# Patient Record
Sex: Male | Born: 1945
Health system: Southern US, Community
[De-identification: ages and names within clinical notes are randomized; demographics above are authoritative.]

## PROBLEM LIST (undated history)

## (undated) DIAGNOSIS — I2510 Atherosclerotic heart disease of native coronary artery without angina pectoris: Secondary | ICD-10-CM

## (undated) DIAGNOSIS — R42 Dizziness and giddiness: Secondary | ICD-10-CM

## (undated) DIAGNOSIS — M109 Gout, unspecified: Secondary | ICD-10-CM

## (undated) DIAGNOSIS — E669 Obesity, unspecified: Secondary | ICD-10-CM

## (undated) DIAGNOSIS — I10 Essential (primary) hypertension: Secondary | ICD-10-CM

## (undated) DIAGNOSIS — R011 Cardiac murmur, unspecified: Secondary | ICD-10-CM

## (undated) DIAGNOSIS — E119 Type 2 diabetes mellitus without complications: Secondary | ICD-10-CM

## (undated) DIAGNOSIS — E785 Hyperlipidemia, unspecified: Secondary | ICD-10-CM

## (undated) DIAGNOSIS — N529 Male erectile dysfunction, unspecified: Secondary | ICD-10-CM

## (undated) DIAGNOSIS — M1990 Unspecified osteoarthritis, unspecified site: Secondary | ICD-10-CM

## (undated) HISTORY — DX: Atherosclerotic heart disease of native coronary artery without angina pectoris: I25.10

## (undated) HISTORY — DX: Hyperlipidemia, unspecified: E78.5

## (undated) HISTORY — DX: Male erectile dysfunction, unspecified: N52.9

## (undated) HISTORY — DX: Obesity, unspecified: E66.9

## (undated) HISTORY — DX: Gout, unspecified: M10.9

## (undated) HISTORY — DX: Dizziness and giddiness: R42

## (undated) HISTORY — DX: Essential (primary) hypertension: I10

---

## 1977-11-14 HISTORY — PX: PILONIDAL CYST EXCISION: SHX744

## 1979-11-15 HISTORY — PX: CHOLECYSTECTOMY: SHX55

## 1999-11-15 HISTORY — PX: CORONARY ANGIOPLASTY WITH STENT PLACEMENT: SHX49

## 2013-11-14 HISTORY — PX: HERNIA REPAIR: SHX51

## 2014-05-22 ENCOUNTER — Encounter (INDEPENDENT_AMBULATORY_CARE_PROVIDER_SITE_OTHER): Payer: Self-pay | Admitting: Surgery

## 2014-06-10 ENCOUNTER — Ambulatory Visit (INDEPENDENT_AMBULATORY_CARE_PROVIDER_SITE_OTHER): Payer: BC Managed Care – PPO | Admitting: Surgery

## 2014-06-10 ENCOUNTER — Encounter (INDEPENDENT_AMBULATORY_CARE_PROVIDER_SITE_OTHER): Payer: Self-pay | Admitting: Surgery

## 2014-06-10 VITALS — BP 126/82 | HR 78 | Temp 97.0°F | Ht 70.0 in | Wt 290.0 lb

## 2014-06-10 DIAGNOSIS — K420 Umbilical hernia with obstruction, without gangrene: Secondary | ICD-10-CM | POA: Insufficient documentation

## 2014-06-10 DIAGNOSIS — K429 Umbilical hernia without obstruction or gangrene: Secondary | ICD-10-CM

## 2014-06-10 NOTE — Progress Notes (Signed)
General Surgery [facility]  Chief Complaint  Patient presents with  . New Evaluation    umbilical hernia - referral from Dr. Briscoe Deutscher    HISTORY: Patient is a 68 year old male referred by his primary care physician for evaluation of incarcerated umbilical hernia. Patient notes that this is been present for at least 4 years. He had previous surgery at the umbilicus related to his laparoscopic cholecystectomy from over 20 years ago. Over the past 4-5 years the patient has noted gradual enlargement of his umbilical hernia. Over the past year it has become no longer reducible. Patient has intermittent minor discomfort. No signs or symptoms of obstruction. He now presents for repair.  Past Medical History  Diagnosis Date  . CAD (coronary artery disease)   . Obesity   . Hyperlipidemia   . Hypertension   . Vertigo   . ED (erectile dysfunction)   . Gout     Current Outpatient Prescriptions  Medication Sig Dispense Refill  . aspirin 81 MG tablet Take 81 mg by mouth daily.      . Chromium 200 MCG CAPS Take by mouth.      Marland Kitchen econazole nitrate 1 % cream Apply topically daily.      . metoprolol succinate (TOPROL-XL) 50 MG 24 hr tablet Take 50 mg by mouth daily. Take with or immediately following a meal.      . nitroGLYCERIN (NITROSTAT) 0.4 MG SL tablet Place 0.4 mg under the tongue every 5 (five) minutes as needed for chest pain.      . rosuvastatin (CRESTOR) 10 MG tablet Take 10 mg by mouth daily.      . sildenafil (VIAGRA) 100 MG tablet Take 100 mg by mouth daily as needed for erectile dysfunction.      . valsartan-hydrochlorothiazide (DIOVAN-HCT) 160-25 MG per tablet Take 1 tablet by mouth daily.       No current facility-administered medications for this visit.    Allergies  Allergen Reactions  . Sulfa Antibiotics Hives    History reviewed. No pertinent family history.  History   Social History  . Marital Status: Married    Spouse Name: N/A    Number of  Children: N/A  . Years of Education: N/A   Social History Main Topics  . Smoking status: Former Research scientist (life sciences)  . Smokeless tobacco: None  . Alcohol Use: No  . Drug Use: No  . Sexual Activity: None   Other Topics Concern  . None   Social History Narrative  . None    REVIEW OF SYSTEMS - PERTINENT POSITIVES ONLY: Minimal discomfort. Not reducible. No signs of obstruction. No history of inguinal hernia.  EXAM: Filed Vitals:   06/10/14 1351  BP: 126/82  Pulse: 78  Temp: 97 F (36.1 C)    GENERAL: well-developed, well-nourished, no acute distress HEENT: normocephalic; pupils equal and reactive; sclerae clear; dentition good; mucous membranes moist NECK:  No palpable masses in the thyroid bed; symmetric on extension; no palpable anterior or posterior cervical lymphadenopathy; no supraclavicular masses; no tenderness CHEST: clear to auscultation bilaterally without rales, rhonchi, or wheezes CARDIAC: regular rate and rhythm without significant murmur; peripheral pulses are full ABDOMEN: soft without distension; bowel sounds present; no mass; no hepatosplenomegaly; obvious umbilical hernia of moderate size, not reducible, likely containing incarcerated omentum, difficult to estimate fascial defect but probably 3 cm in diameter EXT:  non-tender without edema; no deformity NEURO: no gross focal deficits; no sign of tremor   LABORATORY RESULTS: See  [facility] ([facility]) for most recent results  RADIOLOGY RESULTS: See [facility] ([place]) for most recent results  IMPRESSION: Incarcerated umbilical hernia  PLAN: I discussed the above findings with the patient. We reviewed records from his primary care physician. I have given him written literature to review at home.  I have recommended repair of umbilical hernia with mesh patch as an outpatient surgical procedure. We discussed the risk and benefits of the procedure. I explained the possibility of recurrence is less than 5%.  We discussed restrictions on his activities following the procedure. We will keep him out of work approximately 3 weeks following the procedure.  The risks and benefits of the procedure have been discussed at length with the patient.  The patient understands the proposed procedure, potential alternative treatments, and the course of recovery to be expected.  All of the patient's questions have been answered at this time.  The patient wishes to proceed with surgery.  Earnstine Regal, MD, [facility]  Primary Care Physician: Abigail Miyamoto, MD

## 2014-06-10 NOTE — Patient Instructions (Signed)
[facility]  HERNIA REPAIR POST OP INSTRUCTIONS  Always review your discharge instruction sheet given to you by the facility where your surgery was performed.  1. A  prescription for pain medication may be given to you upon discharge.  Take your pain medication as prescribed.  If narcotic pain medicine is not needed, then you may take acetaminophen (Tylenol) or ibuprofen (Advil) as needed.  2. Take your usually prescribed medications unless otherwise directed.  3. If you need a refill on your pain medication, please contact your pharmacy.  They will contact our office to request authorization. Prescriptions will not be filled after 5 pm daily or on weekends.  4. You should follow a light diet the first 24 hours after arrival home, such as soup and crackers or toast.  Be sure to include plenty of fluids daily.  Resume your normal diet the day after surgery.  5. Most patients will experience some swelling and bruising around the surgical site.  Ice packs and reclining will help.  Swelling and bruising can take several days to resolve.   6. It is common to experience some constipation if taking pain medication after surgery.  Increasing fluid intake and taking a stool softener (such as Colace) will usually help or prevent this problem from occurring.  A mild laxative (Milk of Magnesia or Miralax) should be taken according to package directions if there are no bowel movements after 48 hours.  7. Unless discharge instructions indicate otherwise, you may remove your bandages 24-48 hours after surgery, and you may shower at that time.  You may have steri-strips (small skin tapes) in place directly over the incision.  These strips should be left on the skin for 7-10 days.  If your surgeon used skin glue on the incision, you may shower in 24 hours.  The glue will flake off over the next 2-3 weeks.  Any sutures or staples will be removed at the office during your follow-up  visit.  8. ACTIVITIES:  You may resume regular (light) daily activities beginning the next day-such as daily self-care, walking, climbing stairs-gradually increasing activities as tolerated.  You may have sexual intercourse when it is comfortable.  Refrain from any heavy lifting or straining until approved by your doctor.  You may drive when you are no longer taking prescription pain medication, you can comfortably wear a seatbelt, and you can safely maneuver your car and apply brakes.  9. You should see your doctor in the office for a follow-up appointment approximately 2-3 weeks after your surgery.  Make sure that you call for this appointment within a day or two after you arrive home to insure a convenient appointment time. 10.   WHEN TO CALL YOUR DOCTOR: 1. Fever greater than 101.0 2. Inability to urinate 3. Persistent nausea and/or vomiting 4. Extreme swelling or bruising 5. Continued bleeding from incision 6. Increased pain, redness, or drainage from the incision  The clinic staff is available to answer your questions during regular business hours.  Please don't hesitate to call and ask to speak to one of the nurses for clinical concerns.  If you have a medical emergency, go to the nearest emergency room or call 911.  A surgeon from [facility] is always on call for the hospital.   [facility] [address], [address], [place], [place]  [address]  [phone] ? [phone] ? FAX [phone]  [email or web address]   

## 2014-07-10 ENCOUNTER — Telehealth (INDEPENDENT_AMBULATORY_CARE_PROVIDER_SITE_OTHER): Payer: Self-pay

## 2014-07-10 ENCOUNTER — Other Ambulatory Visit (INDEPENDENT_AMBULATORY_CARE_PROVIDER_SITE_OTHER): Payer: Self-pay

## 2014-07-10 DIAGNOSIS — K420 Umbilical hernia with obstruction, without gangrene: Secondary | ICD-10-CM

## 2014-07-10 MED ORDER — HYDROCODONE-ACETAMINOPHEN 5-325 MG PO TABS: 1.0000 | ORAL_TABLET | ORAL | Status: DC | PRN

## 2014-07-10 NOTE — Telephone Encounter (Signed)
LMOM with po appt date.

## 2014-07-14 ENCOUNTER — Telehealth (INDEPENDENT_AMBULATORY_CARE_PROVIDER_SITE_OTHER): Payer: Self-pay

## 2014-07-14 NOTE — Telephone Encounter (Signed)
Pt called stating with in the last 2 hours his right testicle is swelling and slightly painful. No fever. No change of color. Reviewed with Dr Harlow Asa. Per Dr Gala Lewandowsky request pt directed to go to his PCP Dr Maceo Pro to have this evaluated. Per Dr Harlow Asa pt advised this is not a typical complaint after umbilical hernia repair. Pt states he understands and will see Dr Maceo Pro.

## 2014-07-28 ENCOUNTER — Encounter (INDEPENDENT_AMBULATORY_CARE_PROVIDER_SITE_OTHER): Payer: BC Managed Care – PPO | Admitting: Surgery

## 2014-12-02 ENCOUNTER — Ambulatory Visit: Payer: Self-pay | Admitting: Physical Therapy

## 2014-12-04 ENCOUNTER — Ambulatory Visit: Payer: BLUE CROSS/BLUE SHIELD | Attending: Physician Assistant | Admitting: Physical Therapy

## 2014-12-04 DIAGNOSIS — R200 Anesthesia of skin: Secondary | ICD-10-CM | POA: Insufficient documentation

## 2014-12-04 DIAGNOSIS — M545 Low back pain: Secondary | ICD-10-CM | POA: Insufficient documentation

## 2014-12-16 ENCOUNTER — Ambulatory Visit: Payer: BLUE CROSS/BLUE SHIELD | Attending: Physician Assistant | Admitting: Physical Therapy

## 2014-12-16 DIAGNOSIS — R200 Anesthesia of skin: Secondary | ICD-10-CM | POA: Insufficient documentation

## 2014-12-16 DIAGNOSIS — M545 Low back pain: Secondary | ICD-10-CM | POA: Insufficient documentation

## 2014-12-18 ENCOUNTER — Ambulatory Visit: Payer: BLUE CROSS/BLUE SHIELD | Admitting: Physical Therapy

## 2014-12-18 DIAGNOSIS — M545 Low back pain: Secondary | ICD-10-CM | POA: Diagnosis not present

## 2014-12-23 ENCOUNTER — Ambulatory Visit: Payer: BLUE CROSS/BLUE SHIELD | Admitting: Physical Therapy

## 2014-12-23 DIAGNOSIS — M545 Low back pain: Secondary | ICD-10-CM | POA: Diagnosis not present

## 2014-12-30 ENCOUNTER — Encounter: Payer: BLUE CROSS/BLUE SHIELD | Admitting: Physical Therapy

## 2015-01-20 ENCOUNTER — Encounter: Payer: Self-pay | Admitting: *Deleted

## 2015-01-20 ENCOUNTER — Ambulatory Visit: Payer: BLUE CROSS/BLUE SHIELD | Attending: Physician Assistant | Admitting: *Deleted

## 2015-01-20 DIAGNOSIS — M545 Low back pain, unspecified: Secondary | ICD-10-CM

## 2015-01-20 DIAGNOSIS — R200 Anesthesia of skin: Secondary | ICD-10-CM | POA: Diagnosis not present

## 2015-01-20 NOTE — Therapy (Addendum)
[facility] [address] [place], [place], [address] Phone: [phone]   Fax:  [phone]  Physical Therapy Treatment  Patient Details  Name: John Wilson MRN: [id number] Date of Birth: 05/02/1946 Referring Provider:  Fried, Robert, MD  Encounter Date: 01/20/2015    Past Medical History:  Diagnosis Date  . CAD (coronary artery disease)   . ED (erectile dysfunction)   . Gout   . Hyperlipidemia   . Hypertension   . Obesity   . Vertigo     Past Surgical History:  Procedure Laterality Date  . NO PAST SURGERIES      There were no vitals taken for this visit.  Visit Diagnosis:  Right-sided low back pain without sciatica                               PT Long Term Goals - 01/20/15 1727      PT LONG TERM GOAL #1   Title Demonstrate and verbalize techniques to reduce the risk of re-injury to include info on physical activity   Status On-going     PT LONG TERM GOAL #2   Title Independent with advanced HEP   Status On-going     PT LONG TERM GOAL #3   Title Perform ADLs with pain not> 3/10   Status On-going     PT LONG TERM GOAL #4   Title tolerate standing for 20 mins with pain not >3/10   Status On-going     PT LONG TERM GOAL #5   Title Eliminate RT LE symptoms   Status On-going               Problem List Patient Active Problem List   Diagnosis Date Noted  . Incarcerated umbilical hernia 06/10/2014    APPLEGATE, CHAD, PTA 10/10/2016, 5:56 PM  [facility] [address] [place], [place], [address] Phone: [phone]   Fax:  [phone]  PHYSICAL THERAPY DISCHARGE SUMMARY  Visits from Start of Care: 5.  Current functional level related to goals / functional outcomes: Please see above.   Remaining deficits: Continued pain.   Education / Equipment: HEP. Plan: Patient agrees to discharge.  Patient goals were not met. Patient  is being discharged due to not returning since the last visit.  ?????         Chad Applegate MPT   

## 2015-01-27 ENCOUNTER — Encounter: Payer: BLUE CROSS/BLUE SHIELD | Admitting: *Deleted

## 2015-09-29 ENCOUNTER — Encounter: Payer: Self-pay | Admitting: Cardiology

## 2015-09-29 DIAGNOSIS — E119 Type 2 diabetes mellitus without complications: Secondary | ICD-10-CM | POA: Diagnosis not present

## 2015-09-29 DIAGNOSIS — Z125 Encounter for screening for malignant neoplasm of prostate: Secondary | ICD-10-CM | POA: Diagnosis not present

## 2015-09-29 DIAGNOSIS — Z0000 Encounter for general adult medical examination without abnormal findings: Secondary | ICD-10-CM | POA: Diagnosis not present

## 2015-09-29 DIAGNOSIS — I10 Essential (primary) hypertension: Secondary | ICD-10-CM | POA: Diagnosis not present

## 2015-09-29 DIAGNOSIS — Z23 Encounter for immunization: Secondary | ICD-10-CM | POA: Diagnosis not present

## 2015-09-29 DIAGNOSIS — I2510 Atherosclerotic heart disease of native coronary artery without angina pectoris: Secondary | ICD-10-CM | POA: Diagnosis not present

## 2015-09-29 DIAGNOSIS — L309 Dermatitis, unspecified: Secondary | ICD-10-CM | POA: Diagnosis not present

## 2015-10-01 ENCOUNTER — Other Ambulatory Visit (HOSPITAL_BASED_OUTPATIENT_CLINIC_OR_DEPARTMENT_OTHER): Payer: Self-pay | Admitting: Physician Assistant

## 2015-10-01 DIAGNOSIS — Z136 Encounter for screening for cardiovascular disorders: Secondary | ICD-10-CM

## 2015-10-07 ENCOUNTER — Ambulatory Visit (HOSPITAL_BASED_OUTPATIENT_CLINIC_OR_DEPARTMENT_OTHER)
Admission: RE | Admit: 2015-10-07 | Discharge: 2015-10-07 | Disposition: A | Discharge status: Home / Self Care | Payer: Medicare Other | Source: Ambulatory Visit | Attending: Physician Assistant | Admitting: Physician Assistant

## 2015-10-07 DIAGNOSIS — I77811 Abdominal aortic ectasia: Secondary | ICD-10-CM | POA: Diagnosis not present

## 2015-10-07 DIAGNOSIS — Z136 Encounter for screening for cardiovascular disorders: Secondary | ICD-10-CM | POA: Insufficient documentation

## 2015-11-15 DIAGNOSIS — J0140 Acute pansinusitis, unspecified: Secondary | ICD-10-CM | POA: Diagnosis not present

## 2015-11-15 DIAGNOSIS — H10023 Other mucopurulent conjunctivitis, bilateral: Secondary | ICD-10-CM | POA: Diagnosis not present

## 2015-11-23 DIAGNOSIS — L570 Actinic keratosis: Secondary | ICD-10-CM | POA: Diagnosis not present

## 2015-11-23 DIAGNOSIS — B354 Tinea corporis: Secondary | ICD-10-CM | POA: Diagnosis not present

## 2015-12-28 DIAGNOSIS — B354 Tinea corporis: Secondary | ICD-10-CM | POA: Diagnosis not present

## 2016-01-06 DIAGNOSIS — Z79899 Other long term (current) drug therapy: Secondary | ICD-10-CM | POA: Diagnosis not present

## 2016-02-08 DIAGNOSIS — Z79899 Other long term (current) drug therapy: Secondary | ICD-10-CM | POA: Diagnosis not present

## 2016-02-08 DIAGNOSIS — B354 Tinea corporis: Secondary | ICD-10-CM | POA: Diagnosis not present

## 2016-02-08 DIAGNOSIS — B351 Tinea unguium: Secondary | ICD-10-CM | POA: Diagnosis not present

## 2016-03-23 ENCOUNTER — Encounter: Payer: Self-pay | Admitting: Cardiology

## 2016-03-23 DIAGNOSIS — E119 Type 2 diabetes mellitus without complications: Secondary | ICD-10-CM | POA: Diagnosis not present

## 2016-03-23 DIAGNOSIS — E782 Mixed hyperlipidemia: Secondary | ICD-10-CM | POA: Diagnosis not present

## 2016-03-23 DIAGNOSIS — Z7984 Long term (current) use of oral hypoglycemic drugs: Secondary | ICD-10-CM | POA: Diagnosis not present

## 2016-03-23 DIAGNOSIS — I2510 Atherosclerotic heart disease of native coronary artery without angina pectoris: Secondary | ICD-10-CM | POA: Diagnosis not present

## 2016-03-23 DIAGNOSIS — I10 Essential (primary) hypertension: Secondary | ICD-10-CM | POA: Diagnosis not present

## 2016-04-13 DIAGNOSIS — B351 Tinea unguium: Secondary | ICD-10-CM | POA: Diagnosis not present

## 2016-04-13 DIAGNOSIS — Z79899 Other long term (current) drug therapy: Secondary | ICD-10-CM | POA: Diagnosis not present

## 2016-05-12 ENCOUNTER — Ambulatory Visit (INDEPENDENT_AMBULATORY_CARE_PROVIDER_SITE_OTHER): Payer: Medicare Other | Admitting: Cardiology

## 2016-05-12 ENCOUNTER — Encounter: Payer: Self-pay | Admitting: Cardiology

## 2016-05-12 VITALS — BP 146/80 | HR 50 | Ht 70.0 in | Wt 266.6 lb

## 2016-05-12 DIAGNOSIS — E785 Hyperlipidemia, unspecified: Secondary | ICD-10-CM

## 2016-05-12 DIAGNOSIS — I2510 Atherosclerotic heart disease of native coronary artery without angina pectoris: Secondary | ICD-10-CM | POA: Diagnosis not present

## 2016-05-12 DIAGNOSIS — I2583 Coronary atherosclerosis due to lipid rich plaque: Principal | ICD-10-CM

## 2016-05-12 DIAGNOSIS — R0789 Other chest pain: Secondary | ICD-10-CM | POA: Diagnosis not present

## 2016-05-12 DIAGNOSIS — I10 Essential (primary) hypertension: Secondary | ICD-10-CM | POA: Diagnosis not present

## 2016-05-12 NOTE — Progress Notes (Signed)
Cardiology Office Note    Date:  05/12/2016   ID:  John Wilson, DOB [birth year], MRN [id number]  PCP:  Beatris Si  Cardiologist:   Candee Furbish, MD     History of Present Illness:  John Wilson is a 70 y.o. male here for evaluation of coronary artery disease. Has a history of bare-metal stent to RCA, large dominant, and circumflex 2.5 x 13 mm in 2003 following a highly abnormal inferior wall stress test with ischemia. Was previously seen by a cardiologist in [place].  Has diabetes, hypertension, hyperlipidemia. I saw him previously approximately 6 years ago.  Overall doing well without any anginal symptoms, no syncopal, no bleeding, no orthopnea, no PND. However, he does sometimes has electrical shock, fleeting discomfort left flank lasting seconds duration.  No pain in left arm as prior angina.   He has several acres of land in [place].    Past Medical History  Diagnosis Date  . CAD (coronary artery disease)   . Obesity   . Hyperlipidemia   . Hypertension   . Vertigo   . ED (erectile dysfunction)   . Gout     Past Surgical History  Procedure Laterality Date  . No past surgeries      Current Medications: Outpatient Prescriptions Prior to Visit  Medication Sig Dispense Refill  . HYDROcodone-acetaminophen (NORCO) 5-325 MG per tablet Take 1-2 tablets by mouth every 4 (four) hours as needed for moderate pain or severe pain. 30 tablet 0  . metoprolol succinate (TOPROL-XL) 50 MG 24 hr tablet Take 50 mg by mouth daily. Take with or immediately following a meal.    . nitroGLYCERIN (NITROSTAT) 0.4 MG SL tablet Place 0.4 mg under the tongue every 5 (five) minutes as needed for chest pain.    . sildenafil (VIAGRA) 100 MG tablet Take 100 mg by mouth daily as needed for erectile dysfunction.    Marland Kitchen aspirin 81 MG tablet Take 81 mg by mouth daily.    . Chromium 200 MCG CAPS Take by mouth.    Marland Kitchen econazole nitrate 1 % cream Apply topically daily.    .  rosuvastatin (CRESTOR) 10 MG tablet Take 10 mg by mouth daily.    . valsartan-hydrochlorothiazide (DIOVAN-HCT) 160-25 MG per tablet Take 1 tablet by mouth daily.     No facility-administered medications prior to visit.     Allergies:   Sulfa antibiotics   Social History   Social History  . Marital Status: Married    Spouse Name: N/A  . Number of Children: N/A  . Years of Education: N/A   Social History Main Topics  . Smoking status: Former Research scientist (life sciences)  . Smokeless tobacco: None  . Alcohol Use: No  . Drug Use: No  . Sexual Activity: Not Asked   Other Topics Concern  . None   Social History Narrative     Family History:  No early family history of CAD   ROS:   Please see the history of present illness.   Positive for snoring ROS All other systems reviewed and are negative.   PHYSICAL EXAM:   VS:  BP 146/80 mmHg  Pulse 50  Ht 5\' 10"  (1.778 m)  Wt 266 lb 9.6 oz (120.929 kg)  BMI 38.25 kg/m2   GEN: Well nourished, well developed, in no acute distress HEENT: normal Neck: no JVD, carotid bruits, or masses Cardiac: RRR; no murmurs, rubs, or gallops,no edema  Respiratory:  clear to auscultation bilaterally, normal work of breathing  GI: soft, nontender, nondistended, + BS, overweight MS: no deformity or atrophy Skin: warm and dry, no rash Neuro:  Alert and Oriented x 3, Strength and sensation are intact Psych: euthymic mood, full affect  Wt Readings from Last 3 Encounters:  05/12/16 266 lb 9.6 oz (120.929 kg)  06/10/14 290 lb (131.543 kg)      Studies/Labs Reviewed:    EKG:   EKG was ordered today-05/12/16-sinus bradycardia rate 50 with no other significant abnormalities. Personally viewed-EKG personally reviewed from 09/29/15 shows sinus rhythm, poor R-wave progression/sinus bradycardia heart rate 52 with no other significant abnormalities.  Recent Labs: No results found for requested labs within last 365 days.   Lipid Panel No results found for: CHOL, TRIG, HDL,  CHOLHDL, VLDL, LDLCALC, LDLDIRECT  Additional studies/ records that were reviewed today include:   Hemoglobin A1c 6.5, creatinine 1.01, sodium 140 potassium 3.8, total cholesterol 179, HDL 41, LDL 104, triglycerides 169    ASSESSMENT:    1. Coronary artery disease due to lipid rich plaque   2. Atypical chest pain   3. Essential hypertension   4. Hyperlipidemia      PLAN:  In order of problems listed above:  Coronary artery disease  - Has both RCA as well as circumflex stent placed in 2003.  - Stable, no anginal like symptoms currently.  - He remembers that his RCA, a staged procedure, was challenging because of the bend. The operator had to place 4 different pieces of equipment he states to be successful.  - It would be reasonable for him to follow-up in 1 year monitor or to call sooner if needed.  Atypical chest pain  - Electrical-like left flank discomfort shooting upwards to left axilla. Does not sound cardiac in origin. Continue to monitor. If symptoms worsen or become more worrisome or remind him of the left arm discomfort that he was having as prior angina, we will have low threshold for stress test.  Essential hypertension  - Currently controlled, medications reviewed.  Hyperlipidemia  - Continue wiatorvastatin 40 mg a day.  Diabetes  - On metformin 500 mg twice a day    Medication Adjustments/Labs and Tests Ordered: Current medicines are reviewed at length with the patient today.  Concerns regarding medicines are outlined above.  Medication changes, Labs and Tests ordered today are listed in the Patient Instructions below. Patient Instructions  Medication Instructions:  The current medical regimen is effective;  continue present plan and medications.  Follow-Up: Follow up in 1 year with Dr. Marlou Porch.  You will receive a letter in the mail 2 months before you are due.  Please call us when you receive this letter to schedule your follow up appointment.  If you  need a refill on your cardiac medications before your next appointment, please call your pharmacy.  Thank you for choosing [facility]!!           Signed, Candee Furbish, MD  05/12/2016 11:49 AM    [address] [place], [place],   [address] Phone: [phone]; Fax: [phone]

## 2016-05-12 NOTE — Patient Instructions (Signed)

## 2016-06-14 DIAGNOSIS — H8112 Benign paroxysmal vertigo, left ear: Secondary | ICD-10-CM | POA: Diagnosis not present

## 2016-10-20 DIAGNOSIS — I10 Essential (primary) hypertension: Secondary | ICD-10-CM | POA: Diagnosis not present

## 2016-10-20 DIAGNOSIS — M542 Cervicalgia: Secondary | ICD-10-CM | POA: Diagnosis not present

## 2016-10-20 DIAGNOSIS — I2510 Atherosclerotic heart disease of native coronary artery without angina pectoris: Secondary | ICD-10-CM | POA: Diagnosis not present

## 2016-10-20 DIAGNOSIS — E119 Type 2 diabetes mellitus without complications: Secondary | ICD-10-CM | POA: Diagnosis not present

## 2016-10-20 DIAGNOSIS — Z125 Encounter for screening for malignant neoplasm of prostate: Secondary | ICD-10-CM | POA: Diagnosis not present

## 2016-11-21 DIAGNOSIS — B354 Tinea corporis: Secondary | ICD-10-CM | POA: Diagnosis not present

## 2017-04-19 DIAGNOSIS — Z794 Long term (current) use of insulin: Secondary | ICD-10-CM | POA: Diagnosis not present

## 2017-04-19 DIAGNOSIS — I10 Essential (primary) hypertension: Secondary | ICD-10-CM | POA: Diagnosis not present

## 2017-04-19 DIAGNOSIS — I2510 Atherosclerotic heart disease of native coronary artery without angina pectoris: Secondary | ICD-10-CM | POA: Diagnosis not present

## 2017-04-19 DIAGNOSIS — E119 Type 2 diabetes mellitus without complications: Secondary | ICD-10-CM | POA: Diagnosis not present

## 2017-04-19 DIAGNOSIS — E782 Mixed hyperlipidemia: Secondary | ICD-10-CM | POA: Diagnosis not present

## 2017-04-19 DIAGNOSIS — Z0000 Encounter for general adult medical examination without abnormal findings: Secondary | ICD-10-CM | POA: Diagnosis not present

## 2017-05-16 DIAGNOSIS — H6591 Unspecified nonsuppurative otitis media, right ear: Secondary | ICD-10-CM | POA: Diagnosis not present

## 2017-05-16 DIAGNOSIS — R42 Dizziness and giddiness: Secondary | ICD-10-CM | POA: Diagnosis not present

## 2017-05-16 DIAGNOSIS — Z1159 Encounter for screening for other viral diseases: Secondary | ICD-10-CM | POA: Diagnosis not present

## 2017-05-22 DIAGNOSIS — H6981 Other specified disorders of Eustachian tube, right ear: Secondary | ICD-10-CM | POA: Diagnosis not present

## 2017-05-22 DIAGNOSIS — Z1159 Encounter for screening for other viral diseases: Secondary | ICD-10-CM | POA: Diagnosis not present

## 2017-06-14 DIAGNOSIS — R6884 Jaw pain: Secondary | ICD-10-CM | POA: Diagnosis not present

## 2017-07-03 IMAGING — US US AORTA SCREENING (MEDICARE)
1 series · 14 of 14 positions shown · non-contrast
Comparison: None.

CLINICAL DATA: Medicare screening exam for abdominal aortic
aneurysm.

EXAM:
ABDOMINAL AORTA SCREENING ULTRASOUND
TECHNIQUE: Ultrasound examination of the abdominal aorta was performed as a
screening evaluation for abdominal aortic aneurysm.

[Series 1: us aorta screening (medicare) · 0.31mm/px · 14 of 14 slices shown]
[im 1/14]
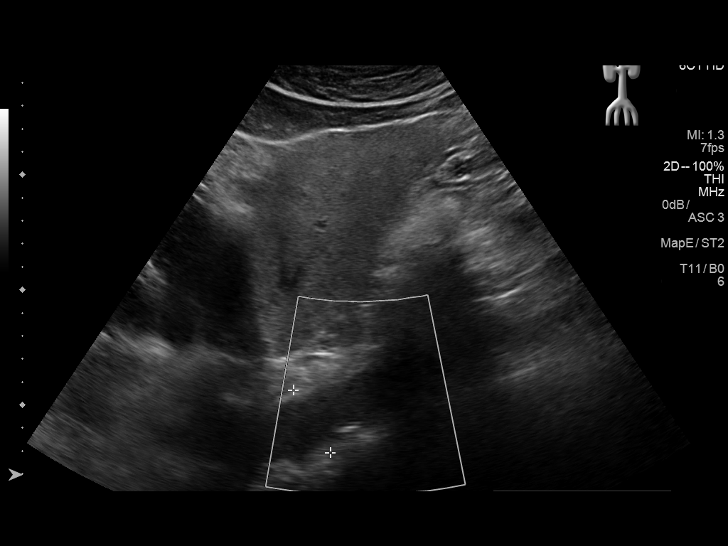
[im 2/14]
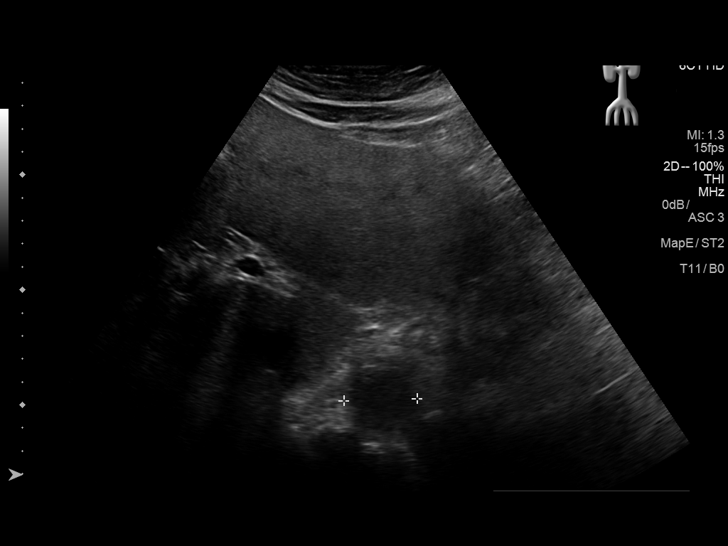
[im 3/14]
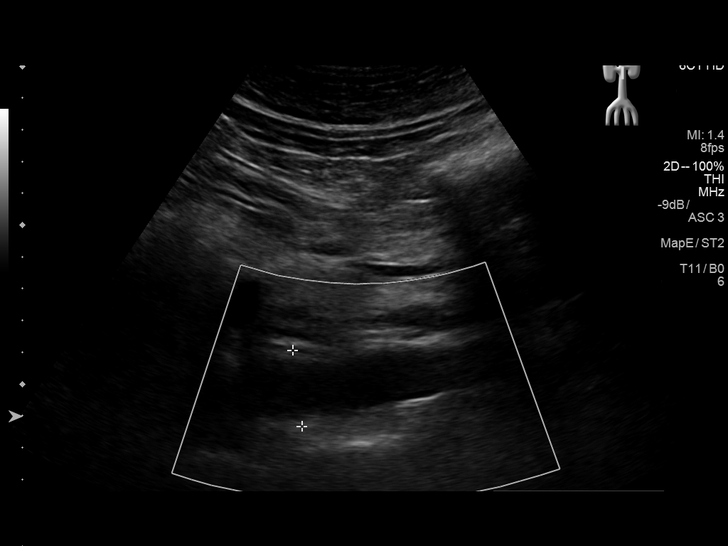
[im 4/14]
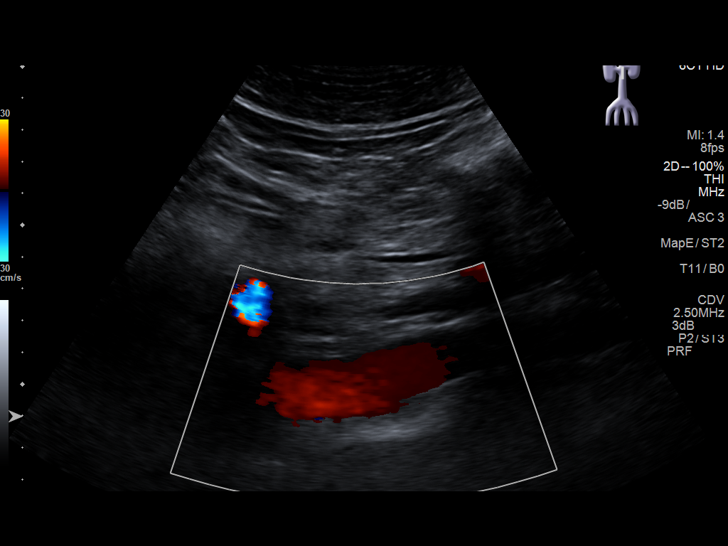
[im 5/14]
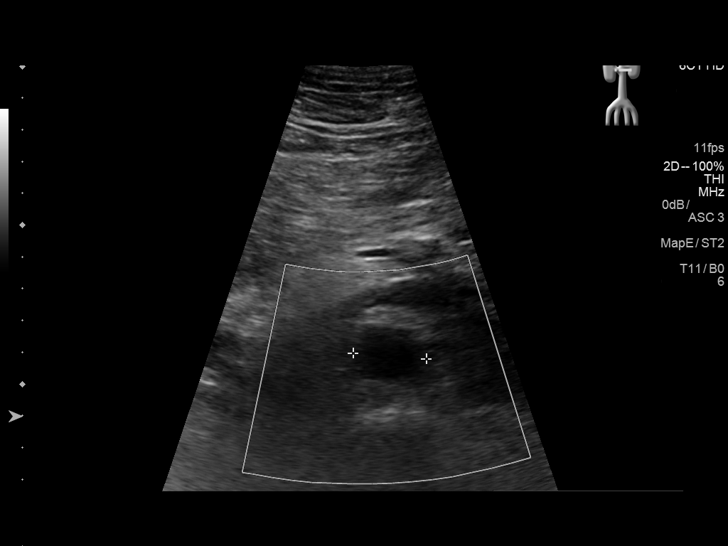
[im 6/14]
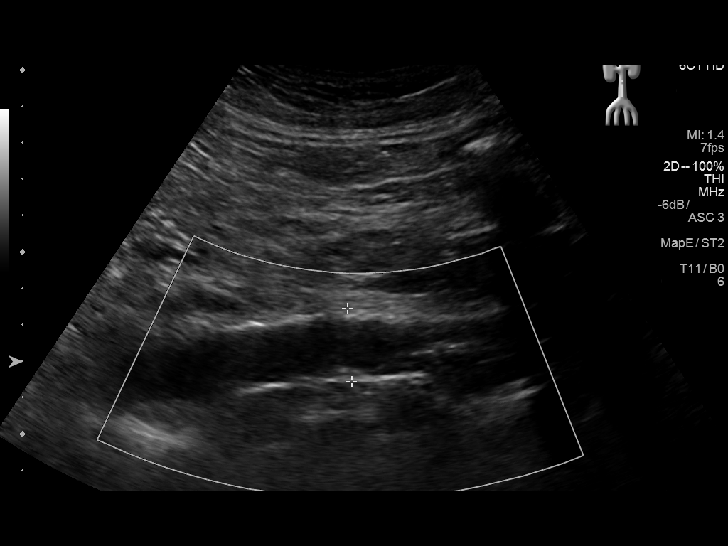
[im 7/14]
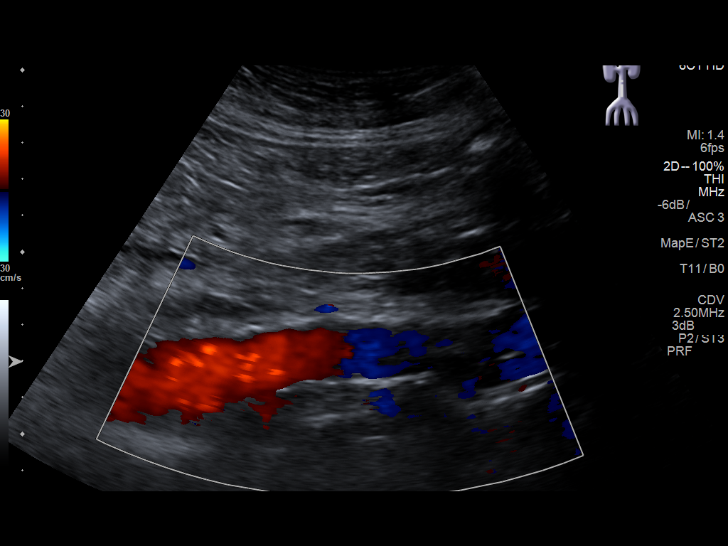
[im 8/14]
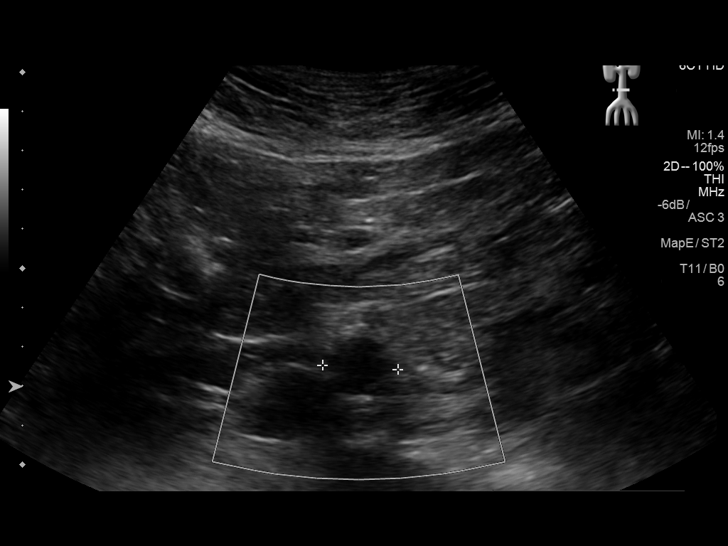
[im 9/14]
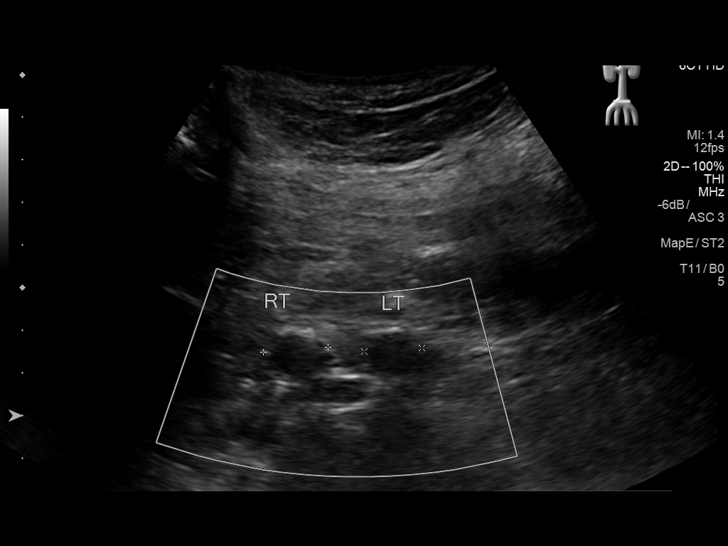
[im 10/14]
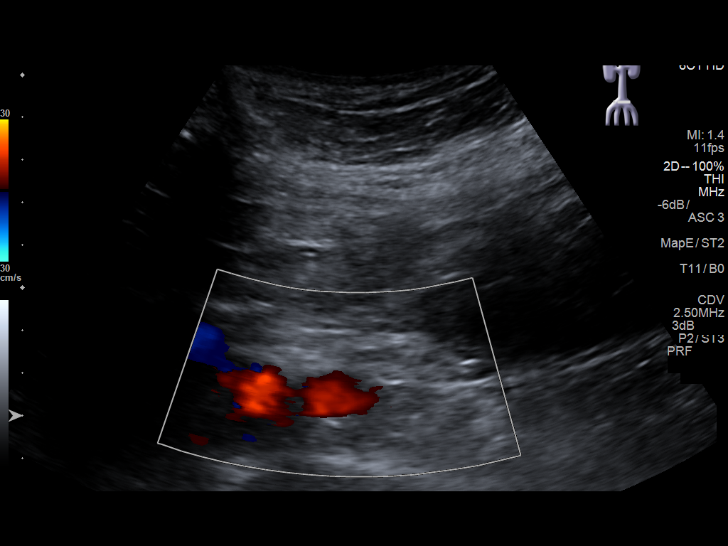
[im 11/14]
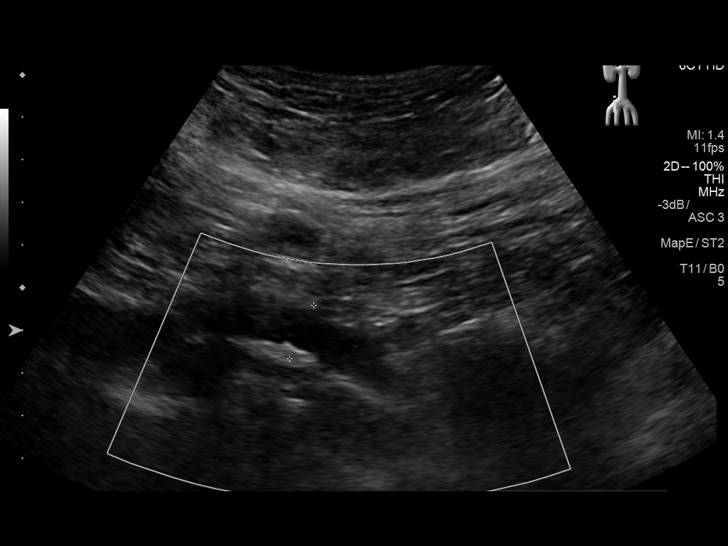
[im 12/14]
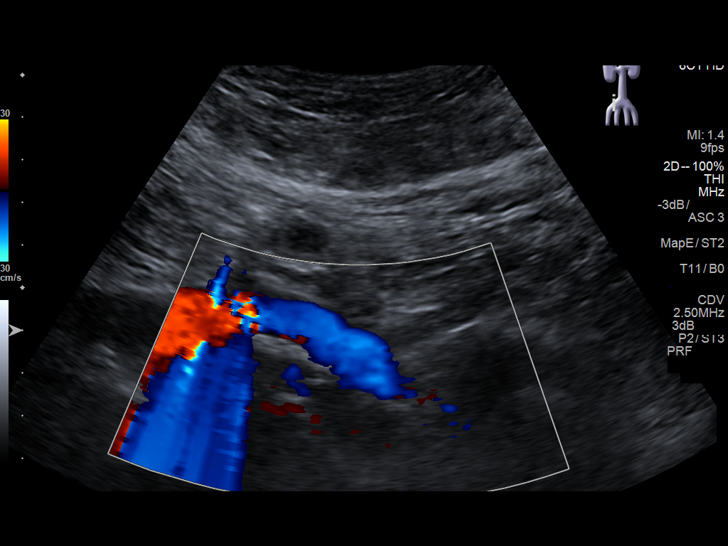
[im 13/14]
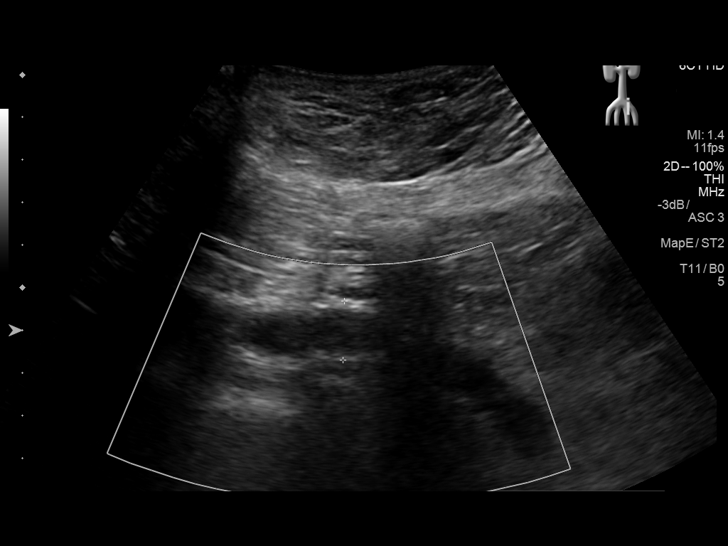
[im 14/14]
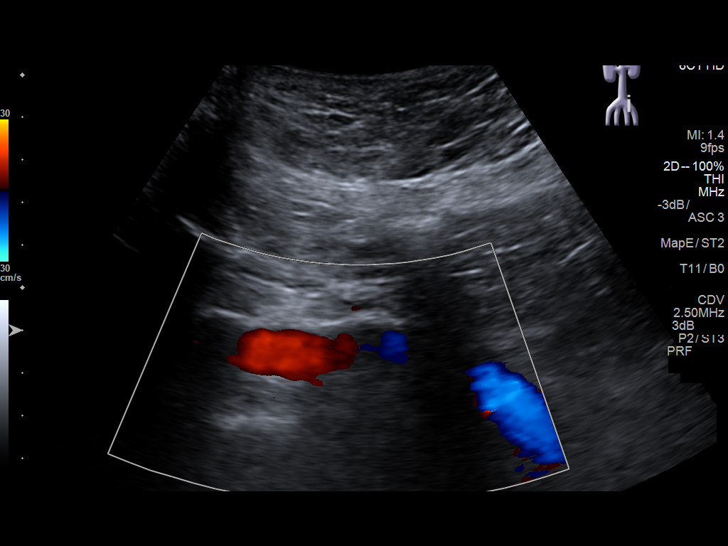

[14 of 14 positions shown; findings below may reference images not displayed]

FINDINGS: Abdominal Aorta

Mild abdominal aortic ectasia 3.2 cm

Maximum Diameter: 3.2 cm
IMPRESSION: Mild abdominal aortic ectasia at 3.2 cm. Recommend followup by
ultrasound in 3 years. This recommendation follows ACR consensus
guidelines: White Paper of the ACR Incidental Findings Committee II

## 2017-10-17 DIAGNOSIS — Z6836 Body mass index (BMI) 36.0-36.9, adult: Secondary | ICD-10-CM | POA: Diagnosis not present

## 2017-10-17 DIAGNOSIS — I10 Essential (primary) hypertension: Secondary | ICD-10-CM | POA: Diagnosis not present

## 2017-10-17 DIAGNOSIS — E782 Mixed hyperlipidemia: Secondary | ICD-10-CM | POA: Diagnosis not present

## 2017-10-17 DIAGNOSIS — E119 Type 2 diabetes mellitus without complications: Secondary | ICD-10-CM | POA: Diagnosis not present

## 2017-10-17 DIAGNOSIS — Z7984 Long term (current) use of oral hypoglycemic drugs: Secondary | ICD-10-CM | POA: Diagnosis not present

## 2017-10-17 DIAGNOSIS — I2510 Atherosclerotic heart disease of native coronary artery without angina pectoris: Secondary | ICD-10-CM | POA: Diagnosis not present

## 2017-11-03 DIAGNOSIS — E119 Type 2 diabetes mellitus without complications: Secondary | ICD-10-CM | POA: Diagnosis not present

## 2017-12-13 DIAGNOSIS — H52229 Regular astigmatism, unspecified eye: Secondary | ICD-10-CM | POA: Diagnosis not present

## 2017-12-18 DIAGNOSIS — Z0100 Encounter for examination of eyes and vision without abnormal findings: Secondary | ICD-10-CM | POA: Diagnosis not present

## 2017-12-27 DIAGNOSIS — R6889 Other general symptoms and signs: Secondary | ICD-10-CM | POA: Diagnosis not present

## 2018-04-10 DIAGNOSIS — I10 Essential (primary) hypertension: Secondary | ICD-10-CM | POA: Diagnosis not present

## 2018-04-10 DIAGNOSIS — E782 Mixed hyperlipidemia: Secondary | ICD-10-CM | POA: Diagnosis not present

## 2018-04-10 DIAGNOSIS — H6981 Other specified disorders of Eustachian tube, right ear: Secondary | ICD-10-CM | POA: Diagnosis not present

## 2018-04-10 DIAGNOSIS — I2510 Atherosclerotic heart disease of native coronary artery without angina pectoris: Secondary | ICD-10-CM | POA: Diagnosis not present

## 2018-04-10 DIAGNOSIS — Z7984 Long term (current) use of oral hypoglycemic drugs: Secondary | ICD-10-CM | POA: Diagnosis not present

## 2018-04-10 DIAGNOSIS — M542 Cervicalgia: Secondary | ICD-10-CM | POA: Diagnosis not present

## 2018-04-10 DIAGNOSIS — E119 Type 2 diabetes mellitus without complications: Secondary | ICD-10-CM | POA: Diagnosis not present

## 2018-04-10 DIAGNOSIS — Z6836 Body mass index (BMI) 36.0-36.9, adult: Secondary | ICD-10-CM | POA: Diagnosis not present

## 2018-05-11 DIAGNOSIS — M10072 Idiopathic gout, left ankle and foot: Secondary | ICD-10-CM | POA: Diagnosis not present

## 2018-10-10 ENCOUNTER — Other Ambulatory Visit (HOSPITAL_BASED_OUTPATIENT_CLINIC_OR_DEPARTMENT_OTHER): Payer: Self-pay | Admitting: Physician Assistant

## 2018-10-10 DIAGNOSIS — I77811 Abdominal aortic ectasia: Principal | ICD-10-CM

## 2018-10-10 DIAGNOSIS — I2510 Atherosclerotic heart disease of native coronary artery without angina pectoris: Secondary | ICD-10-CM | POA: Diagnosis not present

## 2018-10-10 DIAGNOSIS — E119 Type 2 diabetes mellitus without complications: Secondary | ICD-10-CM | POA: Diagnosis not present

## 2018-10-10 DIAGNOSIS — E782 Mixed hyperlipidemia: Secondary | ICD-10-CM | POA: Diagnosis not present

## 2018-10-10 DIAGNOSIS — I7789 Other specified disorders of arteries and arterioles: Secondary | ICD-10-CM

## 2018-10-10 DIAGNOSIS — Z0000 Encounter for general adult medical examination without abnormal findings: Secondary | ICD-10-CM | POA: Diagnosis not present

## 2018-10-10 DIAGNOSIS — K5900 Constipation, unspecified: Secondary | ICD-10-CM | POA: Diagnosis not present

## 2018-10-10 DIAGNOSIS — I10 Essential (primary) hypertension: Secondary | ICD-10-CM | POA: Diagnosis not present

## 2018-12-12 ENCOUNTER — Encounter (HOSPITAL_BASED_OUTPATIENT_CLINIC_OR_DEPARTMENT_OTHER): Payer: Self-pay

## 2018-12-12 ENCOUNTER — Ambulatory Visit (HOSPITAL_BASED_OUTPATIENT_CLINIC_OR_DEPARTMENT_OTHER)
Admission: RE | Admit: 2018-12-12 | Discharge: 2018-12-12 | Disposition: A | Discharge status: Home / Self Care | Payer: Medicare HMO | Source: Ambulatory Visit | Attending: Physician Assistant | Admitting: Physician Assistant

## 2018-12-12 DIAGNOSIS — I77811 Abdominal aortic ectasia: Secondary | ICD-10-CM | POA: Diagnosis not present

## 2018-12-12 DIAGNOSIS — I714 Abdominal aortic aneurysm, without rupture: Secondary | ICD-10-CM | POA: Diagnosis not present

## 2018-12-12 DIAGNOSIS — I7789 Other specified disorders of arteries and arterioles: Secondary | ICD-10-CM

## 2018-12-12 HISTORY — DX: Type 2 diabetes mellitus without complications: E11.9

## 2018-12-25 ENCOUNTER — Other Ambulatory Visit (HOSPITAL_COMMUNITY): Payer: Self-pay | Admitting: Physician Assistant

## 2018-12-25 ENCOUNTER — Other Ambulatory Visit (HOSPITAL_BASED_OUTPATIENT_CLINIC_OR_DEPARTMENT_OTHER): Payer: Self-pay | Admitting: Physician Assistant

## 2018-12-25 ENCOUNTER — Ambulatory Visit (HOSPITAL_COMMUNITY)
Admission: RE | Admit: 2018-12-25 | Discharge: 2018-12-25 | Disposition: A | Discharge status: Home / Self Care | Payer: Medicare HMO | Source: Ambulatory Visit | Attending: Physician Assistant | Admitting: Physician Assistant

## 2018-12-25 DIAGNOSIS — M7989 Other specified soft tissue disorders: Secondary | ICD-10-CM

## 2018-12-25 DIAGNOSIS — R0989 Other specified symptoms and signs involving the circulatory and respiratory systems: Secondary | ICD-10-CM

## 2018-12-25 DIAGNOSIS — R600 Localized edema: Secondary | ICD-10-CM | POA: Diagnosis not present

## 2018-12-26 ENCOUNTER — Ambulatory Visit (HOSPITAL_COMMUNITY)
Admission: RE | Admit: 2018-12-26 | Discharge: 2018-12-26 | Disposition: A | Discharge status: Home / Self Care | Payer: Medicare HMO | Source: Ambulatory Visit | Attending: Physician Assistant | Admitting: Physician Assistant

## 2018-12-26 ENCOUNTER — Ambulatory Visit (HOSPITAL_BASED_OUTPATIENT_CLINIC_OR_DEPARTMENT_OTHER)
Admission: RE | Admit: 2018-12-26 | Discharge: 2018-12-26 | Disposition: A | Discharge status: Home / Self Care | Payer: Medicare HMO | Source: Ambulatory Visit | Attending: Physician Assistant | Admitting: Physician Assistant

## 2018-12-26 ENCOUNTER — Encounter (HOSPITAL_COMMUNITY): Payer: Self-pay

## 2018-12-26 DIAGNOSIS — M7989 Other specified soft tissue disorders: Secondary | ICD-10-CM | POA: Diagnosis not present

## 2018-12-26 DIAGNOSIS — R0989 Other specified symptoms and signs involving the circulatory and respiratory systems: Secondary | ICD-10-CM

## 2018-12-26 NOTE — Progress Notes (Signed)
ABI's have been completed. Preliminary results can be found in CV Proc through chart review.  Results were given to Mat Carne PA.  12/26/18 10:55 AM John Wilson RVT

## 2019-01-02 ENCOUNTER — Other Ambulatory Visit: Payer: Self-pay | Admitting: Podiatry

## 2019-01-02 ENCOUNTER — Ambulatory Visit (INDEPENDENT_AMBULATORY_CARE_PROVIDER_SITE_OTHER): Payer: Medicare HMO

## 2019-01-02 ENCOUNTER — Ambulatory Visit: Payer: Medicare HMO | Admitting: Podiatry

## 2019-01-02 ENCOUNTER — Encounter: Payer: Self-pay | Admitting: Podiatry

## 2019-01-02 VITALS — BP 163/75

## 2019-01-02 DIAGNOSIS — M109 Gout, unspecified: Secondary | ICD-10-CM

## 2019-01-02 DIAGNOSIS — M79671 Pain in right foot: Secondary | ICD-10-CM | POA: Diagnosis not present

## 2019-01-02 DIAGNOSIS — M7751 Other enthesopathy of right foot: Secondary | ICD-10-CM

## 2019-01-02 DIAGNOSIS — M779 Enthesopathy, unspecified: Secondary | ICD-10-CM

## 2019-01-02 DIAGNOSIS — M7671 Peroneal tendinitis, right leg: Secondary | ICD-10-CM

## 2019-01-02 MED ORDER — TRIAMCINOLONE ACETONIDE 10 MG/ML IJ SUSP
10.0000 mg | Freq: Once | INTRAMUSCULAR | Status: AC
  Administered 2019-01-02: 10 mg

## 2019-01-02 MED ORDER — METHYLPREDNISOLONE 4 MG PO TBPK: ORAL_TABLET | ORAL | 0 refills | Status: DC

## 2019-01-02 NOTE — Progress Notes (Signed)
Subjective:   Patient ID: John Wilson, male   DOB: 73 y.o.   MRN: [id number]   HPI Patient presents with acute inflammation of the dorsum of the right second metatarsal joint and the lateral side of the foot and 2 separate areas and stated that he had had this kind of inflammation last year on his left foot.  States is been very tender and making it hard to be active   Review of Systems  All other systems reviewed and are negative.       Objective:  Physical Exam Vitals signs and nursing note reviewed.  Constitutional:      Appearance: He is well-developed.  Pulmonary:     Effort: Pulmonary effort is normal.  Musculoskeletal: Normal range of motion.  Skin:    General: Skin is warm.  Neurological:     Mental Status: He is alert.     Neurovascular status intact muscle strength is adequate range of motion within normal limits with patient found to have inflammation fluid buildup of the second metatarsal phalangeal joint right that is painful when palpated and is noted on the lateral side of the foot to have inflammation of the tendon complex.  Does appear to possibly have a systemic condition here secondary to the type of inflammation present and it is localized with no left foot involvement currently     Assessment:  Acute capsulitis second MPJ right with tendinitis lateral side right foot and also possibility for other pathology of a systemic nature     Plan:  H&P x-ray reviewed condition discussed.  I did do a proximal nerve block of the right forefoot and then aspirated the joint finding the fluid to be clear.  I then went ahead and injected quarter cc dexamethasone Kenalog and then did a tendinous injection of the lateral side of the foot 3 mg Kenalog 5 mg Xylocaine and advised on ice therapy and compression.  I did send for blood work to try to rule out any systemic inflammatory disease and I placed on Medrol Dosepak  X-ray indicated no signs of fracture or other bone  pathology currently

## 2019-01-03 LAB — RHEUMATOID FACTOR: Rhuematoid fact SerPl-aCnc: <14 IU/mL (ref <14)

## 2019-01-03 LAB — SEDIMENTATION RATE: SED RATE: 6 mm/h (ref 0–20)

## 2019-01-03 LAB — ANA, IFA COMPREHENSIVE PANEL
ANA: NEGATIVE
DS DNA AB: 1 [IU]/mL
ENA SM AB SER-ACNC: NEGATIVE AI
SM/RNP: NEGATIVE AI
SSA (Ro) (ENA) Antibody, IgG: <1 AI
SSB (La) (ENA) Antibody, IgG: <1 AI
Scleroderma (Scl-70) (ENA) Antibody, IgG: <1 AI

## 2019-01-03 LAB — URIC ACID: Uric Acid, Serum: 6 mg/dL (ref 4.0–8.0)

## 2019-01-03 LAB — C-REACTIVE PROTEIN: CRP: 3.5 mg/L (ref <8.0)

## 2019-01-10 ENCOUNTER — Ambulatory Visit: Payer: Medicare HMO | Admitting: Podiatry

## 2019-01-10 ENCOUNTER — Encounter: Payer: Self-pay | Admitting: Podiatry

## 2019-01-10 DIAGNOSIS — M7671 Peroneal tendinitis, right leg: Secondary | ICD-10-CM

## 2019-01-10 DIAGNOSIS — M779 Enthesopathy, unspecified: Secondary | ICD-10-CM

## 2019-01-10 DIAGNOSIS — M109 Gout, unspecified: Secondary | ICD-10-CM | POA: Diagnosis not present

## 2019-01-10 DIAGNOSIS — R600 Localized edema: Secondary | ICD-10-CM | POA: Diagnosis not present

## 2019-01-10 NOTE — Progress Notes (Signed)
Subjective:   Patient ID: John Wilson, male   DOB: 73 y.o.   MRN: [id number]   HPI Patient states that he still getting a lot of discomfort in the left forefoot but it has improved some in the midfoot is dramatically improved   ROS      Objective:  Physical Exam  Neurovascular status intact with significant discomfort still noted second MPJ right and reduced inflammation pain within the midfoot right with blood work that did not indicate this is a systemic condition.  It is still quite sore and he states it is worse when he weight bears on the foot     Assessment:  Inflammatory condition with consistent process associated with a localized inflammatory condition with quite a bit of pain still noted and swelling in the midfoot     Plan:  H&P spent a great deal time going over blood work and discussing condition.  At this point I went ahead and I applied an Unna boot Ace wrap to try to reduce the swelling process and dispensed a ankle compression stocking to use when the boot is removed.  I did apply her fracture walker to completely immobilize  X-ray indicates that there is no indications of pathology from the previous visit

## 2019-01-14 ENCOUNTER — Ambulatory Visit (INDEPENDENT_AMBULATORY_CARE_PROVIDER_SITE_OTHER): Payer: Medicare HMO

## 2019-01-14 ENCOUNTER — Other Ambulatory Visit: Payer: Self-pay | Admitting: Podiatry

## 2019-01-14 ENCOUNTER — Encounter: Payer: Self-pay | Admitting: Podiatry

## 2019-01-14 ENCOUNTER — Telehealth: Payer: Self-pay | Admitting: *Deleted

## 2019-01-14 ENCOUNTER — Ambulatory Visit: Payer: Medicare HMO | Admitting: Podiatry

## 2019-01-14 DIAGNOSIS — S92501A Displaced unspecified fracture of right lesser toe(s), initial encounter for closed fracture: Secondary | ICD-10-CM

## 2019-01-14 DIAGNOSIS — M79671 Pain in right foot: Secondary | ICD-10-CM

## 2019-01-14 DIAGNOSIS — M109 Gout, unspecified: Secondary | ICD-10-CM | POA: Diagnosis not present

## 2019-01-14 DIAGNOSIS — M779 Enthesopathy, unspecified: Secondary | ICD-10-CM

## 2019-01-14 NOTE — Telephone Encounter (Signed)
Pt presented to the office stating he was seen today and was to have a prescription sent to his [facility] in [place].

## 2019-01-15 ENCOUNTER — Telehealth: Payer: Self-pay | Admitting: Podiatry

## 2019-01-15 LAB — CBC WITH DIFFERENTIAL/PLATELET
Absolute Monocytes: 638 cells/uL (ref 200–950)
BASOS PCT: 0.4 %
Basophils Absolute: 45 cells/uL (ref 0–200)
EOS ABS: 246 {cells}/uL (ref 15–500)
Eosinophils Relative: 2.2 %
HCT: 45.5 % (ref 38.5–50.0)
HEMOGLOBIN: 15.8 g/dL (ref 13.2–17.1)
Lymphs Abs: 2912 cells/uL (ref 850–3900)
MCH: 30.7 pg (ref 27.0–33.0)
MCHC: 34.7 g/dL (ref 32.0–36.0)
MCV: 88.5 fL (ref 80.0–100.0)
MPV: 9.4 fL (ref 7.5–12.5)
Monocytes Relative: 5.7 %
Neutro Abs: 7358 cells/uL (ref 1500–7800)
Neutrophils Relative %: 65.7 %
Platelets: 197 10*3/uL (ref 140–400)
RBC: 5.14 10*6/uL (ref 4.20–5.80)
RDW: 13.8 % (ref 11.0–15.0)
TOTAL LYMPHOCYTE: 26 %
WBC: 11.2 10*3/uL — ABNORMAL HIGH (ref 3.8–10.8)

## 2019-01-15 LAB — SEDIMENTATION RATE: Sed Rate: 2 mm/h (ref 0–20)

## 2019-01-15 NOTE — Telephone Encounter (Signed)
Pt presented to the office stating he was seen today and was to have a prescription sent to his [facility] in [place].

## 2019-01-16 MED ORDER — TRAMADOL HCL 50 MG PO TABS: 50.0000 mg | ORAL_TABLET | Freq: Three times a day (TID) | ORAL | 0 refills | Status: DC | PRN

## 2019-01-16 NOTE — Progress Notes (Signed)
Subjective:   Patient ID: John Wilson, male   DOB: 73 y.o.   MRN: [id number]   HPI Patient states the pain is really centralized around the second and third digits right in the midfoot continues to feel good.  States is been red and was only able to wear the Unna boot for a short period of time and feels better in the boot but it is hard for him to wear all the time   ROS      Objective:  Physical Exam  Neurovascular status intact with quite a bit of inflammation around the second and third MPJ right that is localized with no significant midfoot inflammation noted currently but there is some redness in the forefoot that is local with no drainage or indications of portal of entry     Assessment:  Difficult to make determination between infection or inflammation which may be occurring with this particular case     Plan:  Re-x-ray the foot and discussed possibility for fracture and we will continue boot at the current time and I went ahead today and I am sending for CBC with differential and repeat of sed rate.  This is a frustrating case but I do think there is a fracture of the base of the second digit and I did review the case with Dr. Carman Ching and both of [place] agree it is most likely not bone infection but cannot be ruled out  X-ray indicates that there is what appears to be a fracture of the base of the second digit right lateral side that is local to this area

## 2019-01-16 NOTE — Telephone Encounter (Signed)
I informed pt Dr. Paulla Dolly had ordered Tramadol with [facility]. Orders called to [facility].

## 2019-01-16 NOTE — Telephone Encounter (Signed)
Should be tramadol to help with pain

## 2019-01-18 NOTE — Telephone Encounter (Signed)
He should have received prescription for tramadol

## 2019-01-25 ENCOUNTER — Encounter: Payer: Self-pay | Admitting: Podiatry

## 2019-01-25 ENCOUNTER — Ambulatory Visit: Payer: Medicare HMO | Admitting: Podiatry

## 2019-01-25 ENCOUNTER — Other Ambulatory Visit: Payer: Self-pay

## 2019-01-25 DIAGNOSIS — M109 Gout, unspecified: Secondary | ICD-10-CM | POA: Diagnosis not present

## 2019-01-25 DIAGNOSIS — M779 Enthesopathy, unspecified: Secondary | ICD-10-CM

## 2019-01-28 NOTE — Progress Notes (Signed)
Subjective:   Patient ID: John Wilson, male   DOB: 73 y.o.   MRN: [id number]   HPI Patient presents stating improved over where I was but it still is quite sore if I do a lot of walking   ROS      Objective:  Physical Exam  Neurovascular status intact with inflammation of the second MPJ right still present with improvement but pain upon deep palpation     Assessment:  Difficult to rule out any form of systemic versus localized process with her blood work inconclusive     Plan:  H&P and educated him on this and the different conditions which could be present.  At this point I have recommended rigid bottom shoes anti-inflammatories and the possibility that surgical intervention may be necessary at one point in future but I will reevaluate again in the next 3 weeks and decide what may be necessary

## 2019-01-31 ENCOUNTER — Ambulatory Visit: Payer: Medicare HMO | Admitting: Podiatry

## 2019-02-25 ENCOUNTER — Ambulatory Visit (INDEPENDENT_AMBULATORY_CARE_PROVIDER_SITE_OTHER): Payer: Medicare HMO

## 2019-02-25 ENCOUNTER — Other Ambulatory Visit: Payer: Self-pay

## 2019-02-25 ENCOUNTER — Encounter: Payer: Self-pay | Admitting: Podiatry

## 2019-02-25 ENCOUNTER — Other Ambulatory Visit: Payer: Self-pay | Admitting: Podiatry

## 2019-02-25 ENCOUNTER — Ambulatory Visit: Payer: Medicare HMO | Admitting: Podiatry

## 2019-02-25 VITALS — Temp 97.9°F

## 2019-02-25 DIAGNOSIS — S92501A Displaced unspecified fracture of right lesser toe(s), initial encounter for closed fracture: Secondary | ICD-10-CM

## 2019-02-25 DIAGNOSIS — M779 Enthesopathy, unspecified: Secondary | ICD-10-CM

## 2019-02-25 DIAGNOSIS — R600 Localized edema: Secondary | ICD-10-CM

## 2019-02-25 NOTE — Progress Notes (Signed)
Subjective:   Patient ID: [name], male   DOB: 73 y.o.   MRN: [id number]   HPI Patient presents stating that my right foot has still been sore and even though the swelling is going down I am still having pain and maybe the second toe has moved a little bit more from previous   ROS      Objective:  Physical Exam  Neurovascular status intact negative Homans sign was noted with patient's right second digit slightly more medial than previous with inflammation still mostly second MPJ and proximal portion second digit right lateral side     Assessment:  Combination inflammatory condition with fracture of the base of second digit right lateral side     Plan:  Reviewed condition and at this point I have recommended continued immobilization and discussed surgery with possibility that we will get a need to remove this fragment and may possibly meet need to shorten the second metatarsal.  Patient will be seen back for [place] to recheck again 6 weeks or earlier and will continue with rigid bottom shoes  X-rays indicate that there is a fracture of the base the second digit right lateral side with displacement of the bone

## 2019-04-05 ENCOUNTER — Other Ambulatory Visit: Payer: Self-pay

## 2019-04-05 ENCOUNTER — Ambulatory Visit (INDEPENDENT_AMBULATORY_CARE_PROVIDER_SITE_OTHER): Payer: Medicare HMO

## 2019-04-05 ENCOUNTER — Encounter: Payer: Self-pay | Admitting: Podiatry

## 2019-04-05 ENCOUNTER — Ambulatory Visit: Payer: Medicare HMO | Admitting: Podiatry

## 2019-04-05 ENCOUNTER — Other Ambulatory Visit: Payer: Self-pay | Admitting: Podiatry

## 2019-04-05 VITALS — Temp 97.7°F

## 2019-04-05 DIAGNOSIS — M779 Enthesopathy, unspecified: Secondary | ICD-10-CM

## 2019-04-05 DIAGNOSIS — M109 Gout, unspecified: Secondary | ICD-10-CM

## 2019-04-05 DIAGNOSIS — M79672 Pain in left foot: Secondary | ICD-10-CM

## 2019-04-05 DIAGNOSIS — S92501D Displaced unspecified fracture of right lesser toe(s), subsequent encounter for fracture with routine healing: Secondary | ICD-10-CM | POA: Diagnosis not present

## 2019-04-05 DIAGNOSIS — M7752 Other enthesopathy of left foot: Secondary | ICD-10-CM | POA: Diagnosis not present

## 2019-04-05 NOTE — Progress Notes (Signed)
Subjective:   Patient ID: John Wilson, male   DOB: 73 y.o.   MRN: [id number]   HPI Patient presents stating that he has improvement of his right foot with pain still noted upon deep palpation or if he is doing a lot of activity and he developed a real flareup on his left foot around the big toe joint and on the top of the foot which for the most part is gone away but left with discoloration.  Patient states overall right foot continues to improve   ROS      Objective:  Physical Exam  Neurovascular status intact with inflammation still noted of the right second MPJ that is improved but it is present with discomfort dorsal aspect left foot localized and mild discomfort around the first MPJ left foot     Assessment:  Fracture of the right second digit which appears to be healing with mild medial rotation of the toe secondary to ligament injury with left foot showing discoloration of the top of the foot and bruising around the base of the second and third digits     Plan:  Healing fracture right second digit with left foot showing inflammatory condition with possibility for gout or other unknown inflammatory condition.  At this point with symptoms stable we will get a put him on a watch and hopefully this will be the end of his problems  X-rays indicate that there is a fracture of the second digit right on the lateral side with a small piece of bone that is free-floating but it appears to be gradually disintegrating.  Left foot was negative for signs of acute injury

## 2019-05-13 DIAGNOSIS — I77811 Abdominal aortic ectasia: Secondary | ICD-10-CM | POA: Diagnosis not present

## 2019-05-13 DIAGNOSIS — Z7984 Long term (current) use of oral hypoglycemic drugs: Secondary | ICD-10-CM | POA: Diagnosis not present

## 2019-05-13 DIAGNOSIS — I2510 Atherosclerotic heart disease of native coronary artery without angina pectoris: Secondary | ICD-10-CM | POA: Diagnosis not present

## 2019-05-13 DIAGNOSIS — E119 Type 2 diabetes mellitus without complications: Secondary | ICD-10-CM | POA: Diagnosis not present

## 2019-05-13 DIAGNOSIS — I10 Essential (primary) hypertension: Secondary | ICD-10-CM | POA: Diagnosis not present

## 2019-05-13 DIAGNOSIS — R21 Rash and other nonspecific skin eruption: Secondary | ICD-10-CM | POA: Diagnosis not present

## 2019-05-13 DIAGNOSIS — E782 Mixed hyperlipidemia: Secondary | ICD-10-CM | POA: Diagnosis not present

## 2019-05-13 DIAGNOSIS — M7989 Other specified soft tissue disorders: Secondary | ICD-10-CM | POA: Diagnosis not present

## 2019-05-16 DIAGNOSIS — M14672 Charcot's joint, left ankle and foot: Secondary | ICD-10-CM | POA: Diagnosis not present

## 2019-05-16 DIAGNOSIS — M14671 Charcot's joint, right ankle and foot: Secondary | ICD-10-CM | POA: Diagnosis not present

## 2019-09-09 ENCOUNTER — Encounter (INDEPENDENT_AMBULATORY_CARE_PROVIDER_SITE_OTHER): Payer: Self-pay

## 2019-09-16 ENCOUNTER — Other Ambulatory Visit: Payer: Self-pay

## 2019-09-16 ENCOUNTER — Encounter: Payer: Self-pay | Admitting: Cardiology

## 2019-09-16 ENCOUNTER — Encounter (INDEPENDENT_AMBULATORY_CARE_PROVIDER_SITE_OTHER): Payer: Self-pay

## 2019-09-16 ENCOUNTER — Ambulatory Visit: Payer: Medicare HMO | Admitting: Cardiology

## 2019-09-16 VITALS — BP 132/64 | HR 54 | Ht 70.0 in | Wt 250.8 lb

## 2019-09-16 DIAGNOSIS — E7800 Pure hypercholesterolemia, unspecified: Secondary | ICD-10-CM | POA: Insufficient documentation

## 2019-09-16 DIAGNOSIS — E119 Type 2 diabetes mellitus without complications: Secondary | ICD-10-CM | POA: Diagnosis not present

## 2019-09-16 DIAGNOSIS — I2510 Atherosclerotic heart disease of native coronary artery without angina pectoris: Secondary | ICD-10-CM | POA: Insufficient documentation

## 2019-09-16 DIAGNOSIS — I10 Essential (primary) hypertension: Secondary | ICD-10-CM | POA: Insufficient documentation

## 2019-09-16 MED ORDER — ATORVASTATIN CALCIUM 80 MG PO TABS: 80.0000 mg | ORAL_TABLET | Freq: Every day | ORAL | 3 refills | Status: DC
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
## 2019-09-16 NOTE — Patient Instructions (Signed)
Medication Instructions:  Please increase your Atorvastatin to 80 mg a day. Continue all other medications as listed.  *If you need a refill on your cardiac medications before your next appointment, please call your pharmacy*  Follow-Up: At [facility], you and your health needs are our priority.  As part of our continuing mission to provide you with exceptional heart care, we have created designated Provider Care Teams.  These Care Teams include your primary Cardiologist (physician) and Advanced Practice Providers (APPs -  Physician Assistants and Nurse Practitioners) who all work together to provide you with the care you need, when you need it.  Your next appointment:   24 months   The format for your next appointment:   In Person  Provider:   You may see Candee Furbish, MD or one of the following Advanced Practice Providers on your designated Care Team:    Truitt Merle, NP  Cecilie Kicks, NP  Kathyrn Drown, NP   Thank you for choosing [facility]!!

## 2019-10-30 DIAGNOSIS — E1159 Type 2 diabetes mellitus with other circulatory complications: Secondary | ICD-10-CM | POA: Diagnosis not present

## 2019-10-30 DIAGNOSIS — I2510 Atherosclerotic heart disease of native coronary artery without angina pectoris: Secondary | ICD-10-CM | POA: Diagnosis not present

## 2019-10-30 DIAGNOSIS — Z23 Encounter for immunization: Secondary | ICD-10-CM | POA: Diagnosis not present

## 2019-10-30 DIAGNOSIS — E782 Mixed hyperlipidemia: Secondary | ICD-10-CM | POA: Diagnosis not present

## 2019-10-30 DIAGNOSIS — Z0000 Encounter for general adult medical examination without abnormal findings: Secondary | ICD-10-CM | POA: Diagnosis not present

## 2019-10-30 DIAGNOSIS — I10 Essential (primary) hypertension: Secondary | ICD-10-CM | POA: Diagnosis not present

## 2019-10-30 DIAGNOSIS — Z1211 Encounter for screening for malignant neoplasm of colon: Secondary | ICD-10-CM | POA: Diagnosis not present

## 2019-10-30 DIAGNOSIS — Z125 Encounter for screening for malignant neoplasm of prostate: Secondary | ICD-10-CM | POA: Diagnosis not present

## 2019-11-13 ENCOUNTER — Telehealth: Payer: Self-pay | Admitting: Cardiology

## 2019-11-13 NOTE — Telephone Encounter (Signed)
I spoke to the patient who experienced "sharp" pain in the stomach, felt dizzy and felt as if he had to use the bathroom.  He checked his BP and it was 73/31.   The pain was radiating to his chest so he took 2 NTGs, 10 minutes apart.    The pain was immediately relieved and he feels fine now.  He will continue to monitor symptoms/ BP and is aware to go to the ED if symptoms worsen or become more frequent.  He will call to update on 12/31.

## 2019-11-13 NOTE — Telephone Encounter (Signed)
Pt c/o BP issue: STAT if pt c/o blurred vision, one-sided weakness or slurred speech  1. What are your last 5 BP readings?  68/31 77/43 108/52 93/51 2. Are you having any other symptoms (ex. Dizziness, headache, blurred vision, passed out)? Blurred vision, Dizziness, Nausea, Pain in stomach & chest   3. What is your BP issue? Patient is calling stating he got a pain that started in his stomach and went up to his lower chest. After the pain started in his chest all the other symptoms began to occur. Walking around did not help with the pain so the patient took 2 Nitroglycerin the pain then went away within 5-10 minutes.

## 2020-01-03 DIAGNOSIS — R69 Illness, unspecified: Secondary | ICD-10-CM | POA: Diagnosis not present

## 2020-01-09 DIAGNOSIS — Z1159 Encounter for screening for other viral diseases: Secondary | ICD-10-CM | POA: Diagnosis not present

## 2020-01-14 DIAGNOSIS — Z86010 Personal history of colonic polyps: Secondary | ICD-10-CM | POA: Diagnosis not present

## 2020-01-14 DIAGNOSIS — D120 Benign neoplasm of cecum: Secondary | ICD-10-CM | POA: Diagnosis not present

## 2020-01-14 DIAGNOSIS — K635 Polyp of colon: Secondary | ICD-10-CM | POA: Diagnosis not present

## 2020-01-17 DIAGNOSIS — D120 Benign neoplasm of cecum: Secondary | ICD-10-CM | POA: Diagnosis not present

## 2020-01-17 DIAGNOSIS — K635 Polyp of colon: Secondary | ICD-10-CM | POA: Diagnosis not present

## 2020-02-18 DIAGNOSIS — H2513 Age-related nuclear cataract, bilateral: Secondary | ICD-10-CM | POA: Diagnosis not present

## 2020-02-18 DIAGNOSIS — E7800 Pure hypercholesterolemia, unspecified: Secondary | ICD-10-CM | POA: Diagnosis not present

## 2020-02-18 DIAGNOSIS — H5200 Hypermetropia, unspecified eye: Secondary | ICD-10-CM | POA: Diagnosis not present

## 2020-02-18 DIAGNOSIS — H43812 Vitreous degeneration, left eye: Secondary | ICD-10-CM | POA: Diagnosis not present

## 2020-02-18 DIAGNOSIS — Z0100 Encounter for examination of eyes and vision without abnormal findings: Secondary | ICD-10-CM | POA: Diagnosis not present

## 2020-02-18 DIAGNOSIS — E119 Type 2 diabetes mellitus without complications: Secondary | ICD-10-CM | POA: Diagnosis not present

## 2020-02-18 DIAGNOSIS — H02831 Dermatochalasis of right upper eyelid: Secondary | ICD-10-CM | POA: Diagnosis not present

## 2020-02-18 DIAGNOSIS — H02834 Dermatochalasis of left upper eyelid: Secondary | ICD-10-CM | POA: Diagnosis not present

## 2020-03-13 ENCOUNTER — Encounter: Payer: Self-pay | Admitting: Cardiology

## 2020-03-13 ENCOUNTER — Ambulatory Visit: Payer: Medicare HMO | Admitting: Cardiology

## 2020-03-13 ENCOUNTER — Other Ambulatory Visit: Payer: Self-pay

## 2020-03-13 VITALS — BP 170/78 | HR 52 | Ht 70.0 in | Wt 251.0 lb

## 2020-03-13 DIAGNOSIS — E119 Type 2 diabetes mellitus without complications: Secondary | ICD-10-CM

## 2020-03-13 DIAGNOSIS — I10 Essential (primary) hypertension: Secondary | ICD-10-CM | POA: Diagnosis not present

## 2020-03-13 DIAGNOSIS — Z01812 Encounter for preprocedural laboratory examination: Secondary | ICD-10-CM

## 2020-03-13 DIAGNOSIS — R001 Bradycardia, unspecified: Secondary | ICD-10-CM

## 2020-03-13 DIAGNOSIS — I950 Idiopathic hypotension: Secondary | ICD-10-CM | POA: Diagnosis not present

## 2020-03-13 DIAGNOSIS — I2510 Atherosclerotic heart disease of native coronary artery without angina pectoris: Secondary | ICD-10-CM

## 2020-03-13 MED ORDER — ASPIRIN EC 81 MG PO TBEC
81.0000 mg | DELAYED_RELEASE_TABLET | Freq: Every day | ORAL | 3 refills | Status: DC
Start: 2020-03-13 — End: 2021-03-09

## 2020-03-13 NOTE — H&P (View-Only) (Signed)
Cardiology Office Note:    Date:  03/13/2020   ID:  John Wilson, DOB 04-28-1946, MRN [id number]  PCP:  Aurea Graff, PA-C (Inactive)  Cardiologist:  Candee Furbish, MD  Electrophysiologist:  None   Referring MD: No ref. provider found     History of Present Illness:    John Wilson is a 74 y.o. male here for follow-up of CAD hypertension.  Recently has been having some low blood pressure.  He stopped his amlodipine a few days ago.  Blood pressure currently 170/78.  He called our office previously and experienced dizziness sharp pain in the stomach felt as if he had to use the bathroom, likely vagal type.  Blood pressure was 73/31.  Took 2 nitroglycerin.  Pain was immediately relieved and felt fine. Vagal like.  Feels weak when BP low. Has BP 99991111 systolic.  I reviewed several of his Omron readings and it is not unusual for him to periodically have systolics in the [id number].  Heart rate also can dip into the low 50s upper 40s.  Bare-metal stent to RCA large dominant artery and circumflex placed in 2003 after a highly abnormal inferior wall on stress test.  This was done in [place].  Still feels like something is not right.  His prior anginal symptom was squeezing or aching in his left arm.  Past Medical History:  Diagnosis Date  . CAD (coronary artery disease)   . Diabetes ([place])   . ED (erectile dysfunction)   . Gout   . Hyperlipidemia   . Hypertension   . Obesity   . Vertigo     Past Surgical History:  Procedure Laterality Date  . CHOLECYSTECTOMY  1981  . CORONARY ANGIOPLASTY WITH STENT PLACEMENT  2001   2 separate surgeries, 1 month apart  . HERNIA REPAIR  123456   Umbilical  . PILONIDAL CYST EXCISION  1979    Current Medications: Current Meds  Medication Sig  . atorvastatin (LIPITOR) 80 MG tablet Take 1 tablet (80 mg total) by mouth daily.  Marland Kitchen doxycycline (VIBRA-TABS) 100 MG tablet   . HYDROcodone-acetaminophen (NORCO) 5-325 MG per tablet Take 1-2 tablets by  mouth every 4 (four) hours as needed for moderate pain or severe pain.  Marland Kitchen losartan-hydrochlorothiazide (HYZAAR) 100-25 MG tablet Take 1 tablet by mouth daily.  . metFORMIN (GLUCOPHAGE-XR) 500 MG 24 hr tablet Take 500 mg by mouth 2 (two) times daily.  . metoprolol succinate (TOPROL-XL) 50 MG 24 hr tablet Take 25 mg by mouth daily. Take with or immediately following a meal.   . MOBIC 15 MG tablet Take 15 mg by mouth daily.  . nitroGLYCERIN (NITROSTAT) 0.4 MG SL tablet Place 0.4 mg under the tongue every 5 (five) minutes as needed for chest pain.  . sildenafil (VIAGRA) 100 MG tablet Take 100 mg by mouth daily as needed for erectile dysfunction.  . traMADol (ULTRAM) 50 MG tablet Take 1 tablet (50 mg total) by mouth every 8 (eight) hours as needed.  . valsartan-hydrochlorothiazide (DIOVAN-HCT) 320-25 MG tablet Take 1 tablet by mouth daily.     Allergies:   Sulfa antibiotics   Social History   Socioeconomic History  . Marital status: Married    Spouse name: Not on file  . Number of children: Not on file  . Years of education: Not on file  . Highest education level: Not on file  Occupational History  . Not on file  Tobacco Use  . Smoking status: Former Smoker  Packs/day: 2.00    Years: 14.00    Pack years: 28.00    Types: Cigarettes    Quit date: 11/14/1982    Years since quitting: 37.3  . Smokeless tobacco: Never Used  . Tobacco comment: started age 21  Substance and Sexual Activity  . Alcohol use: No  . Drug use: No  . Sexual activity: Not on file  Other Topics Concern  . Not on file  Social History Narrative  . Not on file   Social Determinants of Health   Financial Resource Strain:   . Difficulty of Paying Living Expenses:   Food Insecurity:   . Worried About Charity fundraiser in the Last Year:   . Arboriculturist in the Last Year:   Transportation Needs:   . Film/video editor (Medical):   Marland Kitchen Lack of Transportation (Non-Medical):   Physical Activity:   . Days  of Exercise per Week:   . Minutes of Exercise per Session:   Stress:   . Feeling of Stress :   Social Connections:   . Frequency of Communication with Friends and Family:   . Frequency of Social Gatherings with Friends and Family:   . Attends Religious Services:   . Active Member of Clubs or Organizations:   . Attends Archivist Meetings:   Marland Kitchen Marital Status:      Family History: The patient's family history includes Sick sinus syndrome in his mother.  ROS:   Please see the history of present illness.     All other systems reviewed and are negative.  EKGs/Labs/Other Studies Reviewed:    The following studies were reviewed today: Prior office notes, EKG  EKG:  EKG is  ordered today.  The ekg ordered today demonstrates heart rate 52 bpm inferior Q waves in 3 and aVF.  Recent Labs: No results found for requested labs within last 8760 hours.  Recent Lipid Panel No results found for: CHOL, TRIG, HDL, CHOLHDL, VLDL, LDLCALC, LDLDIRECT  Physical Exam:    VS:  BP (!) 170/78   Pulse (!) 52   Ht 5\' 10"  (1.778 m)   Wt 251 lb (113.9 kg)   SpO2 92%   BMI 36.01 kg/m     Wt Readings from Last 3 Encounters:  03/13/20 251 lb (113.9 kg)  09/16/19 250 lb 12.8 oz (113.8 kg)  05/12/16 266 lb 9.6 oz (120.9 kg)     GEN:  Well nourished, well developed in no acute distress HEENT: Normal NECK: No JVD; No carotid bruits LYMPHATICS: No lymphadenopathy CARDIAC: RRR, no murmurs, rubs, gallops RESPIRATORY:  Clear to auscultation without rales, wheezing or rhonchi  ABDOMEN: Soft, non-tender, non-distended MUSCULOSKELETAL:  No edema; No deformity  SKIN: Warm and dry NEUROLOGIC:  Alert and oriented x 3 PSYCHIATRIC:  Normal affect   ASSESSMENT:    1. Pre-procedure lab exam   2. Coronary artery disease involving native coronary artery of native heart without angina pectoris   3. Idiopathic hypotension   4. Diabetes mellitus with coincident hypertension ([place])   5. Bradycardia      PLAN:    In order of problems listed above:  Coronary artery disease with abnormal hypotension concerning for ischemia in the right coronary artery distribution -Has a stent placed to his right coronary artery that is large and dominant.  I am concerned that these recent swings in blood pressure with significant hypotension and bradycardia may be associated with a right coronary artery lesion.  He is also  had some atypical chest discomfort with this.  He has peripheral neuropathy which may be masking typical anginal symptoms although previously he had a left arm aching as his typical symptom.  Because of this, I would like to proceed with left heart catheterization to ensure that he does not have any significant changes in his RCA.  Discussed procedure with him.  He is willing to proceed.  Risks of stroke heart attack death renal impairment bleeding discussed.  Peripheral neuropathy -Lower extremity Dopplers and ABIs were negative.  Diabetes with hypertension and hyperlipidemia -May be masking his typical anginal symptoms.  Significant hypotension transient -We will go ahead and continue to hold the amlodipine 5 mg that he stopped about 4 days ago.  He notes that he does not take any Viagra.  Bradycardia -Transient at times but currently 52 on ECG.  I will decrease his Toprol from 50 down to 25.   Medication Adjustments/Labs and Tests Ordered: Current medicines are reviewed at length with the patient today.  Concerns regarding medicines are outlined above.  Orders Placed This Encounter  Procedures  . CBC  . Basic metabolic panel  . EKG 12-Lead   Meds ordered this encounter  Medications  . aspirin EC 81 MG tablet    Sig: Take 1 tablet (81 mg total) by mouth daily.    Dispense:  90 tablet    Refill:  3    Patient Instructions  Medication Instructions:  Please decrease your Metoprolol to 25 mg a day. Continue to hold your Amlodipine. Start Aspirin 81 mg a day. Continue all  other medications as listed.  *If you need a refill on your cardiac medications before your next appointment, please call your pharmacy*  Lab Work: Please have blood work today (CBC, BMP) You will need to have Covid screening completed as scheduled.  If you have labs (blood work) drawn today and your tests are completely normal, you will receive your results only by: Marland Kitchen MyChart Message (if you have MyChart) OR . A paper copy in the mail If you have any lab test that is abnormal or we need to change your treatment, we will call you to review the results.   Testing/Procedures: Your physician has requested that you have a cardiac catheterization. Cardiac catheterization is used to diagnose and/or treat various heart conditions. Doctors may recommend this procedure for a number of different reasons. The most common reason is to evaluate chest pain. Chest pain can be a symptom of coronary artery disease (CAD), and cardiac catheterization can show whether plaque is narrowing or blocking your heart's arteries. This procedure is also used to evaluate the valves, as well as measure the blood flow and oxygen levels in different parts of your heart. For further information please visit HugeFiesta.[email or web address]. Please follow instruction sheet, as given.  Follow-Up: At [facility], you and your health needs are our priority.  As part of our continuing mission to provide you with exceptional heart care, we have created designated Provider Care Teams.  These Care Teams include your primary Cardiologist (physician) and Advanced Practice Providers (APPs -  Physician Assistants and Nurse Practitioners) who all work together to provide you with the care you need, when you need it.  We recommend signing up for the patient portal called "MyChart".  Sign up information is provided on this After Visit Summary.  MyChart is used to connect with patients for Virtual Visits (Telemedicine).  Patients are able to view lab/test  results, encounter notes, upcoming appointments, etc.  Non-urgent messages can be sent to your provider as well.   To learn more about what you can do with MyChart, go to [email or web address].    Your next appointment:   3 week(s)  The format for your next appointment:   In Person  Provider:   Candee Furbish, MD   Thank you for choosing [facility]!!      [place] OFFICE [place], [place] [address] Dept: [phone] Loc: [phone]  Zyheem Traywick  03/13/2020  You are scheduled for a cardiac cath on Monday Mar 16, 2020  with Dr.  Shelva Majestic .  1. Please arrive at the [facility] (Main Entrance A) at [facility]: [address] [place], [place] [address] at  8:30 am (two hours before your procedure to ensure your preparation). Free valet parking service is available.   Special note: Every effort is made to have your procedure done on time. Please understand that emergencies sometimes delay scheduled procedures.  2. Diet: Nothing after midnight.  3. Labs: Please have today (BMP,CBC)  4. Medication instructions in preparation for your procedure:  On the morning of your procedure, take your Asprin and any morning medicines NOT listed above.  You may use sips of water. Do not take your Metformin 24 hours before and 48 hours after your cardiac cath.  5. Plan for one night stay--bring personal belongings. 6. Bring a current list of your medications and current insurance cards. 7. You MUST have a responsible person to drive you home. 8. Someone MUST be with you the first 24 hours after you arrive home or your discharge will be delayed. 9. Please wear clothes that are easy to get on and off and wear slip-on shoes.  Thank you for allowing [place] to care for you!   -- [facility] Invasive Cardiovascular services     Signed, Candee Furbish, MD  03/13/2020 12:36  PM    [place]

## 2020-03-13 NOTE — Progress Notes (Signed)
Cardiology Office Note:    Date:  03/13/2020   ID:  John Wilson, DOB [birth year], MRN [id number]  PCP:  Aurea Graff, PA-C (Inactive)  Cardiologist:  Candee Furbish, MD  Electrophysiologist:  None   Referring MD: No ref. provider found     History of Present Illness:    John Wilson is a 74 y.o. male here for follow-up of CAD hypertension.  Recently has been having some low blood pressure.  He stopped his amlodipine a few days ago.  Blood pressure currently 170/78.  He called our office previously and experienced dizziness sharp pain in the stomach felt as if he had to use the bathroom, likely vagal type.  Blood pressure was 73/31.  Took 2 nitroglycerin.  Pain was immediately relieved and felt fine. Vagal like.  Feels weak when BP low. Has BP 99991111 systolic.  I reviewed several of his Omron readings and it is not unusual for him to periodically have systolics in the 123XX123.  Heart rate also can dip into the low 50s upper 40s.  Bare-metal stent to RCA large dominant artery and circumflex placed in 2003 after a highly abnormal inferior wall on stress test.  This was done in [place].  Still feels like something is not right.  His prior anginal symptom was squeezing or aching in his left arm.  Past Medical History:  Diagnosis Date  . CAD (coronary artery disease)   . Diabetes ([place])   . ED (erectile dysfunction)   . Gout   . Hyperlipidemia   . Hypertension   . Obesity   . Vertigo     Past Surgical History:  Procedure Laterality Date  . CHOLECYSTECTOMY  1981  . CORONARY ANGIOPLASTY WITH STENT PLACEMENT  2001   2 separate surgeries, 1 month apart  . HERNIA REPAIR  123456   Umbilical  . PILONIDAL CYST EXCISION  1979    Current Medications: Current Meds  Medication Sig  . atorvastatin (LIPITOR) 80 MG tablet Take 1 tablet (80 mg total) by mouth daily.  Marland Kitchen doxycycline (VIBRA-TABS) 100 MG tablet   . HYDROcodone-acetaminophen (NORCO) 5-325 MG per tablet Take 1-2 tablets by  mouth every 4 (four) hours as needed for moderate pain or severe pain.  Marland Kitchen losartan-hydrochlorothiazide (HYZAAR) 100-25 MG tablet Take 1 tablet by mouth daily.  . metFORMIN (GLUCOPHAGE-XR) 500 MG 24 hr tablet Take 500 mg by mouth 2 (two) times daily.  . metoprolol succinate (TOPROL-XL) 50 MG 24 hr tablet Take 25 mg by mouth daily. Take with or immediately following a meal.   . MOBIC 15 MG tablet Take 15 mg by mouth daily.  . nitroGLYCERIN (NITROSTAT) 0.4 MG SL tablet Place 0.4 mg under the tongue every 5 (five) minutes as needed for chest pain.  . sildenafil (VIAGRA) 100 MG tablet Take 100 mg by mouth daily as needed for erectile dysfunction.  . traMADol (ULTRAM) 50 MG tablet Take 1 tablet (50 mg total) by mouth every 8 (eight) hours as needed.  . valsartan-hydrochlorothiazide (DIOVAN-HCT) 320-25 MG tablet Take 1 tablet by mouth daily.     Allergies:   Sulfa antibiotics   Social History   Socioeconomic History  . Marital status: Married    Spouse name: Not on file  . Number of children: Not on file  . Years of education: Not on file  . Highest education level: Not on file  Occupational History  . Not on file  Tobacco Use  . Smoking status: Former Smoker  Packs/day: 2.00    Years: 14.00    Pack years: 28.00    Types: Cigarettes    Quit date: 11/14/1982    Years since quitting: 37.3  . Smokeless tobacco: Never Used  . Tobacco comment: started age 57  Substance and Sexual Activity  . Alcohol use: No  . Drug use: No  . Sexual activity: Not on file  Other Topics Concern  . Not on file  Social History Narrative  . Not on file   Social Determinants of Health   Financial Resource Strain:   . Difficulty of Paying Living Expenses:   Food Insecurity:   . Worried About Charity fundraiser in the Last Year:   . Arboriculturist in the Last Year:   Transportation Needs:   . Film/video editor (Medical):   Marland Kitchen Lack of Transportation (Non-Medical):   Physical Activity:   . Days  of Exercise per Week:   . Minutes of Exercise per Session:   Stress:   . Feeling of Stress :   Social Connections:   . Frequency of Communication with Friends and Family:   . Frequency of Social Gatherings with Friends and Family:   . Attends Religious Services:   . Active Member of Clubs or Organizations:   . Attends Archivist Meetings:   Marland Kitchen Marital Status:      Family History: The patient's family history includes Sick sinus syndrome in his mother.  ROS:   Please see the history of present illness.     All other systems reviewed and are negative.  EKGs/Labs/Other Studies Reviewed:    The following studies were reviewed today: Prior office notes, EKG  EKG:  EKG is  ordered today.  The ekg ordered today demonstrates heart rate 52 bpm inferior Q waves in 3 and aVF.  Recent Labs: No results found for requested labs within last 8760 hours.  Recent Lipid Panel No results found for: CHOL, TRIG, HDL, CHOLHDL, VLDL, LDLCALC, LDLDIRECT  Physical Exam:    VS:  BP (!) 170/78   Pulse (!) 52   Ht 5\' 10"  (1.778 m)   Wt 251 lb (113.9 kg)   SpO2 92%   BMI 36.01 kg/m     Wt Readings from Last 3 Encounters:  03/13/20 251 lb (113.9 kg)  09/16/19 250 lb 12.8 oz (113.8 kg)  05/12/16 266 lb 9.6 oz (120.9 kg)     GEN:  Well nourished, well developed in no acute distress HEENT: Normal NECK: No JVD; No carotid bruits LYMPHATICS: No lymphadenopathy CARDIAC: RRR, no murmurs, rubs, gallops RESPIRATORY:  Clear to auscultation without rales, wheezing or rhonchi  ABDOMEN: Soft, non-tender, non-distended MUSCULOSKELETAL:  No edema; No deformity  SKIN: Warm and dry NEUROLOGIC:  Alert and oriented x 3 PSYCHIATRIC:  Normal affect   ASSESSMENT:    1. Pre-procedure lab exam   2. Coronary artery disease involving native coronary artery of native heart without angina pectoris   3. Idiopathic hypotension   4. Diabetes mellitus with coincident hypertension ([place])   5. Bradycardia      PLAN:    In order of problems listed above:  Coronary artery disease with abnormal hypotension concerning for ischemia in the right coronary artery distribution -Has a stent placed to his right coronary artery that is large and dominant.  I am concerned that these recent swings in blood pressure with significant hypotension and bradycardia may be associated with a right coronary artery lesion.  He is also  had some atypical chest discomfort with this.  He has peripheral neuropathy which may be masking typical anginal symptoms although previously he had a left arm aching as his typical symptom.  Because of this, I would like to proceed with left heart catheterization to ensure that he does not have any significant changes in his RCA.  Discussed procedure with him.  He is willing to proceed.  Risks of stroke heart attack death renal impairment bleeding discussed.  Peripheral neuropathy -Lower extremity Dopplers and ABIs were negative.  Diabetes with hypertension and hyperlipidemia -May be masking his typical anginal symptoms.  Significant hypotension transient -We will go ahead and continue to hold the amlodipine 5 mg that he stopped about 4 days ago.  He notes that he does not take any Viagra.  Bradycardia -Transient at times but currently 52 on ECG.  I will decrease his Toprol from 50 down to 25.   Medication Adjustments/Labs and Tests Ordered: Current medicines are reviewed at length with the patient today.  Concerns regarding medicines are outlined above.  Orders Placed This Encounter  Procedures  . CBC  . Basic metabolic panel  . EKG 12-Lead   Meds ordered this encounter  Medications  . aspirin EC 81 MG tablet    Sig: Take 1 tablet (81 mg total) by mouth daily.    Dispense:  90 tablet    Refill:  3    Patient Instructions  Medication Instructions:  Please decrease your Metoprolol to 25 mg a day. Continue to hold your Amlodipine. Start Aspirin 81 mg a day. Continue all  other medications as listed.  *If you need a refill on your cardiac medications before your next appointment, please call your pharmacy*  Lab Work: Please have blood work today (CBC, BMP) You will need to have Covid screening completed as scheduled.  If you have labs (blood work) drawn today and your tests are completely normal, you will receive your results only by: Marland Kitchen MyChart Message (if you have MyChart) OR . A paper copy in the mail If you have any lab test that is abnormal or we need to change your treatment, we will call you to review the results.   Testing/Procedures: Your physician has requested that you have a cardiac catheterization. Cardiac catheterization is used to diagnose and/or treat various heart conditions. Doctors may recommend this procedure for a number of different reasons. The most common reason is to evaluate chest pain. Chest pain can be a symptom of coronary artery disease (CAD), and cardiac catheterization can show whether plaque is narrowing or blocking your heart's arteries. This procedure is also used to evaluate the valves, as well as measure the blood flow and oxygen levels in different parts of your heart. For further information please visit HugeFiesta.[email or web address]. Please follow instruction sheet, as given.  Follow-Up: At [facility], you and your health needs are our priority.  As part of our continuing mission to provide you with exceptional heart care, we have created designated Provider Care Teams.  These Care Teams include your primary Cardiologist (physician) and Advanced Practice Providers (APPs -  Physician Assistants and Nurse Practitioners) who all work together to provide you with the care you need, when you need it.  We recommend signing up for the patient portal called "MyChart".  Sign up information is provided on this After Visit Summary.  MyChart is used to connect with patients for Virtual Visits (Telemedicine).  Patients are able to view lab/test  results, encounter notes, upcoming appointments, etc.  Non-urgent messages can be sent to your provider as well.   To learn more about what you can do with MyChart, go to [email or web address].    Your next appointment:   3 week(s)  The format for your next appointment:   In Person  Provider:   Candee Furbish, MD   Thank you for choosing [facility]!!      [place] OFFICE [place], [place] [address] Dept: [phone] Loc: [phone]  [place]  03/13/2020  You are scheduled for a cardiac cath on Monday Mar 16, 2020  with Dr.  Shelva Majestic .  1. Please arrive at the [place] [facility] (Main Entrance A) at [facility]: [address] [place], [place] [address] at  8:30 am (two hours before your procedure to ensure your preparation). Free valet parking service is available.   Special note: Every effort is made to have your procedure done on time. Please understand that emergencies sometimes delay scheduled procedures.  2. Diet: Nothing after midnight.  3. Labs: Please have today (BMP,CBC)  4. Medication instructions in preparation for your procedure:  On the morning of your procedure, take your Asprin and any morning medicines NOT listed above.  You may use sips of water. Do not take your Metformin 24 hours before and 48 hours after your cardiac cath.  5. Plan for one night stay--bring personal belongings. 6. Bring a current list of your medications and current insurance cards. 7. You MUST have a responsible person to drive you home. 8. Someone MUST be with you the first 24 hours after you arrive home or your discharge will be delayed. 9. Please wear clothes that are easy to get on and off and wear slip-on shoes.  Thank you for allowing [place] to care for you!   -- [facility] Invasive Cardiovascular services     Signed, Candee Furbish, MD  03/13/2020 12:36  PM    [place]

## 2020-03-13 NOTE — Patient Instructions (Addendum)
Medication Instructions:  Please decrease your Metoprolol to 25 mg a day. Continue to hold your Amlodipine. Start Aspirin 81 mg a day. Continue all other medications as listed.  *If you need a refill on your cardiac medications before your next appointment, please call your pharmacy*  Lab Work: Please have blood work today (CBC, BMP) You will need to have Covid screening completed as scheduled.  If you have labs (blood work) drawn today and your tests are completely normal, you will receive your results only by: Marland Kitchen MyChart Message (if you have MyChart) OR . A paper copy in the mail If you have any lab test that is abnormal or we need to change your treatment, we will call you to review the results.   Testing/Procedures: Your physician has requested that you have a cardiac catheterization. Cardiac catheterization is used to diagnose and/or treat various heart conditions. Doctors may recommend this procedure for a number of different reasons. The most common reason is to evaluate chest pain. Chest pain can be a symptom of coronary artery disease (CAD), and cardiac catheterization can show whether plaque is narrowing or blocking your heart's arteries. This procedure is also used to evaluate the valves, as well as measure the blood flow and oxygen levels in different parts of your heart. For further information please visit [email or web address]. Please follow instruction sheet, as given.  Follow-Up: At [facility], you and your health needs are our priority.  As part of our continuing mission to provide you with exceptional heart care, we have created designated Provider Care Teams.  These Care Teams include your primary Cardiologist (physician) and Advanced Practice Providers (APPs -  Physician Assistants and Nurse Practitioners) who all work together to provide you with the care you need, when you need it.  We recommend signing up for the patient portal called "MyChart".  Sign up information  is provided on this After Visit Summary.  MyChart is used to connect with patients for Virtual Visits (Telemedicine).  Patients are able to view lab/test results, encounter notes, upcoming appointments, etc.  Non-urgent messages can be sent to your provider as well.   To learn more about what you can do with MyChart, go to [email or web address].    Your next appointment:   3 week(s)  The format for your next appointment:   In Person  Provider:   Candee Furbish, MD   Thank you for choosing [facility]!!      [facility] OFFICE [place], [place] [address] Dept: [phone] Loc: [phone]  Son [place]  03/13/2020  You are scheduled for a cardiac cath on Monday Mar 16, 2020  with Dr.  Shelva Majestic .  1. Please arrive at the [facility] (Main Entrance A) at [facility]: [address] [place], [place] [address] at  8:30 am (two hours before your procedure to ensure your preparation). Free valet parking service is available.   Special note: Every effort is made to have your procedure done on time. Please understand that emergencies sometimes delay scheduled procedures.  2. Diet: Nothing after midnight.  3. Labs: Please have today (BMP,CBC)  4. Medication instructions in preparation for your procedure:  On the morning of your procedure, take your Asprin and any morning medicines NOT listed above.  You may use sips of water. Do not take your Metformin 24 hours before and 48 hours after your cardiac cath.  5. Plan for  one night stay--bring personal belongings. 6. Bring a current list of your medications and current insurance cards. 7. You MUST have a responsible person to drive you home. 8. Someone MUST be with you the first 24 hours after you arrive home or your discharge will be delayed. 9. Please wear clothes that are easy to get on and off and wear slip-on  shoes.  Thank you for allowing [place] to care for you!   -- [facility]

## 2020-03-13 NOTE — Addendum Note (Signed)
Addended by: Candee Furbish C on: 03/13/2020 12:41 PM   Modules accepted: Orders, SmartSet

## 2020-03-14 ENCOUNTER — Other Ambulatory Visit (HOSPITAL_COMMUNITY)
Admission: RE | Admit: 2020-03-14 | Discharge: 2020-03-14 | Disposition: A | Discharge status: Home / Self Care | Payer: Medicare HMO | Source: Ambulatory Visit | Attending: Cardiovascular Disease | Admitting: Cardiovascular Disease

## 2020-03-14 DIAGNOSIS — Z01812 Encounter for preprocedural laboratory examination: Secondary | ICD-10-CM | POA: Diagnosis not present

## 2020-03-14 DIAGNOSIS — Z20822 Contact with and (suspected) exposure to covid-19: Secondary | ICD-10-CM | POA: Diagnosis not present

## 2020-03-14 LAB — CBC
Hematocrit: 44.4 % (ref 37.5–51.0)
Hemoglobin: 15.1 g/dL (ref 13.0–17.7)
MCH: 30.9 pg (ref 26.6–33.0)
MCHC: 34 g/dL (ref 31.5–35.7)
MCV: 91 fL (ref 79–97)
Platelets: 195 10*3/uL (ref 150–450)
RBC: 4.88 x10E6/uL (ref 4.14–5.80)
RDW: 13.6 % (ref 11.6–15.4)
WBC: 10.3 10*3/uL (ref 3.4–10.8)

## 2020-03-14 LAB — BASIC METABOLIC PANEL
BUN/Creatinine Ratio: 21 (ref 10–24)
BUN: 24 mg/dL (ref 8–27)
CO2: 27 mmol/L (ref 20–29)
Calcium: 9.4 mg/dL (ref 8.6–10.2)
Chloride: 99 mmol/L (ref 96–106)
Creatinine, Ser: 1.17 mg/dL (ref 0.76–1.27)
GFR calc Af Amer: 72 mL/min/{1.73_m2} (ref >59)
GFR calc non Af Amer: 62 mL/min/{1.73_m2} (ref >59)
Glucose: 95 mg/dL (ref 65–99)
Potassium: 3.4 mmol/L — ABNORMAL LOW (ref 3.5–5.2)
Sodium: 141 mmol/L (ref 134–144)

## 2020-03-14 LAB — SARS CORONAVIRUS 2 (TAT 6-24 HRS): SARS Coronavirus 2: NEGATIVE

## 2020-03-16 ENCOUNTER — Other Ambulatory Visit: Payer: Self-pay

## 2020-03-16 ENCOUNTER — Encounter (HOSPITAL_COMMUNITY)
Admission: RE | Disposition: A | Discharge status: Home / Self Care | Payer: Self-pay | Source: Home / Self Care | Attending: Cardiovascular Disease

## 2020-03-16 ENCOUNTER — Ambulatory Visit (HOSPITAL_COMMUNITY)
Admission: RE | Admit: 2020-03-16 | Discharge: 2020-03-16 | Disposition: A | Discharge status: Home / Self Care | Payer: Medicare HMO | Attending: Cardiovascular Disease | Admitting: Cardiovascular Disease

## 2020-03-16 DIAGNOSIS — I950 Idiopathic hypotension: Secondary | ICD-10-CM | POA: Diagnosis not present

## 2020-03-16 DIAGNOSIS — Z79899 Other long term (current) drug therapy: Secondary | ICD-10-CM | POA: Insufficient documentation

## 2020-03-16 DIAGNOSIS — Z791 Long term (current) use of non-steroidal anti-inflammatories (NSAID): Secondary | ICD-10-CM | POA: Insufficient documentation

## 2020-03-16 DIAGNOSIS — I25810 Atherosclerosis of coronary artery bypass graft(s) without angina pectoris: Secondary | ICD-10-CM | POA: Insufficient documentation

## 2020-03-16 DIAGNOSIS — Z882 Allergy status to sulfonamides status: Secondary | ICD-10-CM | POA: Insufficient documentation

## 2020-03-16 DIAGNOSIS — R001 Bradycardia, unspecified: Secondary | ICD-10-CM | POA: Diagnosis not present

## 2020-03-16 DIAGNOSIS — Z7984 Long term (current) use of oral hypoglycemic drugs: Secondary | ICD-10-CM | POA: Insufficient documentation

## 2020-03-16 DIAGNOSIS — Z6836 Body mass index (BMI) 36.0-36.9, adult: Secondary | ICD-10-CM | POA: Diagnosis not present

## 2020-03-16 DIAGNOSIS — Z87891 Personal history of nicotine dependence: Secondary | ICD-10-CM | POA: Insufficient documentation

## 2020-03-16 DIAGNOSIS — I2510 Atherosclerotic heart disease of native coronary artery without angina pectoris: Secondary | ICD-10-CM | POA: Diagnosis not present

## 2020-03-16 DIAGNOSIS — E785 Hyperlipidemia, unspecified: Secondary | ICD-10-CM | POA: Insufficient documentation

## 2020-03-16 DIAGNOSIS — E669 Obesity, unspecified: Secondary | ICD-10-CM | POA: Diagnosis not present

## 2020-03-16 DIAGNOSIS — E1142 Type 2 diabetes mellitus with diabetic polyneuropathy: Secondary | ICD-10-CM | POA: Insufficient documentation

## 2020-03-16 DIAGNOSIS — I10 Essential (primary) hypertension: Secondary | ICD-10-CM | POA: Insufficient documentation

## 2020-03-16 DIAGNOSIS — Z01812 Encounter for preprocedural laboratory examination: Secondary | ICD-10-CM

## 2020-03-16 HISTORY — PX: LEFT HEART CATH AND CORONARY ANGIOGRAPHY: CATH118249

## 2020-03-16 LAB — GLUCOSE, CAPILLARY: Glucose-Capillary: 123 mg/dL — ABNORMAL HIGH (ref 70–99)

## 2020-03-16 LAB — POTASSIUM: Potassium: 3.9 mmol/L (ref 3.5–5.1)

## 2020-03-16 SURGERY — LEFT HEART CATH AND CORONARY ANGIOGRAPHY
Anesthesia: LOCAL

## 2020-03-16 MED ORDER — HEPARIN (PORCINE) IN NACL 1000-0.9 UT/500ML-% IV SOLN
INTRAVENOUS | Status: DC | PRN
  Administered 2020-03-16 (×2): 500 mL

## 2020-03-16 MED ORDER — VERAPAMIL HCL 2.5 MG/ML IV SOLN
INTRAVENOUS | Status: DC | PRN
  Administered 2020-03-16: 10 mL via INTRA_ARTERIAL

## 2020-03-16 MED ORDER — IOHEXOL 350 MG/ML SOLN
INTRAVENOUS | Status: DC | PRN
  Administered 2020-03-16: 15:00:00 70 mL

## 2020-03-16 MED ORDER — HEPARIN (PORCINE) IN NACL 1000-0.9 UT/500ML-% IV SOLN
INTRAVENOUS | Status: AC
  Filled 2020-03-16: qty 1000

## 2020-03-16 MED ORDER — FENTANYL CITRATE (PF) 100 MCG/2ML IJ SOLN
INTRAMUSCULAR | Status: DC | PRN
  Administered 2020-03-16: 25 ug via INTRAVENOUS

## 2020-03-16 MED ORDER — HEPARIN SODIUM (PORCINE) 1000 UNIT/ML IJ SOLN
INTRAMUSCULAR | Status: AC
  Filled 2020-03-16: qty 1

## 2020-03-16 MED ORDER — SODIUM CHLORIDE 0.9% FLUSH: 3.0000 mL | INTRAVENOUS | Status: DC | PRN

## 2020-03-16 MED ORDER — SODIUM CHLORIDE 0.9 % WEIGHT BASED INFUSION: 1.0000 mL/kg/h | INTRAVENOUS | Status: DC

## 2020-03-16 MED ORDER — HEPARIN (PORCINE) IN NACL 1000-0.9 UT/500ML-% IV SOLN
INTRAVENOUS | Status: AC
  Filled 2020-03-16: qty 1500

## 2020-03-16 MED ORDER — HEPARIN SODIUM (PORCINE) 1000 UNIT/ML IJ SOLN
INTRAMUSCULAR | Status: DC | PRN
  Administered 2020-03-16: 6000 [IU] via INTRAVENOUS

## 2020-03-16 MED ORDER — LIDOCAINE HCL (PF) 1 % IJ SOLN
INTRAMUSCULAR | Status: DC | PRN
  Administered 2020-03-16: 2 mL

## 2020-03-16 MED ORDER — MIDAZOLAM HCL 2 MG/2ML IJ SOLN
INTRAMUSCULAR | Status: DC | PRN
  Administered 2020-03-16: 2 mg via INTRAVENOUS

## 2020-03-16 MED ORDER — LIDOCAINE HCL (PF) 1 % IJ SOLN
INTRAMUSCULAR | Status: AC
  Filled 2020-03-16: qty 30

## 2020-03-16 MED ORDER — SODIUM CHLORIDE 0.9% FLUSH: 3.0000 mL | Freq: Two times a day (BID) | INTRAVENOUS | Status: DC

## 2020-03-16 MED ORDER — ASPIRIN 81 MG PO CHEW: 81.0000 mg | CHEWABLE_TABLET | ORAL | Status: DC

## 2020-03-16 MED ORDER — SODIUM CHLORIDE 0.9 % IV SOLN: 250.0000 mL | INTRAVENOUS | Status: DC | PRN

## 2020-03-16 MED ORDER — SODIUM CHLORIDE 0.9 % WEIGHT BASED INFUSION
3.0000 mL/kg/h | INTRAVENOUS | Status: AC
  Administered 2020-03-16: 3 mL/kg/h via INTRAVENOUS

## 2020-03-16 MED ORDER — FENTANYL CITRATE (PF) 100 MCG/2ML IJ SOLN
INTRAMUSCULAR | Status: AC
  Filled 2020-03-16: qty 2

## 2020-03-16 MED ORDER — MIDAZOLAM HCL 2 MG/2ML IJ SOLN
INTRAMUSCULAR | Status: AC
  Filled 2020-03-16: qty 2

## 2020-03-16 MED ORDER — VERAPAMIL HCL 2.5 MG/ML IV SOLN
INTRAVENOUS | Status: AC
  Filled 2020-03-16: qty 2

## 2020-03-16 SURGICAL SUPPLY — 10 items
CATH INFINITI JR4 5F (CATHETERS) ×1 IMPLANT
CATH OPTITORQUE TIG 4.0 5F (CATHETERS) ×1 IMPLANT
DEVICE RAD COMP TR BAND LRG (VASCULAR PRODUCTS) ×1 IMPLANT
GLIDESHEATH SLEND SS 6F .021 (SHEATH) ×1 IMPLANT
GUIDEWIRE INQWIRE 1.5J.035X260 (WIRE) IMPLANT
INQWIRE 1.5J .035X260CM (WIRE) ×2
KIT HEART LEFT (KITS) ×2 IMPLANT
PACK CARDIAC CATHETERIZATION (CUSTOM PROCEDURE TRAY) ×2 IMPLANT
TRANSDUCER W/STOPCOCK (MISCELLANEOUS) ×2 IMPLANT
TUBING CIL FLEX 10 FLL-RA (TUBING) ×2 IMPLANT

## 2020-03-16 NOTE — Interval H&P Note (Signed)
Cath Lab Visit (complete for each Cath Lab visit)  Clinical Evaluation Leading to the Procedure:   ACS: No.  Non-ACS:    Anginal Classification: CCS III  Anti-ischemic medical therapy: Maximal Therapy (2 or more classes of medications)  Non-Invasive Test Results: No non-invasive testing performed  Prior CABG: No previous CABG      History and Physical Interval Note:  03/16/2020 1:54 PM  John Wilson  has presented today for surgery, with the diagnosis of CAD.  The various methods of treatment have been discussed with the patient and family. After consideration of risks, benefits and other options for treatment, the patient has consented to  Procedure(s): LEFT HEART CATH AND CORONARY ANGIOGRAPHY (N/A) as a surgical intervention.  The patient's history has been reviewed, patient examined, no change in status, stable for surgery.  I have reviewed the patient's chart and labs.  Questions were answered to the patient's satisfaction.     Shelva Majestic

## 2020-03-16 NOTE — Discharge Instructions (Signed)
NO METFORMIN/GLUCOPHAGE FOR 2 DAYS    Radial Site Care  This sheet gives you information about how to care for yourself after your procedure. Your health care provider may also give you more specific instructions. If you have problems or questions, contact your health care provider. What can I expect after the procedure? After the procedure, it is common to have:  Bruising and tenderness at the catheter insertion area. Follow these instructions at home: Medicines  Take over-the-counter and prescription medicines only as told by your health care provider. Insertion site care  Follow instructions from your health care provider about how to take care of your insertion site. Make sure you: ? Wash your hands with soap and water before you change your bandage (dressing). If soap and water are not available, use hand sanitizer. ? Change your dressing as told by your health care provider. ? Leave stitches (sutures), skin glue, or adhesive strips in place. These skin closures may need to stay in place for 2 weeks or longer. If adhesive strip edges start to loosen and curl up, you may trim the loose edges. Do not remove adhesive strips completely unless your health care provider tells you to do that.  Check your insertion site every day for signs of infection. Check for: ? Redness, swelling, or pain. ? Fluid or blood. ? Pus or a bad smell. ? Warmth.  Do not take baths, swim, or use a hot tub until your health care provider approves.  You may shower 24-48 hours after the procedure, or as directed by your health care provider. ? Remove the dressing and gently wash the site with plain soap and water. ? Pat the area dry with a clean towel. ? Do not rub the site. That could cause bleeding.  Do not apply powder or lotion to the site. Activity   For 24 hours after the procedure, or as directed by your health care provider: ? Do not flex or bend the affected arm. ? Do not push or pull heavy  objects with the affected arm. ? Do not drive yourself home from the hospital or clinic. You may drive 24 hours after the procedure unless your health care provider tells you not to. ? Do not operate machinery or power tools.  Do not lift anything that is heavier than 10 lb (4.5 kg), or the limit that you are told, until your health care provider says that it is safe.  Ask your health care provider when it is okay to: ? Return to work or school. ? Resume usual physical activities or sports. ? Resume sexual activity. General instructions  If the catheter site starts to bleed, raise your arm and put firm pressure on the site. If the bleeding does not stop, get help right away. This is a medical emergency.  If you went home on the same day as your procedure, a responsible adult should be with you for the first 24 hours after you arrive home.  Keep all follow-up visits as told by your health care provider. This is important. Contact a health care provider if:  You have a fever.  You have redness, swelling, or yellow drainage around your insertion site. Get help right away if:  You have unusual pain at the radial site.  The catheter insertion area swells very fast.  The insertion area is bleeding, and the bleeding does not stop when you hold steady pressure on the area.  Your arm or hand becomes pale, cool, tingly, or   numb. These symptoms may represent a serious problem that is an emergency. Do not wait to see if the symptoms will go away. Get medical help right away. Call your local emergency services (911 in the [place] Do not drive yourself to the hospital. Summary  After the procedure, it is common to have bruising and tenderness at the site.  Follow instructions from your health care provider about how to take care of your radial site wound. Check the wound every day for signs of infection.  Do not lift anything that is heavier than 10 lb (4.5 kg), or the limit that you are told,  until your health care provider says that it is safe. This information is not intended to replace advice given to you by your health care provider. Make sure you discuss any questions you have with your health care provider. Document Revised: 12/06/2017 Document Reviewed: 12/06/2017 Elsevier Patient Education  2020 Elsevier Inc.  

## 2020-03-18 ENCOUNTER — Telehealth: Payer: Self-pay | Admitting: Cardiology

## 2020-03-18 MED ORDER — ISOSORBIDE MONONITRATE ER 30 MG PO TB24
30.0000 mg | ORAL_TABLET | Freq: Every day | ORAL | 3 refills | Status: DC
Start: 2020-03-18 — End: 2022-01-25

## 2020-03-18 NOTE — Telephone Encounter (Signed)
Start Imdur 30mg  PO QD Thanks Candee Furbish, MD

## 2020-03-18 NOTE — Telephone Encounter (Signed)
Called patient back about his message. Patient stated he was told to resume amlodipine 5 mg at bedtime. Patient stated Dr. Claiborne Billings was also talking about starting him on something else but was going to discuss with Dr. Marlou Porch. Patient might be referring to anti-ischemic medicines. Will forward to Dr. Marlou Porch for advisement. Patient is following up with Dr. Marlou Porch on 04/07/20, but he would like to start on this medications before he sees him for follow up.  Per Cath note " Increase medical therapy trial.  Recommend resumption of amlodipine 5 mg at bedtime; the patient will continue with metoprolol in a.m. as prescribed by Dr. Marlou Porch.  Consider additional anti-ischemic medicines if necessary.  Due to the RCA vessel tortuosity, PCI would be difficult and with his asymptomatic status with reference to chest pain and normal wall motion initial increase medical therapy was recommended.  Will need aggressive lipid management with target LDL less than 70 and preferably 50 or below in attempt to induce plaque regression.  Patient will follow up with Dr. Marlou Porch.Marland Kitchen"

## 2020-03-18 NOTE — Telephone Encounter (Signed)
Called patient back with Dr. Marlou Porch' recommendation. Informed patient that he could not take viagra when he is on Imdur. Patient stated he does not take that medication anymore. Will take Viagra off patient's list. Sent Imdur 30 mg to patient's pharmacy of choice. Patient verbalized understanding.

## 2020-03-18 NOTE — Telephone Encounter (Signed)
   Pt said while he was in the hospital a doctor said they found 70% blockage in his heart and was told he will have new prescription, he wasn't sure what prescription it is but he wanted to know if he needs to start taking that before seeing Dr. Marlou Porch on 04/07/20  Please advise

## 2020-04-07 ENCOUNTER — Encounter: Payer: Self-pay | Admitting: Cardiology

## 2020-04-07 ENCOUNTER — Other Ambulatory Visit: Payer: Self-pay

## 2020-04-07 ENCOUNTER — Ambulatory Visit: Payer: Medicare HMO | Admitting: Cardiology

## 2020-04-07 VITALS — BP 126/62 | HR 64 | Ht 70.0 in | Wt 251.8 lb

## 2020-04-07 DIAGNOSIS — I2510 Atherosclerotic heart disease of native coronary artery without angina pectoris: Secondary | ICD-10-CM

## 2020-04-07 DIAGNOSIS — I950 Idiopathic hypotension: Secondary | ICD-10-CM

## 2020-04-07 NOTE — Patient Instructions (Addendum)
Medication Instructions:  Please discontinue your Amlodipine.  Continue all other medications as listed.  *If you need a refill on your cardiac medications before your next appointment, please call your pharmacy*  Follow-Up: At [facility], you and your health needs are our priority.  As part of our continuing mission to provide you with exceptional heart care, we have created designated Provider Care Teams.  These Care Teams include your primary Cardiologist (physician) and Advanced Practice Providers (APPs -  Physician Assistants and Nurse Practitioners) who all work together to provide you with the care you need, when you need it.  We recommend signing up for the patient portal called "MyChart".  Sign up information is provided on this After Visit Summary.  MyChart is used to connect with patients for Virtual Visits (Telemedicine).  Patients are able to view lab/test results, encounter notes, upcoming appointments, etc.  Non-urgent messages can be sent to your provider as well.   To learn more about what you can do with MyChart, go to [email or web address].    Your next appointment:   2-3 week(s)  The format for your next appointment:   In Person  Provider:   Candee Furbish, MD   Thank you for choosing [facility]!!

## 2020-04-07 NOTE — Progress Notes (Signed)
Cardiology Office Note:    Date:  04/07/2020   ID:  John Wilson, DOB [birth year], MRN [id number]  PCP:  Aurea Graff, PA-C (Inactive)  Cardiologist:  Candee Furbish, MD  Electrophysiologist:  None   Referring MD: No ref. provider found     History of Present Illness:    John Wilson is a 74 y.o. male here for follow-up bare-metal stent RCA placed in 2003 after a highly abnormal inferior wall stress test with cardiac catheterization.   Dist RCA-1 lesion is 70% stenosed.  Previously placed Dist RCA-2 stent (unknown type) is widely patent.  Previously placed 1st Mrg stent (unknown type) is widely patent.  The left ventricular ejection fraction is 55-65% by visual estimate.  The left ventricular systolic function is normal.   Single-vessel coronary artery disease in this patient with a normal left main which trifurcates into an LAD, ramus intermediate, and left circumflex vessel.  The LAD does not reach the apex and is mild luminal irregularity in the mid segment without significant stenoses.  The ramus immediate vessel appears to have a widely patent proximal stent prior to its bifurcation.  The left circumflex vessels angiographically normal and gives rise to 2 marginal vessels.  Very large tortuous dominant RCA which has several sharp angles and beyond the sharp angled segment after the acute margin and before the takeoff of the PDA and posterior lateral vessel there appears to be a 70% stenosis.  There is a patent stent which is hard to discern prior to the takeoff of the PDA.  The PDA and PLA vessels are extremely large and extend all the way to the LV apex.  Normal LV function with EF estimate at least 60% without segmental wall motion abnormalities.  LVEDP 15 mmHg.  RECOMMENDATION: Increase medical therapy trial.  Recommend resumption of amlodipine 5 mg at bedtime; the patient will continue with metoprolol in a.m. as prescribed by Dr. Marlou Porch.  Consider additional  anti-ischemic medicines if necessary.  Due to the RCA vessel tortuosity, PCI would be difficult and with his asymptomatic status with reference to chest pain and normal wall motion initial increase medical therapy was recommended.  Will need aggressive lipid management with target LDL less than 70 and preferably 50 or below in attempt to induce plaque regression.  Patient will follow up with Dr. Marlou Porch..  Taking metoprolol25 a tablet and isosorbide 30 mg a day.  Biggest problem is BP. Feels low BP and weak. 98/59. Sometimes at night. Feel all energy leaves. Takes BP meds in morning. Wakes up 5am every day. 6am BP check too low.    Past Medical History:  Diagnosis Date  . CAD (coronary artery disease)   . Diabetes ([place])   . ED (erectile dysfunction)   . Gout   . Hyperlipidemia   . Hypertension   . Obesity   . Vertigo     Past Surgical History:  Procedure Laterality Date  . CHOLECYSTECTOMY  1981  . CORONARY ANGIOPLASTY WITH STENT PLACEMENT  2001   2 separate surgeries, 1 month apart  . HERNIA REPAIR  123456   Umbilical  . LEFT HEART CATH AND CORONARY ANGIOGRAPHY N/A 03/16/2020   Procedure: LEFT HEART CATH AND CORONARY ANGIOGRAPHY;  Surgeon: Troy Sine, MD;  Location: [facility];  Service: Cardiovascular;  Laterality: N/A;  . PILONIDAL CYST EXCISION  1979    Current Medications: Current Meds  Medication Sig  . aspirin EC 81 MG tablet Take 1 tablet (81 mg total) by  mouth daily.  Marland Kitchen atorvastatin (LIPITOR) 80 MG tablet Take 1 tablet (80 mg total) by mouth daily.  . isosorbide mononitrate (IMDUR) 30 MG 24 hr tablet Take 1 tablet (30 mg total) by mouth daily.  Marland Kitchen losartan-hydrochlorothiazide (HYZAAR) 100-25 MG tablet Take 1 tablet by mouth daily.  . metFORMIN (GLUCOPHAGE-XR) 500 MG 24 hr tablet Take 500 mg by mouth daily with breakfast.   . metoprolol succinate (TOPROL-XL) 50 MG 24 hr tablet Take 25 mg by mouth daily. Take with or immediately following a meal.   . MOBIC 15 MG  tablet Take 15 mg by mouth daily as needed for pain.   . nitroGLYCERIN (NITROSTAT) 0.4 MG SL tablet Place 0.4 mg under the tongue every 5 (five) minutes as needed for chest pain.  . [DISCONTINUED] amLODipine (NORVASC) 5 MG tablet Take 5 mg by mouth daily.     Allergies:   Sulfa antibiotics   Social History   Socioeconomic History  . Marital status: Married    Spouse name: Not on file  . Number of children: Not on file  . Years of education: Not on file  . Highest education level: Not on file  Occupational History  . Not on file  Tobacco Use  . Smoking status: Former Smoker    Packs/day: 2.00    Years: 14.00    Pack years: 28.00    Types: Cigarettes    Quit date: 11/14/1982    Years since quitting: 37.4  . Smokeless tobacco: Never Used  . Tobacco comment: started age 15  Substance and Sexual Activity  . Alcohol use: No  . Drug use: No  . Sexual activity: Not on file  Other Topics Concern  . Not on file  Social History Narrative  . Not on file   Social Determinants of Health   Financial Resource Strain:   . Difficulty of Paying Living Expenses:   Food Insecurity:   . Worried About Charity fundraiser in the Last Year:   . Arboriculturist in the Last Year:   Transportation Needs:   . Film/video editor (Medical):   Marland Kitchen Lack of Transportation (Non-Medical):   Physical Activity:   . Days of Exercise per Week:   . Minutes of Exercise per Session:   Stress:   . Feeling of Stress :   Social Connections:   . Frequency of Communication with Friends and Family:   . Frequency of Social Gatherings with Friends and Family:   . Attends Religious Services:   . Active Member of Clubs or Organizations:   . Attends Archivist Meetings:   Marland Kitchen Marital Status:      Family History: The patient's family history includes Sick sinus syndrome in his mother.  ROS:   Please see the history of present illness.     All other systems reviewed and are negative.   EKGs/Labs/Other Studies Reviewed:    The following studies were reviewed today: cath  EKG:  EKG is not ordered today.    Recent Labs: 03/13/2020: BUN 24; Creatinine, Ser 1.17; Hemoglobin 15.1; Platelets 195; Sodium 141 03/16/2020: Potassium 3.9  Recent Lipid Panel No results found for: CHOL, TRIG, HDL, CHOLHDL, VLDL, LDLCALC, LDLDIRECT  Physical Exam:    VS:  BP 126/62   Pulse 64   Ht 5\' 10"  (1.778 m)   Wt 251 lb 12.8 oz (114.2 kg)   SpO2 96%   BMI 36.13 kg/m     Wt Readings from Last 3 Encounters:  04/07/20 251 lb 12.8 oz (114.2 kg)  03/16/20 251 lb (113.9 kg)  03/13/20 251 lb (113.9 kg)     GEN:  Well nourished, well developed in no acute distress HEENT: Normal NECK: No JVD; No carotid bruits LYMPHATICS: No lymphadenopathy CARDIAC: RRR, no murmurs, rubs, gallops RESPIRATORY:  Clear to auscultation without rales, wheezing or rhonchi  ABDOMEN: Soft, non-tender, non-distended MUSCULOSKELETAL:  No edema; No deformity  SKIN: Warm and dry NEUROLOGIC:  Alert and oriented x 3 PSYCHIATRIC:  Normal affect   ASSESSMENT:    1. Coronary artery disease involving native coronary artery of native heart without angina pectoris   2. Idiopathic hypotension    PLAN:    In order of problems listed above:  Hypotension -Likely iatrogenic.  We will stop amlodipine 5 mg.  Feeling weak at times.  Could also be a vagal component as sometimes these are happening in the middle night when he does go to the bathroom.  Are for steps will be to one by one pull back on the medications. -Next step may be to cut the angiotensin receptor blocker. -He does asked that he keeps the diuretic. -Isosorbide also taken in the evening may be too much for him.  CAD -Distal RCA 70%.  Continue with medical management.  See heart catheterization above.  Appreciate Dr. Evette Georges assistance.  Hyperlipidemia -Continue with atorvastatin.   Medication Adjustments/Labs and Tests Ordered: Current medicines are  reviewed at length with the patient today.  Concerns regarding medicines are outlined above.  No orders of the defined types were placed in this encounter.  No orders of the defined types were placed in this encounter.   Patient Instructions  Medication Instructions:  Please discontinue your Amlodipine.  Continue all other medications as listed.  *If you need a refill on your cardiac medications before your next appointment, please call your pharmacy*  Follow-Up: At [facility], you and your health needs are our priority.  As part of our continuing mission to provide you with exceptional heart care, we have created designated Provider Care Teams.  These Care Teams include your primary Cardiologist (physician) and Advanced Practice Providers (APPs -  Physician Assistants and Nurse Practitioners) who all work together to provide you with the care you need, when you need it.  We recommend signing up for the patient portal called "MyChart".  Sign up information is provided on this After Visit Summary.  MyChart is used to connect with patients for Virtual Visits (Telemedicine).  Patients are able to view lab/test results, encounter notes, upcoming appointments, etc.  Non-urgent messages can be sent to your provider as well.   To learn more about what you can do with MyChart, go to [email or web address].    Your next appointment:   2-3 week(s)  The format for your next appointment:   In Person  Provider:   Candee Furbish, MD   Thank you for choosing [facility]!!         Signed, Candee Furbish, MD  04/07/2020 2:20 PM    [place]

## 2020-04-14 DIAGNOSIS — Z23 Encounter for immunization: Secondary | ICD-10-CM | POA: Diagnosis not present

## 2020-04-14 DIAGNOSIS — I10 Essential (primary) hypertension: Secondary | ICD-10-CM | POA: Diagnosis not present

## 2020-04-14 DIAGNOSIS — E782 Mixed hyperlipidemia: Secondary | ICD-10-CM | POA: Diagnosis not present

## 2020-04-14 DIAGNOSIS — E119 Type 2 diabetes mellitus without complications: Secondary | ICD-10-CM | POA: Diagnosis not present

## 2020-04-14 DIAGNOSIS — I2510 Atherosclerotic heart disease of native coronary artery without angina pectoris: Secondary | ICD-10-CM | POA: Diagnosis not present

## 2020-04-23 ENCOUNTER — Other Ambulatory Visit: Payer: Self-pay

## 2020-04-23 ENCOUNTER — Ambulatory Visit: Payer: Medicare HMO | Admitting: Cardiology

## 2020-04-23 ENCOUNTER — Encounter: Payer: Self-pay | Admitting: Cardiology

## 2020-04-23 VITALS — BP 148/60 | HR 55 | Ht 70.0 in | Wt 255.0 lb

## 2020-04-23 DIAGNOSIS — I2510 Atherosclerotic heart disease of native coronary artery without angina pectoris: Secondary | ICD-10-CM

## 2020-04-23 DIAGNOSIS — I950 Idiopathic hypotension: Secondary | ICD-10-CM

## 2020-04-23 NOTE — Patient Instructions (Signed)

## 2020-04-23 NOTE — Progress Notes (Signed)
Cardiology Office Note:    Date:  04/23/2020   ID:  John Wilson, DOB 03/24/1946, MRN [id number]  PCP:  Aurea Graff, PA-C (Inactive)  [facility] Cardiologist:  Candee Furbish, MD  [facility] Electrophysiologist:  None   Referring MD: No ref. provider found     History of Present Illness:    John Wilson is a 74 y.o. male here for follow-up of coronary artery disease.  RCA stent was placed in 2003.  Inferior wall abnormality noted on stress.  Most recent catheterization took place in 03/2020-medical management.  Blood pressure at last visit was 98/59 at times.  Feels at night.  Low energy levels.  When he wakes up in the morning, blood pressure very low.  At that time I stopped his amlodipine 5 mg.  I was thinking it could also be a neurocardiogenic component or vagal component since when he gets up to go to the bathroom at night his blood pressures are quite low.  He did asked that we keep the diuretic in place. We may need to cut the angiotensin receptor blocker.  Isosorbide in the evening may be too much.  Overall, I am quite pleased with how he is doing.  He showed me his blood pressure log on his blood pressure machine.  Past Medical History:  Diagnosis Date   CAD (coronary artery disease)    Diabetes ([place])    ED (erectile dysfunction)    Gout    Hyperlipidemia    Hypertension    Obesity    Vertigo     Past Surgical History:  Procedure Laterality Date   CHOLECYSTECTOMY  1981   CORONARY ANGIOPLASTY WITH STENT PLACEMENT  2001   2 separate surgeries, 1 month apart   HERNIA REPAIR  9924   Umbilical   LEFT HEART CATH AND CORONARY ANGIOGRAPHY N/A 03/16/2020   Procedure: LEFT HEART CATH AND CORONARY ANGIOGRAPHY;  Surgeon: Troy Sine, MD;  Location: [facility];  Service: Cardiovascular;  Laterality: N/A;   PILONIDAL CYST EXCISION  1979    Current Medications: Current Meds  Medication Sig   aspirin EC 81 MG tablet Take 1 tablet  (81 mg total) by mouth daily.   atorvastatin (LIPITOR) 80 MG tablet Take 1 tablet (80 mg total) by mouth daily.   isosorbide mononitrate (IMDUR) 30 MG 24 hr tablet Take 1 tablet (30 mg total) by mouth daily.   losartan-hydrochlorothiazide (HYZAAR) 100-25 MG tablet Take 1 tablet by mouth daily.   metFORMIN (GLUCOPHAGE-XR) 500 MG 24 hr tablet Take 500 mg by mouth daily with breakfast.    metoprolol succinate (TOPROL-XL) 50 MG 24 hr tablet Take 25 mg by mouth daily. Take with or immediately following a meal.    MOBIC 15 MG tablet Take 15 mg by mouth daily as needed for pain.    nitroGLYCERIN (NITROSTAT) 0.4 MG SL tablet Place 0.4 mg under the tongue every 5 (five) minutes as needed for chest pain.     Allergies:   Sulfa antibiotics   Social History   Socioeconomic History   Marital status: Married    Spouse name: Not on file   Number of children: Not on file   Years of education: Not on file   Highest education level: Not on file  Occupational History   Not on file  Tobacco Use   Smoking status: Former Smoker    Packs/day: 2.00    Years: 14.00    Pack years: 28.00    Types:  Cigarettes    Quit date: 11/14/1982    Years since quitting: 37.4   Smokeless tobacco: Never Used   Tobacco comment: started age 35  Vaping Use   Vaping Use: Never used  Substance and Sexual Activity   Alcohol use: No   Drug use: No   Sexual activity: Not on file  Other Topics Concern   Not on file  Social History Narrative   Not on file   Social Determinants of Health   Financial Resource Strain:    Difficulty of Paying Living Expenses:   Food Insecurity:    Worried About Charity fundraiser in the Last Year:    Arboriculturist in the Last Year:   Transportation Needs:    Film/video editor (Medical):    Lack of Transportation (Non-Medical):   Physical Activity:    Days of Exercise per Week:    Minutes of Exercise per Session:   Stress:    Feeling of Stress :     Social Connections:    Frequency of Communication with Friends and Family:    Frequency of Social Gatherings with Friends and Family:    Attends Religious Services:    Active Member of Clubs or Organizations:    Attends Archivist Meetings:    Marital Status:      Family History: The patient's family history includes Sick sinus syndrome in his mother.  ROS:   Please see the history of present illness.     All other systems reviewed and are negative.  EKGs/Labs/Other Studies Reviewed:    The following studies were reviewed today:  Cardiac catheterization 03/19/2020:   Dist RCA-1 lesion is 70% stenosed.  Previously placed Dist RCA-2 stent (unknown type) is widely patent.  Previously placed 1st Mrg stent (unknown type) is widely patent.  The left ventricular ejection fraction is 55-65% by visual estimate.  The left ventricular systolic function is normal.  Single-vessel coronary artery disease in this patient with a normal left main which trifurcates into an LAD, ramus intermediate, and left circumflex vessel.  The LAD does not reach the apex and is mild luminal irregularity in the mid segment without significant stenoses.  The ramus immediate vessel appears to have a widely patent proximal stent prior to its bifurcation.  The left circumflex vessels angiographically normal and gives rise to 2 marginal vessels.  Very large tortuous dominant RCA which has several sharp angles and beyond the sharp angled segment after the acute margin and before the takeoff of the PDA and posterior lateral vessel there appears to be a 70% stenosis. There is a patent stent which is hard to discern prior to the takeoff of the PDA. The PDA and PLA vessels are extremely large and extend all the way to the LV apex.  Normal LV function with EF estimate at least 60% without segmental wall motion abnormalities. LVEDP 15 mmHg.  RECOMMENDATION: Increase medical therapy trial.  Recommend resumption of amlodipine 5 mg at bedtime; the patient will continue with metoprolol in a.m. as prescribed by Dr. Marlou Porch. Consider additional anti-ischemic medicines if necessary. Due to the RCA vessel tortuosity, PCI would be difficult and with his asymptomatic status with reference to chest pain and normal wall motion initial increase medical therapy was recommended. Will need aggressive lipid management with target LDL less than 70 and preferably 50 or below in attempt to induce plaque regression. Patient will follow up with Dr. Marlou Porch..  Diagnostic Dominance: Right     Recent  Labs: 03/13/2020: BUN 24; Creatinine, Ser 1.17; Hemoglobin 15.1; Platelets 195; Sodium 141 03/16/2020: Potassium 3.9  Recent Lipid Panel No results found for: CHOL, TRIG, HDL, CHOLHDL, VLDL, LDLCALC, LDLDIRECT  Physical Exam:    VS:  BP (!) 148/60    Pulse (!) 55    Ht 5\' 10"  (1.778 m)    Wt 255 lb (115.7 kg)    SpO2 96%    BMI 36.59 kg/m     Wt Readings from Last 3 Encounters:  04/23/20 255 lb (115.7 kg)  04/07/20 251 lb 12.8 oz (114.2 kg)  03/16/20 251 lb (113.9 kg)     GEN:  Well nourished, well developed in no acute distress HEENT: Normal NECK: No JVD; No carotid bruits LYMPHATICS: No lymphadenopathy CARDIAC: RRR, no murmurs, rubs, gallops RESPIRATORY:  Clear to auscultation without rales, wheezing or rhonchi  ABDOMEN: Soft, non-tender, non-distended MUSCULOSKELETAL:  No edema; No deformity  SKIN: Warm and dry NEUROLOGIC:  Alert and oriented x 3 PSYCHIATRIC:  Normal affect   ASSESSMENT:    1. Coronary artery disease involving native coronary artery of native heart without angina pectoris   2. Idiopathic hypotension    PLAN:    In order of problems listed above:  Coronary artery disease -Distal RCA 70%-medical management.  Secondary prevention.  No anginal symptoms.  We discussed cath results once again.    Hypotension -At last visit we stopped the amlodipine 5 mg.  His blood  pressures at home have been excellent.  He brought in his machine today for me to scroll through.  110 over 60s 120 over 53s.  No other changes in medications  Hyperlipidemia -Continue with atorvastatin with goal LDL less than 70.  High intensity   Medication Adjustments/Labs and Tests Ordered: Current medicines are reviewed at length with the patient today.  Concerns regarding medicines are outlined above.  No orders of the defined types were placed in this encounter.  No orders of the defined types were placed in this encounter.   Patient Instructions  Medication Instructions:  The current medical regimen is effective;  continue present plan and medications.  *If you need a refill on your cardiac medications before your next appointment, please call your pharmacy*  Follow-Up: At [facility], you and your health needs are our priority.  As part of our continuing mission to provide you with exceptional heart care, we have created designated Provider Care Teams.  These Care Teams include your primary Cardiologist (physician) and Advanced Practice Providers (APPs -  Physician Assistants and Nurse Practitioners) who all work together to provide you with the care you need, when you need it.  We recommend signing up for the patient portal called "MyChart".  Sign up information is provided on this After Visit Summary.  MyChart is used to connect with patients for Virtual Visits (Telemedicine).  Patients are able to view lab/test results, encounter notes, upcoming appointments, etc.  Non-urgent messages can be sent to your provider as well.   To learn more about what you can do with MyChart, go to [email or web address].    Your next appointment:   6 month(s)  The format for your next appointment:   In Person  Provider:   Candee Furbish, MD   Thank you for choosing [facility]!!        Signed, Candee Furbish, MD  04/23/2020 3:30 PM    [place]

## 2020-07-06 DIAGNOSIS — M545 Low back pain: Secondary | ICD-10-CM | POA: Diagnosis not present

## 2020-07-10 DIAGNOSIS — R69 Illness, unspecified: Secondary | ICD-10-CM | POA: Diagnosis not present

## 2020-07-14 ENCOUNTER — Other Ambulatory Visit: Payer: Self-pay

## 2020-07-14 ENCOUNTER — Ambulatory Visit: Payer: Medicare HMO | Attending: Orthopedic Surgery | Admitting: Physical Therapy

## 2020-07-14 ENCOUNTER — Encounter: Payer: Self-pay | Admitting: Physical Therapy

## 2020-07-14 DIAGNOSIS — M545 Low back pain: Secondary | ICD-10-CM | POA: Diagnosis not present

## 2020-07-14 DIAGNOSIS — R262 Difficulty in walking, not elsewhere classified: Secondary | ICD-10-CM | POA: Diagnosis not present

## 2020-07-14 DIAGNOSIS — M6281 Muscle weakness (generalized): Secondary | ICD-10-CM

## 2020-07-14 DIAGNOSIS — G8929 Other chronic pain: Secondary | ICD-10-CM

## 2020-07-14 NOTE — Patient Instructions (Signed)
Access Code: [email or web address] URL: [email or web address] Date: 07/14/2020 Prepared by: Kearney Hard  Exercises Supine March - 2 x daily - 7 x weekly - 3 sets - 10 reps Hooklying Hamstring Stretch with Strap - 2 x daily - 7 x weekly - 3-5 reps - 30 seconds hold Supine Lower Trunk Rotation - 2 x daily - 7 x weekly - 5 reps - 20-30 seconds hold Seated Transversus Abdominis Bracing - 2 x daily - 7 x weekly - 2 sets - 10 reps - 5 seconds hold

## 2020-07-14 NOTE — Therapy (Signed)
[facility] [place], [place], [address] Phone: [phone]   Fax:  [phone]  Physical Therapy Evaluation  Patient Details  Name: John Wilson MRN: [id number] Date of Birth: 02/11/1946 Referring Provider (PT): Cleta Alberts, [place]   Encounter Date: 07/14/2020   PT End of Session - 07/14/20 4268    Visit Number 1    Number of Visits 13    Date for PT Re-Evaluation 08/28/20    Authorization Type KX modifier at visit 15, PN every 10th visit    Progress Note Due on Visit 10    PT Start Time 0815    PT Stop Time 0900    PT Time Calculation (min) 45 min    Activity Tolerance Patient tolerated treatment well    Behavior During Therapy [facility] for tasks assessed/performed           Past Medical History:  Diagnosis Date  . CAD (coronary artery disease)   . Diabetes ([place])   . ED (erectile dysfunction)   . Gout   . Hyperlipidemia   . Hypertension   . Obesity   . Vertigo     Past Surgical History:  Procedure Laterality Date  . CHOLECYSTECTOMY  1981  . CORONARY ANGIOPLASTY WITH STENT PLACEMENT  2001   2 separate surgeries, 1 month apart  . HERNIA REPAIR  3419   Umbilical  . LEFT HEART CATH AND CORONARY ANGIOGRAPHY N/A 03/16/2020   Procedure: LEFT HEART CATH AND CORONARY ANGIOGRAPHY;  Surgeon: Troy Sine, MD;  Location: [place] [facility];  Service: Cardiovascular;  Laterality: N/A;  . PILONIDAL CYST EXCISION  1979    There were no vitals filed for this visit.    Subjective Assessment - 07/14/20 0818    Subjective Pt arriving to therapy reporting low back pain with no sciatica. Pt reporting his pain was so bad about 3 months ago he couldn't walk. Pt reporting moving in bed and hearing a pop and his pain was better. Pt reporting long standing history of arthritis. Pt reporting bending is worse and he is unable to garden.    Pertinent History CAD, DM2, gout, HTN, vertigo, gout, hyperlipidemia, HTN, cyst removed from  spine, naval hernia, 2 cardiac stents, gall bladder removal.    Limitations House hold activities;Other (comment);Standing    How long can you sit comfortably? unlimited    Diagnostic tests X-ray    Patient Stated Goals Stop hurting, being able to bend over without pain, be able to sleep comfortable.    Currently in Pain? Yes    Pain Score 3     Pain Location Back    Pain Orientation Lower;Right    Pain Descriptors / Indicators Aching;Tightness    Pain Type Chronic pain    Pain Onset More than a month ago    Pain Frequency Intermittent    Aggravating Factors  bending, lying on R side    Pain Relieving Factors sitting, changing positions    Effect of Pain on Daily Activities unable to garden without pain              [facility] PT Assessment - 07/14/20 0001      Assessment   Medical Diagnosis Low back pain R>L M54.5    Referring Provider (PT) Estill Bamberg Ward, PA    Onset Date/Surgical Date --   > 3 months   Hand Dominance Right    Prior Therapy yes, over 1 year ago for sciatica      Precautions  Precautions None      Restrictions   Weight Bearing Restrictions No      Balance Screen   Has the patient fallen in the past 6 months No    Is the patient reluctant to leave their home because of a fear of falling?  No      Prior Function   Level of Independence Independent    Vocation Retired    Leisure fish, hunting, riding ATV      Cognition   Overall Cognitive Status Within Functional Limits for tasks assessed      Observation/Other Assessments   Focus on Therapeutic Outcomes (FOTO)  deferred due to pt being treated for LBP with sciatica before and long standing history of chronic back pain      Posture/Postural Control   Posture/Postural Control Postural limitations    Postural Limitations Rounded Shoulders;Forward head;Increased thoracic kyphosis    Posture Comments abdominal diastasis recti noted      ROM / Strength   AROM / PROM / Strength AROM;Strength      AROM    AROM Assessment Site Lumbar    Lumbar Flexion 10    Lumbar Extension 70   increased pain noted more on the left side   Lumbar - Right Side Bend 26    Lumbar - Left Side Bend 20    Lumbar - Right Rotation limitation 25%    Lumbar - Left Rotation limitation 25%       Strength   Overall Strength Deficits    Strength Assessment Site Hip    Right/Left Hip Right;Left    Right Hip Flexion 5/5    Right Hip ABduction 4/5    Right Hip ADduction 4/5    Left Hip Flexion 5/5    Left Hip ABduction 4/5    Left Hip ADduction 4/5      Palpation   Palpation comment TTP: lumbar paraspinal tightness, active trigger points noted,       Special Tests    Special Tests Lumbar    Lumbar Tests Slump Test      Slump test   Findings Positive    Side Left    Comment negative on right      Ambulation/Gait   Gait Comments amb with wide base of supprt, mild valgus noted bilateral knees                      Objective measurements completed on examination: See above findings.               PT Education - 07/14/20 0903    Education Details PT POC, HEP, discussed possibly DN at future visit    Person(s) Educated Patient    Methods Explanation;Demonstration;Handout    Comprehension Returned demonstration;Verbalized understanding               PT Long Term Goals - 07/14/20 0814      PT LONG TERM GOAL #1   Title Pt will be able to demonstrate proper body mechanics for posture correction and lifting techniques.    Time 6    Period Weeks    Status New    Target Date 08/28/20      PT LONG TERM GOAL #2   Title Independent with advanced HEP    Time 6    Period Weeks    Status New    Target Date 08/28/20      PT LONG TERM GOAL #3   Title Perform ADLs  with pain </= 3/10.    Time 6    Period Weeks    Status New    Target Date 08/28/20      PT LONG TERM GOAL #4   Title Pt will be able to amb 15 minutes with pain </= 3/10.    Time 6    Period Weeks    Status New      Target Date 08/28/20      PT LONG TERM GOAL #5   Title -                  Plan - 07/14/20 1247    Clinical Impression Statement Pt arriving to therpay for evaluaiton of LBP which is worse with bending. Pt  reporting history of low back pain with sciatica from almost 1 year ago. Pt with mild weakness noted in bilateral hip abd/adduction. Pt with noted diastasis recti and weakness in core. Pt with positive slump test on the left. Pt with mild forward flexed posture with walking. Pt unable to tolreate repeated extension. Pt with tightness noted in bilateral lumbar paraspinals. Skilled PT needed to address pt's impairments with the below interventions.    Personal Factors and Comorbidities Comorbidity 3+    Comorbidities CAD, HTN, DM2, vertigo, anxiety, hyperlipidemia, gout    Examination-Activity Limitations Lift;Bend;Other    Examination-Participation Restrictions Community Activity;Yard Work;Other    Stability/Clinical Decision Making Evolving/Moderate complexity    Clinical Decision Making Low    Rehab Potential Good    PT Frequency 2x / week    PT Duration 6 weeks    PT Treatment/Interventions Dry needling;Traction;Moist Heat;Electrical Stimulation;Cryotherapy;Ultrasound;Gait training;Stair training;Functional mobility training;Therapeutic activities;Therapeutic exercise;Balance training;Neuromuscular re-education;Patient/family education;Manual techniques;Passive range of motion;Taping    PT Next Visit Plan lumbar stretching, core strengthening, gentle abdominal strengthtening (pt with diastasis recti), STM to lumbar paraspinals    PT Home Exercise Plan Access Code: [email or web address]    Consulted and Agree with Plan of Care Patient           Patient will benefit from skilled therapeutic intervention in order to improve the following deficits and impairments:  Pain, Postural dysfunction, Decreased activity tolerance, Decreased strength, Decreased range of motion, Impaired  flexibility  Visit Diagnosis: Chronic midline low back pain without sciatica  Muscle weakness (generalized)  Difficulty in walking, not elsewhere classified     Problem List Patient Active Problem List   Diagnosis Date Noted  . Diabetes mellitus with coincident hypertension ([place]) 09/16/2019  . Coronary artery disease involving native coronary artery of native heart without angina pectoris 09/16/2019  . Pure hypercholesterolemia 09/16/2019  . Incarcerated umbilical hernia 45/80/9983    Oretha Caprice PT, MPT 07/14/2020, 12:55 PM  [facility] [place], [place], [address] Phone: [phone]   Fax:  [phone]  Name: Nickalos Petersen MRN: [id number] Date of Birth: May 16, 1946

## 2020-07-17 ENCOUNTER — Ambulatory Visit: Payer: Medicare HMO | Attending: Orthopedic Surgery | Admitting: Physical Therapy

## 2020-07-17 ENCOUNTER — Other Ambulatory Visit: Payer: Self-pay

## 2020-07-17 DIAGNOSIS — R262 Difficulty in walking, not elsewhere classified: Secondary | ICD-10-CM

## 2020-07-17 DIAGNOSIS — G8929 Other chronic pain: Secondary | ICD-10-CM | POA: Insufficient documentation

## 2020-07-17 DIAGNOSIS — M6281 Muscle weakness (generalized): Secondary | ICD-10-CM | POA: Diagnosis not present

## 2020-07-17 DIAGNOSIS — M545 Low back pain, unspecified: Secondary | ICD-10-CM

## 2020-07-17 NOTE — Therapy (Signed)
[facility] [place], [place], [address] Phone: [phone]   Fax:  [phone]  Physical Therapy Treatment  Patient Details  Name: John Wilson MRN: [id number] Date of Birth: Jan 09, 1946 Referring Provider (PT): Cleta Alberts, [place]   Encounter Date: 07/17/2020   PT End of Session - 07/17/20 3825    Visit Number 2    Number of Visits 13    Date for PT Re-Evaluation 08/28/20    Authorization Type KX modifier at visit 15, PN every 10th visit    Progress Note Due on Visit 10    PT Start Time 0815    PT Stop Time 0906    PT Time Calculation (min) 51 min    Activity Tolerance Patient tolerated treatment well    Behavior During Therapy [facility] for tasks assessed/performed           Past Medical History:  Diagnosis Date  . CAD (coronary artery disease)   . Diabetes ([place])   . ED (erectile dysfunction)   . Gout   . Hyperlipidemia   . Hypertension   . Obesity   . Vertigo     Past Surgical History:  Procedure Laterality Date  . CHOLECYSTECTOMY  1981  . CORONARY ANGIOPLASTY WITH STENT PLACEMENT  2001   2 separate surgeries, 1 month apart  . HERNIA REPAIR  0539   Umbilical  . LEFT HEART CATH AND CORONARY ANGIOGRAPHY N/A 03/16/2020   Procedure: LEFT HEART CATH AND CORONARY ANGIOGRAPHY;  Surgeon: Troy Sine, MD;  Location: [place] [facility];  Service: Cardiovascular;  Laterality: N/A;  . PILONIDAL CYST EXCISION  1979    There were no vitals filed for this visit.   Subjective Assessment - 07/17/20 0821    Subjective Covid -19 screen performed upon arrival. Pt reporting stiffness when he woke up this morning and pain of 5/10 upon arrival. Pt stated compliance with his HEP.    Pertinent History CAD, DM2, gout, HTN, vertigo, gout, hyperlipidemia, HTN, cyst removed from spine, naval hernia, 2 cardiac stents, gall bladder removal.    Limitations House hold activities;Other (comment);Standing    Diagnostic tests X-ray     Patient Stated Goals Stop hurting, being able to bend over without pain, be able to sleep comfortable.    Currently in Pain? Yes    Pain Score 5     Pain Location Back    Pain Orientation Lower;Right    Pain Type Chronic pain    Pain Onset More than a month ago                             [facility] Adult PT Treatment/Exercise - 07/17/20 0001      Exercises   Exercises Lumbar      Lumbar Exercises: Stretches   Active Hamstring Stretch Right;Left;3 reps;30 seconds    Single Knee to Chest Stretch 3 reps;20 seconds    Lower Trunk Rotation 3 reps;30 seconds    Hip Flexor Stretch 1 rep;60 seconds    Piriformis Stretch Right;Left;2 reps;30 seconds    Gastroc Stretch 3 reps;30 seconds    Gastroc Stretch Limitations on rocker board      Lumbar Exercises: Aerobic   Nustep L4 x 6 minutes      Lumbar Exercises: Standing   Row --    Row Limitations --    Other Standing Lumbar Exercises mini squats x 10      Lumbar Exercises: Supine  Bridge 10 reps;3 seconds    Straight Leg Raise 10 reps      Lumbar Exercises: Sidelying   Clam Both;10 reps      Modalities   Modalities Moist Heat      Moist Heat Therapy   Number Minutes Moist Heat 5 Minutes    Moist Heat Location Lumbar Spine      Manual Therapy   Manual Therapy Soft tissue mobilization    Manual therapy comments 6 minutes    Soft tissue mobilization IASTM to lumbar paraspinals                        PT Long Term Goals - 07/17/20 6834      PT LONG TERM GOAL #1   Title Pt will be able to demonstrate proper body mechanics for posture correction and lifting techniques.    Status On-going      PT LONG TERM GOAL #2   Title Independent with advanced HEP    Status On-going      PT LONG TERM GOAL #3   Title Perform ADLs with pain </= 3/10.    Status On-going      PT LONG TERM GOAL #4   Title Pt will be able to amb 15 minutes with pain </= 3/10.    Status On-going                  Plan - 07/17/20 0856    Clinical Impression Statement Pt arriving today reporting 5/10 pain. PT focused on lumbar/core strengthening and stretching this visit. IASTM performed to lumbar paraspinals followed by moist heat. Pt reproting less stiffness at end of session. Discussion of pt getting a TENS unit for home use. Continue skilled PT.    Comorbidities CAD, HTN, DM2, vertigo, anxiety, hyperlipidemia, gout, diastasis recti    Examination-Activity Limitations Lift;Bend;Other    Examination-Participation Restrictions Community Activity;Yard Work;Other    Stability/Clinical Decision Making Evolving/Moderate complexity    Rehab Potential Good    PT Frequency 2x / week    PT Duration 6 weeks    PT Treatment/Interventions Dry needling;Traction;Moist Heat;Electrical Stimulation;Cryotherapy;Ultrasound;Gait training;Stair training;Functional mobility training;Therapeutic activities;Therapeutic exercise;Balance training;Neuromuscular re-education;Patient/family education;Manual techniques;Passive range of motion;Taping    PT Next Visit Plan lumbar stretching, core strengthening, gentle abdominal strengthtening (pt with diastasis recti), STM to lumbar paraspinals    PT Home Exercise Plan Access Code: [email or web address]    Consulted and Agree with Plan of Care Patient           Patient will benefit from skilled therapeutic intervention in order to improve the following deficits and impairments:  Pain, Postural dysfunction, Decreased activity tolerance, Decreased strength, Decreased range of motion, Impaired flexibility  Visit Diagnosis: Chronic midline low back pain without sciatica  Muscle weakness (generalized)  Difficulty in walking, not elsewhere classified     Problem List Patient Active Problem List   Diagnosis Date Noted  . Diabetes mellitus with coincident hypertension ([place]) 09/16/2019  . Coronary artery disease involving native coronary artery of native heart without angina pectoris 09/16/2019   . Pure hypercholesterolemia 09/16/2019  . Incarcerated umbilical hernia 98/92/1194    Oretha Caprice, PT, MPT 07/17/2020, 9:00 AM  [facility] [place], [place], [address] Phone: [phone]   Fax:  [phone]  Name: John Wilson MRN: [id number] Date of Birth: 21-Jan-1946

## 2020-07-21 ENCOUNTER — Other Ambulatory Visit: Payer: Self-pay

## 2020-07-21 ENCOUNTER — Ambulatory Visit: Payer: Medicare HMO | Admitting: Physical Therapy

## 2020-07-21 DIAGNOSIS — R262 Difficulty in walking, not elsewhere classified: Secondary | ICD-10-CM

## 2020-07-21 DIAGNOSIS — M6281 Muscle weakness (generalized): Secondary | ICD-10-CM

## 2020-07-21 DIAGNOSIS — G8929 Other chronic pain: Secondary | ICD-10-CM | POA: Diagnosis not present

## 2020-07-21 DIAGNOSIS — M545 Low back pain: Secondary | ICD-10-CM | POA: Diagnosis not present

## 2020-07-21 NOTE — Therapy (Signed)
[facility] [place], [place], [address] Phone: [phone]   Fax:  [phone]  Physical Therapy Treatment  Patient Details  Name: John Wilson MRN: [id number] Date of Birth: 19-Nov-1945 Referring Provider (PT): Cleta Alberts, [place]   Encounter Date: 07/21/2020   PT End of Session - 07/21/20 0825    Visit Number 3    Number of Visits 13    Date for PT Re-Evaluation 08/28/20    Authorization Type KX modifier at visit 15, PN every 10th visit    PT Start Time 0815    PT Stop Time 0910    PT Time Calculation (min) 55 min    Activity Tolerance Patient tolerated treatment well    Behavior During Therapy [facility] for tasks assessed/performed           Past Medical History:  Diagnosis Date  . CAD (coronary artery disease)   . Diabetes ([place])   . ED (erectile dysfunction)   . Gout   . Hyperlipidemia   . Hypertension   . Obesity   . Vertigo     Past Surgical History:  Procedure Laterality Date  . CHOLECYSTECTOMY  1981  . CORONARY ANGIOPLASTY WITH STENT PLACEMENT  2001   2 separate surgeries, 1 month apart  . HERNIA REPAIR  2376   Umbilical  . LEFT HEART CATH AND CORONARY ANGIOGRAPHY N/A 03/16/2020   Procedure: LEFT HEART CATH AND CORONARY ANGIOGRAPHY;  Surgeon: Troy Sine, MD;  Location: [place] [facility];  Service: Cardiovascular;  Laterality: N/A;  . PILONIDAL CYST EXCISION  1979    There were no vitals filed for this visit.   Subjective Assessment - 07/21/20 0824    Subjective COVID-19 screening performed upon arrival. Reports ongoing pain to right side of low back with tightness and firmness in the muscle.    Pertinent History CAD, DM2, gout, HTN, vertigo, gout, hyperlipidemia, HTN, cyst removed from spine, naval hernia, 2 cardiac stents, gall bladder removal.    Limitations House hold activities;Other (comment);Standing    How long can you sit comfortably? unlimited    Diagnostic tests X-ray    Patient Stated  Goals Stop hurting, being able to bend over without pain, be able to sleep comfortable.    Currently in Pain? Yes    Pain Score 6     Pain Location Back    Pain Orientation Right;Lower    Pain Descriptors / Indicators Aching;Tightness    Pain Type Chronic pain    Pain Onset More than a month ago    Pain Frequency Intermittent              [facility] PT Assessment - 07/21/20 0001      Assessment   Medical Diagnosis Low back pain R>L M54.5    Referring Provider (PT) Estill Bamberg Ward, PA    Hand Dominance Right    Prior Therapy yes, over 1 year ago for sciatica                         [facility] Adult PT Treatment/Exercise - 07/21/20 0001      Exercises   Exercises Lumbar      Lumbar Exercises: Stretches   Single Knee to Chest Stretch 3 reps;30 seconds    Lower Trunk Rotation 3 reps;30 seconds    Gastroc Stretch Other (comment)   x3 mins     Lumbar Exercises: Aerobic   Nustep L6 x 10 minutes  Lumbar Exercises: Standing   Row Strengthening;Both;20 reps    Row Limitations blue XTS    Other Standing Lumbar Exercises chop/lift 2x10 blue XTS      Lumbar Exercises: Supine   Bridge --      Modalities   Modalities Moist Heat;Electrical Stimulation      Moist Heat Therapy   Number Minutes Moist Heat 10 Minutes    Moist Heat Location Lumbar Spine      Electrical Stimulation   Electrical Stimulation Location R low back    Electrical Stimulation Action pre-mod    Electrical Stimulation Parameters 80-150 hz  x10 mins    Electrical Stimulation Goals Pain;Tone      Manual Therapy   Manual Therapy Soft tissue mobilization    Soft tissue mobilization IASTM to R lumbar paraspinals and QL in left sidelying                       PT Long Term Goals - 07/17/20 1610      PT LONG TERM GOAL #1   Title Pt will be able to demonstrate proper body mechanics for posture correction and lifting techniques.    Status On-going      PT LONG TERM GOAL #2   Title  Independent with advanced HEP    Status On-going      PT LONG TERM GOAL #3   Title Perform ADLs with pain </= 3/10.    Status On-going      PT LONG TERM GOAL #4   Title Pt will be able to amb 15 minutes with pain </= 3/10.    Status On-going                 Plan - 07/21/20 0901    Clinical Impression Statement Patient responded well to therapy session with good form and technique. Patient with significant R QL tone. STW/M, IASTM and trigger point release performed with slight decrease of tone. TENS unit applied with normal response. Patient provided with DN consent form and instructed to review, sign, and return next visit.    Personal Factors and Comorbidities Comorbidity 3+    Comorbidities CAD, HTN, DM2, vertigo, anxiety, hyperlipidemia, gout, diastasis recti    Examination-Activity Limitations Lift;Bend;Other    Examination-Participation Restrictions Community Activity;Yard Work;Other    Stability/Clinical Decision Making Evolving/Moderate complexity    Clinical Decision Making Low    Rehab Potential Good    PT Frequency 2x / week    PT Duration 6 weeks    PT Treatment/Interventions Dry needling;Traction;Moist Heat;Electrical Stimulation;Cryotherapy;Ultrasound;Gait training;Stair training;Functional mobility training;Therapeutic activities;Therapeutic exercise;Balance training;Neuromuscular re-education;Patient/family education;Manual techniques;Passive range of motion;Taping    PT Next Visit Plan lumbar stretching, core strengthening, gentle abdominal strengthtening (pt with diastasis recti), STM to lumbar paraspinals    PT Home Exercise Plan Access Code: [email or web address]    Consulted and Agree with Plan of Care Patient           Patient will benefit from skilled therapeutic intervention in order to improve the following deficits and impairments:  Pain, Postural dysfunction, Decreased activity tolerance, Decreased strength, Decreased range of motion, Impaired flexibility  Visit  Diagnosis: Chronic midline low back pain without sciatica  Muscle weakness (generalized)  Difficulty in walking, not elsewhere classified     Problem List Patient Active Problem List   Diagnosis Date Noted  . Diabetes mellitus with coincident hypertension ([place]) 09/16/2019  . Coronary artery disease involving native coronary artery of native heart without angina pectoris 09/16/2019  .  Pure hypercholesterolemia 09/16/2019  . Incarcerated umbilical hernia 70/17/7939    Gabriela Eves, PT, DPT 07/21/2020, 10:31 AM  [facility] [place], [place], [address] Phone: [phone]   Fax:  [phone]  Name: John Wilson MRN: [id number] Date of Birth: September 13, 1946

## 2020-07-23 ENCOUNTER — Other Ambulatory Visit: Payer: Self-pay

## 2020-07-23 ENCOUNTER — Ambulatory Visit: Payer: Medicare HMO | Admitting: Physical Therapy

## 2020-07-23 DIAGNOSIS — G8929 Other chronic pain: Secondary | ICD-10-CM | POA: Diagnosis not present

## 2020-07-23 DIAGNOSIS — R262 Difficulty in walking, not elsewhere classified: Secondary | ICD-10-CM | POA: Diagnosis not present

## 2020-07-23 DIAGNOSIS — M6281 Muscle weakness (generalized): Secondary | ICD-10-CM

## 2020-07-23 DIAGNOSIS — M545 Low back pain: Secondary | ICD-10-CM | POA: Diagnosis not present

## 2020-07-23 NOTE — Therapy (Signed)
John Wilson, [place], [address] Phone: [phone]   Fax:  [phone]  Physical Therapy Treatment  Patient Details  Name: John Wilson MRN: [id number] Date of Birth: 1946/07/06 Referring Provider (PT): Cleta Alberts, [place]   Encounter Date: 07/23/2020   PT End of Session - 07/23/20 3419    Visit Number 4    Number of Visits 13    Date for PT Re-Evaluation 08/28/20    Authorization Type KX modifier at visit 15, PN every 10th visit    PT Start Time 0817    PT Stop Time 0909    PT Time Calculation (min) 52 min    Activity Tolerance Patient tolerated treatment well    Behavior During Therapy [facility] for tasks assessed/performed           Past Medical History:  Diagnosis Date  . CAD (coronary artery disease)   . Diabetes ([place])   . ED (erectile dysfunction)   . Gout   . Hyperlipidemia   . Hypertension   . Obesity   . Vertigo     Past Surgical History:  Procedure Laterality Date  . CHOLECYSTECTOMY  1981  . CORONARY ANGIOPLASTY WITH STENT PLACEMENT  2001   2 separate surgeries, 1 month apart  . HERNIA REPAIR  3790   Umbilical  . LEFT HEART CATH AND CORONARY ANGIOGRAPHY N/A 03/16/2020   Procedure: LEFT HEART CATH AND CORONARY ANGIOGRAPHY;  Surgeon: Troy Sine, MD;  Location: [place] [facility];  Service: Cardiovascular;  Laterality: N/A;  . PILONIDAL CYST EXCISION  1979    There were no vitals filed for this visit.   Subjective Assessment - 07/23/20 0937    Subjective COVID-19 screen performed prior to patient entering clinic.  Good days bad days.  Overall, about the same.    Pertinent History CAD, DM2, gout, HTN, vertigo, gout, hyperlipidemia, HTN, cyst removed from spine, naval hernia, 2 cardiac stents, gall bladder removal.    Diagnostic tests X-ray    Patient Stated Goals Stop hurting, being able to bend over without pain, be able to sleep comfortable.    Currently in Pain? Yes    Pain Score 6      Pain Location Back    Pain Orientation Right;Lower    Pain Descriptors / Indicators Aching;Tightness    Pain Type Chronic pain    Pain Onset More than a month ago                             [facility] Adult PT Treatment/Exercise - 07/23/20 0001      Exercises   Exercises Lumbar      Lumbar Exercises: Aerobic   Nustep Level 6 x 15 minutes.      Modalities   Modalities Electrical Stimulation;Moist Heat;Ultrasound      Moist Heat Therapy   Number Minutes Moist Heat 20 Minutes    Moist Heat Location Lumbar Spine      Electrical Stimulation   Electrical Stimulation Location Affected low back.    Electrical Stimulation Action IFC    Electrical Stimulation Parameters 80-150 Hz x 20 minutes.    Electrical Stimulation Goals Tone;Pain      Ultrasound   Ultrasound Location Affected low back.    Ultrasound Parameters Combo e'stim/US at 1.50 W/CM2 x 8 minutes.  PT Long Term Goals - 07/17/20 1470      PT LONG TERM GOAL #1   Title Pt will be able to demonstrate proper body mechanics for posture correction and lifting techniques.    Status On-going      PT LONG TERM GOAL #2   Title Independent with advanced HEP    Status On-going      PT LONG TERM GOAL #3   Title Perform ADLs with pain </= 3/10.    Status On-going      PT LONG TERM GOAL #4   Title Pt will be able to amb 15 minutes with pain </= 3/10.    Status On-going                 Plan - 07/23/20 9295    Clinical Impression Statement Patient having good and bad days but really not getting any lasting releif from treatments at this point.    Personal Factors and Comorbidities Comorbidity 3+    Comorbidities CAD, HTN, DM2, vertigo, anxiety, hyperlipidemia, gout, diastasis recti    Examination-Participation Restrictions Community Activity;Yard Work;Other    Rehab Potential Good    PT Frequency 2x / week    PT Duration 6 weeks    PT Treatment/Interventions Dry  needling;Traction;Moist Heat;Electrical Stimulation;Cryotherapy;Ultrasound;Gait training;Stair training;Functional mobility training;Therapeutic activities;Therapeutic exercise;Balance training;Neuromuscular re-education;Patient/family education;Manual techniques;Passive range of motion;Taping    PT Next Visit Plan May try intermittment lumbar traction at 35% body weight.    Consulted and Agree with Plan of Care Patient           Patient will benefit from skilled therapeutic intervention in order to improve the following deficits and impairments:  Pain, Postural dysfunction, Decreased activity tolerance, Decreased strength, Decreased range of motion, Impaired flexibility  Visit Diagnosis: Chronic midline low back pain without sciatica  Muscle weakness (generalized)  Difficulty in walking, not elsewhere classified     Problem List Patient Active Problem List   Diagnosis Date Noted  . Diabetes mellitus with coincident hypertension ([place]) 09/16/2019  . Coronary artery disease involving native coronary artery of native heart without angina pectoris 09/16/2019  . Pure hypercholesterolemia 09/16/2019  . Incarcerated umbilical hernia 74/73/4037    John Wilson, [name] 07/23/2020, 9:44 AM  [facility] [place], [place], [address] Phone: [phone]   Fax:  [phone]  Name: John Wilson MRN: [id number] Date of Birth: Jun 10, 1946

## 2020-07-24 DIAGNOSIS — B351 Tinea unguium: Secondary | ICD-10-CM | POA: Diagnosis not present

## 2020-07-24 DIAGNOSIS — E782 Mixed hyperlipidemia: Secondary | ICD-10-CM | POA: Diagnosis not present

## 2020-07-24 DIAGNOSIS — G63 Polyneuropathy in diseases classified elsewhere: Secondary | ICD-10-CM | POA: Diagnosis not present

## 2020-07-24 DIAGNOSIS — I2510 Atherosclerotic heart disease of native coronary artery without angina pectoris: Secondary | ICD-10-CM | POA: Diagnosis not present

## 2020-07-24 DIAGNOSIS — E119 Type 2 diabetes mellitus without complications: Secondary | ICD-10-CM | POA: Diagnosis not present

## 2020-07-24 DIAGNOSIS — I10 Essential (primary) hypertension: Secondary | ICD-10-CM | POA: Diagnosis not present

## 2020-07-28 ENCOUNTER — Other Ambulatory Visit: Payer: Self-pay

## 2020-07-28 ENCOUNTER — Ambulatory Visit: Payer: Medicare HMO | Admitting: Physical Therapy

## 2020-07-28 DIAGNOSIS — M6281 Muscle weakness (generalized): Secondary | ICD-10-CM | POA: Diagnosis not present

## 2020-07-28 DIAGNOSIS — G8929 Other chronic pain: Secondary | ICD-10-CM | POA: Diagnosis not present

## 2020-07-28 DIAGNOSIS — M545 Low back pain: Secondary | ICD-10-CM | POA: Diagnosis not present

## 2020-07-28 DIAGNOSIS — R262 Difficulty in walking, not elsewhere classified: Secondary | ICD-10-CM | POA: Diagnosis not present

## 2020-07-28 NOTE — Therapy (Signed)
[facility] [place], [place], [address] Phone: [phone]   Fax:  [phone]  Physical Therapy Treatment  Patient Details  Name: John Wilson MRN: [id number] Date of Birth: 18-Sep-1946 Referring Provider (PT): Cleta Alberts, [place]   Encounter Date: 07/28/2020   PT End of Session - 07/28/20 0909    Visit Number 5    Number of Visits 13    Date for PT Re-Evaluation 08/28/20    Authorization Type KX modifier at visit 15, PN every 10th visit    PT Start Time 0816    PT Stop Time 0910    PT Time Calculation (min) 54 min    Activity Tolerance Patient tolerated treatment well    Behavior During Therapy [facility] for tasks assessed/performed           Past Medical History:  Diagnosis Date  . CAD (coronary artery disease)   . Diabetes ([place])   . ED (erectile dysfunction)   . Gout   . Hyperlipidemia   . Hypertension   . Obesity   . Vertigo     Past Surgical History:  Procedure Laterality Date  . CHOLECYSTECTOMY  1981  . CORONARY ANGIOPLASTY WITH STENT PLACEMENT  2001   2 separate surgeries, 1 month apart  . HERNIA REPAIR  7121   Umbilical  . LEFT HEART CATH AND CORONARY ANGIOGRAPHY N/A 03/16/2020   Procedure: LEFT HEART CATH AND CORONARY ANGIOGRAPHY;  Surgeon: Troy Sine, MD;  Location: [place] [facility];  Service: Cardiovascular;  Laterality: N/A;  . PILONIDAL CYST EXCISION  1979    There were no vitals filed for this visit.   Subjective Assessment - 07/28/20 0857    Subjective COVID-19 screen performed prior to patient entering clinic.  Sore today.    Pertinent History CAD, DM2, gout, HTN, vertigo, gout, hyperlipidemia, HTN, cyst removed from spine, naval hernia, 2 cardiac stents, gall bladder removal.    Limitations House hold activities;Other (comment);Standing    How long can you sit comfortably? unlimited    Diagnostic tests X-ray    Patient Stated Goals Stop hurting, being able to bend over without pain, be  able to sleep comfortable.    Currently in Pain? Yes    Pain Score 5     Pain Location Back    Pain Orientation Right;Lower    Pain Descriptors / Indicators Sore    Pain Type Chronic pain    Pain Onset More than a month ago                             [facility] Adult PT Treatment/Exercise - 07/28/20 0001      Exercises   Exercises Knee/Hip      Lumbar Exercises: Aerobic   Nustep Level 6 x 16 minutes.      Lumbar Exercises: Machines for Strengthening   Cybex Lumbar Extension 60# x 4 minutes.    Other Lumbar Machine Exercise Ab machine at 60# x 4 minutes.      Lumbar Exercises: Supine   Bridge Limitations Hip bridges to fatigue.      Modalities   Modalities Electrical Stimulation;Moist Heat      Moist Heat Therapy   Number Minutes Moist Heat 20 Minutes    Moist Heat Location Lumbar Spine      Electrical Stimulation   Electrical Stimulation Location LB    Electrical Stimulation Action IFC    Electrical Stimulation Parameters 80-150 Hz  x 20 minutes.                       PT Long Term Goals - 07/17/20 3094      PT LONG TERM GOAL #1   Title Pt will be able to demonstrate proper body mechanics for posture correction and lifting techniques.    Status On-going      PT LONG TERM GOAL #2   Title Independent with advanced HEP    Status On-going      PT LONG TERM GOAL #3   Title Perform ADLs with pain </= 3/10.    Status On-going      PT LONG TERM GOAL #4   Title Pt will be able to amb 15 minutes with pain </= 3/10.    Status On-going                 Plan - 07/28/20 0905    Clinical Impression Statement Patient did very well today with the addition of resisted lumbar extension and abdominal machines today.  Continued c/o low back pain but more "sore" today.    Personal Factors and Comorbidities Comorbidity 3+    Comorbidities CAD, HTN, DM2, vertigo, anxiety, hyperlipidemia, gout, diastasis recti           Patient will benefit  from skilled therapeutic intervention in order to improve the following deficits and impairments:     Visit Diagnosis: Chronic midline low back pain without sciatica  Muscle weakness (generalized)  Difficulty in walking, not elsewhere classified     Problem List Patient Active Problem List   Diagnosis Date Noted  . Diabetes mellitus with coincident hypertension ([place]) 09/16/2019  . Coronary artery disease involving native coronary artery of native heart without angina pectoris 09/16/2019  . Pure hypercholesterolemia 09/16/2019  . Incarcerated umbilical hernia 07/68/0881    Ghada Abbett, [name] 07/28/2020, 9:10 AM  [facility] [place], [place], [address] Phone: [phone]   Fax:  [phone]  Name: Nyquan Selbe MRN: [id number] Date of Birth: 1946/07/21

## 2020-07-30 ENCOUNTER — Other Ambulatory Visit: Payer: Self-pay

## 2020-07-30 ENCOUNTER — Encounter: Payer: Self-pay | Admitting: Physical Therapy

## 2020-07-30 ENCOUNTER — Ambulatory Visit: Payer: Medicare HMO | Admitting: Physical Therapy

## 2020-07-30 DIAGNOSIS — R262 Difficulty in walking, not elsewhere classified: Secondary | ICD-10-CM | POA: Diagnosis not present

## 2020-07-30 DIAGNOSIS — G8929 Other chronic pain: Secondary | ICD-10-CM | POA: Diagnosis not present

## 2020-07-30 DIAGNOSIS — M6281 Muscle weakness (generalized): Secondary | ICD-10-CM

## 2020-07-30 DIAGNOSIS — M545 Low back pain: Secondary | ICD-10-CM | POA: Diagnosis not present

## 2020-07-30 NOTE — Therapy (Signed)
[facility] [place], [place], [address] Phone: [phone]   Fax:  [phone]  Physical Therapy Treatment  Patient Details  Name: John Wilson MRN: [id number] Date of Birth: February 20, 1946 Referring Provider (PT): Cleta Alberts, [place]   Encounter Date: 07/30/2020   PT End of Session - 07/30/20 0830    Visit Number 6    Number of Visits 13    Date for PT Re-Evaluation 08/28/20    Authorization Type KX modifier at visit 15, PN every 10th visit    Progress Note Due on Visit 10    PT Start Time 0819    PT Stop Time 0905    PT Time Calculation (min) 46 min    Activity Tolerance Patient tolerated treatment well    Behavior During Therapy [facility] for tasks assessed/performed           Past Medical History:  Diagnosis Date  . CAD (coronary artery disease)   . Diabetes ([place])   . ED (erectile dysfunction)   . Gout   . Hyperlipidemia   . Hypertension   . Obesity   . Vertigo     Past Surgical History:  Procedure Laterality Date  . CHOLECYSTECTOMY  1981  . CORONARY ANGIOPLASTY WITH STENT PLACEMENT  2001   2 separate surgeries, 1 month apart  . HERNIA REPAIR  1062   Umbilical  . LEFT HEART CATH AND CORONARY ANGIOGRAPHY N/A 03/16/2020   Procedure: LEFT HEART CATH AND CORONARY ANGIOGRAPHY;  Surgeon: Troy Sine, MD;  Location: [place] [facility];  Service: Cardiovascular;  Laterality: N/A;  . PILONIDAL CYST EXCISION  1979    There were no vitals filed for this visit.   Subjective Assessment - 07/30/20 0822    Subjective COVID-19 screen performed prior to patient entering clinic. Reports he hasn't seen much difference with pain with current POC and interested in traction.    Pertinent History CAD, DM2, gout, HTN, vertigo, gout, hyperlipidemia, HTN, cyst removed from spine, naval hernia, 2 cardiac stents, gall bladder removal.    Limitations House hold activities;Other (comment);Standing    How long can you sit comfortably?  unlimited    Diagnostic tests X-ray    Patient Stated Goals Stop hurting, being able to bend over without pain, be able to sleep comfortable.    Currently in Pain? Yes    Pain Score --   No rating provided   Pain Location Back    Pain Orientation Right;Lower    Pain Descriptors / Indicators Discomfort    Pain Type Chronic pain    Pain Onset More than a month ago    Pain Frequency Intermittent              [facility] PT Assessment - 07/30/20 0001      Assessment   Medical Diagnosis Low back pain R>L M54.5    Referring Provider (PT) Estill Bamberg Ward, PA    Hand Dominance Right    Prior Therapy yes, over 1 year ago for sciatica                         [facility] Adult PT Treatment/Exercise - 07/30/20 0001      Lumbar Exercises: Aerobic   Nustep L3 x5 min      Lumbar Exercises: Machines for Strengthening   Cybex Lumbar Extension 60# x20 reps    Other Lumbar Machine Exercise Ab machine at 60# x20 reps      Lumbar Exercises:  Standing   Row Strengthening;Both;20 reps;Limitations    Row Limitations Blue XTS    Other Standing Lumbar Exercises B chop wood blue XTS x20 reps each      Lumbar Exercises: Supine   Bridge 15 reps;3 seconds      Modalities   Modalities Traction      Traction   Type of Traction Lumbar    Min (lbs) 5    Max (lbs) 84    Hold Time 99    Rest Time 5    Time 15                       PT Long Term Goals - 07/17/20 5465      PT LONG TERM GOAL #1   Title Pt will be able to demonstrate proper body mechanics for posture correction and lifting techniques.    Status On-going      PT LONG TERM GOAL #2   Title Independent with advanced HEP    Status On-going      PT LONG TERM GOAL #3   Title Perform ADLs with pain </= 3/10.    Status On-going      PT LONG TERM GOAL #4   Title Pt will be able to amb 15 minutes with pain </= 3/10.    Status On-going                 Plan - 07/30/20 1151    Clinical Impression Statement  Patient presented in clinic with reports of no improvement with current POC. Patient able to tolerate therex with limitations to Nustep due to B knee pain. No pain reported during therex session. Initial traction session today began at 84#.    Personal Factors and Comorbidities Comorbidity 3+    Comorbidities CAD, HTN, DM2, vertigo, anxiety, hyperlipidemia, gout, diastasis recti    Examination-Activity Limitations Lift;Bend;Other    Examination-Participation Restrictions Community Activity;Yard Work;Other    Stability/Clinical Decision Making Evolving/Moderate complexity    Rehab Potential Good    PT Frequency 2x / week    PT Duration 6 weeks    PT Treatment/Interventions Dry needling;Traction;Moist Heat;Electrical Stimulation;Cryotherapy;Ultrasound;Gait training;Stair training;Functional mobility training;Therapeutic activities;Therapeutic exercise;Balance training;Neuromuscular re-education;Patient/family education;Manual techniques;Passive range of motion;Taping    PT Next Visit Plan May try intermittment lumbar traction at 35% body weight.    PT Home Exercise Plan Access Code: [email or web address]    Consulted and Agree with Plan of Care Patient           Patient will benefit from skilled therapeutic intervention in order to improve the following deficits and impairments:  Pain, Postural dysfunction, Decreased activity tolerance, Decreased strength, Decreased range of motion, Impaired flexibility  Visit Diagnosis: Chronic midline low back pain without sciatica  Muscle weakness (generalized)  Difficulty in walking, not elsewhere classified     Problem List Patient Active Problem List   Diagnosis Date Noted  . Diabetes mellitus with coincident hypertension ([place]) 09/16/2019  . Coronary artery disease involving native coronary artery of native heart without angina pectoris 09/16/2019  . Pure hypercholesterolemia 09/16/2019  . Incarcerated umbilical hernia 12/75/1700    Standley Brooking,  PTA 07/30/2020, 12:04 PM  [facility] [place], [place], [address] Phone: [phone]   Fax:  [phone]  Name: John Wilson MRN: [id number] Date of Birth: 08/05/1946

## 2020-08-04 ENCOUNTER — Other Ambulatory Visit: Payer: Self-pay

## 2020-08-04 ENCOUNTER — Ambulatory Visit: Payer: Medicare HMO | Admitting: Physical Therapy

## 2020-08-04 DIAGNOSIS — M6281 Muscle weakness (generalized): Secondary | ICD-10-CM | POA: Diagnosis not present

## 2020-08-04 DIAGNOSIS — G8929 Other chronic pain: Secondary | ICD-10-CM

## 2020-08-04 DIAGNOSIS — R262 Difficulty in walking, not elsewhere classified: Secondary | ICD-10-CM | POA: Diagnosis not present

## 2020-08-04 DIAGNOSIS — M545 Low back pain: Secondary | ICD-10-CM | POA: Diagnosis not present

## 2020-08-04 NOTE — Therapy (Signed)
[facility] [place], [place], [address] Phone: [phone]   Fax:  [phone]  Physical Therapy Treatment  Patient Details  Name: John Wilson MRN: [id number] Date of Birth: 05-Oct-1946 Referring Provider (PT): Cleta Alberts, [place]   Encounter Date: 08/04/2020   PT End of Session - 08/04/20 0949    Visit Number 7    Number of Visits 13    Date for PT Re-Evaluation 08/28/20    Authorization Type KX modifier at visit 15, PN every 10th visit    Progress Note Due on Visit 10    PT Start Time 0815    PT Stop Time 0904    PT Time Calculation (min) 49 min    Activity Tolerance Patient tolerated treatment well    Behavior During Therapy [facility] for tasks assessed/performed           Past Medical History:  Diagnosis Date  . CAD (coronary artery disease)   . Diabetes ([place])   . ED (erectile dysfunction)   . Gout   . Hyperlipidemia   . Hypertension   . Obesity   . Vertigo     Past Surgical History:  Procedure Laterality Date  . CHOLECYSTECTOMY  1981  . CORONARY ANGIOPLASTY WITH STENT PLACEMENT  2001   2 separate surgeries, 1 month apart  . HERNIA REPAIR  7341   Umbilical  . LEFT HEART CATH AND CORONARY ANGIOGRAPHY N/A 03/16/2020   Procedure: LEFT HEART CATH AND CORONARY ANGIOGRAPHY;  Surgeon: Troy Sine, MD;  Location: [place] [facility];  Service: Cardiovascular;  Laterality: N/A;  . PILONIDAL CYST EXCISION  1979    There were no vitals filed for this visit.   Subjective Assessment - 08/04/20 0948    Subjective COVID-19 screen performed prior to patient entering clinic.  Traction helped a lot.    Pertinent History CAD, DM2, gout, HTN, vertigo, gout, hyperlipidemia, HTN, cyst removed from spine, naval hernia, 2 cardiac stents, gall bladder removal.    Limitations House hold activities;Other (comment);Standing    How long can you sit comfortably? unlimited    Diagnostic tests X-ray    Patient Stated Goals Stop  hurting, being able to bend over without pain, be able to sleep comfortable.    Currently in Pain? Yes    Pain Score 2     Pain Location Back    Pain Orientation Right;Lower    Pain Descriptors / Indicators Discomfort    Pain Onset More than a month ago                             [facility] Adult PT Treatment/Exercise - 08/04/20 0001      Exercises   Exercises Knee/Hip      Lumbar Exercises: Machines for Strengthening   Cybex Lumbar Extension 60# x 5 minutes.    Other Lumbar Machine Exercise Ab machine at 60# x 5 minutes.      Modalities   Modalities Electrical Stimulation;Traction      Moist Heat Therapy   Number Minutes Moist Heat 10 Minutes    Moist Heat Location Lumbar Spine      Electrical Stimulation   Electrical Stimulation Location LB    Electrical Stimulation Action IFC    Electrical Stimulation Parameters 80-150 Hz x 10 minutes.    Electrical Stimulation Goals Tone;Pain      Traction   Type of Traction Lumbar    Min (lbs) 5  Max (lbs) 95    Hold Time 99    Rest Time 5    Time 15                       PT Long Term Goals - 07/17/20 0630      PT LONG TERM GOAL #1   Title Pt will be able to demonstrate proper body mechanics for posture correction and lifting techniques.    Status On-going      PT LONG TERM GOAL #2   Title Independent with advanced HEP    Status On-going      PT LONG TERM GOAL #3   Title Perform ADLs with pain </= 3/10.    Status On-going      PT LONG TERM GOAL #4   Title Pt will be able to amb 15 minutes with pain </= 3/10.    Status On-going                 Plan - 08/04/20 0955    Clinical Impression Statement Excellent response to traction.  Patient states his low back pain-level as very low.  He did not want to do Nustep today as it bothers his knees.    Personal Factors and Comorbidities Comorbidity 3+    Comorbidities CAD, HTN, DM2, vertigo, anxiety, hyperlipidemia, gout, diastasis recti     Examination-Activity Limitations Lift;Bend;Other    Examination-Participation Restrictions Community Activity;Yard Work;Other    Stability/Clinical Decision Making Evolving/Moderate complexity    Rehab Potential Good    PT Frequency 2x / week    PT Duration 6 weeks    PT Treatment/Interventions Dry needling;Traction;Moist Heat;Electrical Stimulation;Cryotherapy;Ultrasound;Gait training;Stair training;Functional mobility training;Therapeutic activities;Therapeutic exercise;Balance training;Neuromuscular re-education;Patient/family education;Manual techniques;Passive range of motion;Taping    PT Next Visit Plan May try intermittment lumbar traction at 35% body weight.    Consulted and Agree with Plan of Care Patient           Patient will benefit from skilled therapeutic intervention in order to improve the following deficits and impairments:  Pain, Postural dysfunction, Decreased activity tolerance, Decreased strength, Decreased range of motion, Impaired flexibility  Visit Diagnosis: Chronic midline low back pain without sciatica  Muscle weakness (generalized)  Difficulty in walking, not elsewhere classified     Problem List Patient Active Problem List   Diagnosis Date Noted  . Diabetes mellitus with coincident hypertension ([place]) 09/16/2019  . Coronary artery disease involving native coronary artery of native heart without angina pectoris 09/16/2019  . Pure hypercholesterolemia 09/16/2019  . Incarcerated umbilical hernia 16/11/930    [name], [name] 08/04/2020, 9:57 AM  [facility] [place], [place], [address] Phone: [phone]   Fax:  [phone]  Name: John Wilson MRN: [id number] Date of Birth: June 13, 1946

## 2020-08-06 ENCOUNTER — Ambulatory Visit: Payer: Medicare HMO | Admitting: *Deleted

## 2020-08-06 ENCOUNTER — Other Ambulatory Visit: Payer: Self-pay

## 2020-08-06 DIAGNOSIS — M545 Low back pain: Secondary | ICD-10-CM | POA: Diagnosis not present

## 2020-08-06 DIAGNOSIS — R262 Difficulty in walking, not elsewhere classified: Secondary | ICD-10-CM | POA: Diagnosis not present

## 2020-08-06 DIAGNOSIS — M6281 Muscle weakness (generalized): Secondary | ICD-10-CM

## 2020-08-06 DIAGNOSIS — G8929 Other chronic pain: Secondary | ICD-10-CM

## 2020-08-06 NOTE — Therapy (Signed)
[facility] [place], [place], [address] Phone: [phone]   Fax:  [phone]  Physical Therapy Treatment  Patient Details  Name: John Wilson MRN: [id number] Date of Birth: [birth year] Referring Provider (PT): Cleta Alberts, [place]   Encounter Date: 08/06/2020   PT End of Session - 08/06/20 0350    Visit Number 8    Number of Visits 13    Date for PT Re-Evaluation 08/28/20    Authorization Type KX modifier at visit 66, PN every 10th visit    Progress Note Due on Visit 10    PT Start Time 0818    PT Stop Time 0907    PT Time Calculation (min) 49 min           Past Medical History:  Diagnosis Date  . CAD (coronary artery disease)   . Diabetes ([place])   . ED (erectile dysfunction)   . Gout   . Hyperlipidemia   . Hypertension   . Obesity   . Vertigo     Past Surgical History:  Procedure Laterality Date  . CHOLECYSTECTOMY  1981  . CORONARY ANGIOPLASTY WITH STENT PLACEMENT  2001   2 separate surgeries, 1 month apart  . HERNIA REPAIR  0938   Umbilical  . LEFT HEART CATH AND CORONARY ANGIOGRAPHY N/A 03/16/2020   Procedure: LEFT HEART CATH AND CORONARY ANGIOGRAPHY;  Surgeon: Troy Sine, MD;  Location: [place] [facility];  Service: Cardiovascular;  Laterality: N/A;  . PILONIDAL CYST EXCISION  1979    There were no vitals filed for this visit.   Subjective Assessment - 08/06/20 0828    Subjective COVID-19 screen performed prior to patient entering clinic.  Traction helped a lot.  LBP6-7/10 in AM. 2-3/10 now . 30-40-% better since starting MD    Pertinent History CAD, DM2, gout, HTN, vertigo, gout, hyperlipidemia, HTN, cyst removed from spine, naval hernia, 2 cardiac stents, gall bladder removal.    Limitations House hold activities;Other (comment);Standing    How long can you sit comfortably? unlimited    Diagnostic tests X-ray    Patient Stated Goals Stop hurting, being able to bend over without pain, be able to  sleep comfortable.    Currently in Pain? Yes    Pain Score 3     Pain Location Back    Pain Orientation Right;Lower    Pain Descriptors / Indicators Discomfort    Pain Type Chronic pain    Pain Onset More than a month ago    Pain Frequency Intermittent                             [facility] Adult PT Treatment/Exercise - 08/06/20 0001      Lumbar Exercises: Machines for Strengthening   Cybex Lumbar Extension 60# x 5 minutes.    Other Lumbar Machine Exercise Ab machine at 60# x 5 minutes.      Lumbar Exercises: Standing   Shoulder Extension Strengthening   Blue XTS x 3 mins     Modalities   Modalities Electrical Stimulation;Traction      Moist Heat Therapy   Number Minutes Moist Heat 10 Minutes    Moist Heat Location Lumbar Spine      Electrical Stimulation   Electrical Stimulation Location LB    Electrical Stimulation Action IFC    Electrical Stimulation Parameters 80-150hz  x 10 mins    Electrical Stimulation Goals Tone;Pain  Traction   Type of Traction Lumbar    Min (lbs) 5    Max (lbs) 105    Hold Time 99    Rest Time 5    Time 15                       PT Long Term Goals - 07/17/20 1610      PT LONG TERM GOAL #1   Title Pt will be able to demonstrate proper body mechanics for posture correction and lifting techniques.    Status On-going      PT LONG TERM GOAL #2   Title Independent with advanced HEP    Status On-going      PT LONG TERM GOAL #3   Title Perform ADLs with pain </= 3/10.    Status On-going      PT LONG TERM GOAL #4   Title Pt will be able to amb 15 minutes with pain </= 3/10.    Status On-going                 Plan - 08/06/20 0845    Clinical Impression Statement Pt arrived today doing fairly well and reports 30% better since starting PT. Rx focused on core strengthening f/b traction at 105 #'s today and tolerated well.    Comorbidities CAD, HTN, DM2, vertigo, anxiety, hyperlipidemia, gout, diastasis  recti    Stability/Clinical Decision Making Evolving/Moderate complexity    Rehab Potential Good    PT Frequency 2x / week    PT Duration 6 weeks    PT Treatment/Interventions Dry needling;Traction;Moist Heat;Electrical Stimulation;Cryotherapy;Ultrasound;Gait training;Stair training;Functional mobility training;Therapeutic activities;Therapeutic exercise;Balance training;Neuromuscular re-education;Patient/family education;Manual techniques;Passive range of motion;Taping    PT Next Visit Plan Assess traction    PT Home Exercise Plan Access Code: [email or web address]           Patient will benefit from skilled therapeutic intervention in order to improve the following deficits and impairments:  Pain, Postural dysfunction, Decreased activity tolerance, Decreased strength, Decreased range of motion, Impaired flexibility  Visit Diagnosis: Chronic midline low back pain without sciatica  Muscle weakness (generalized)  Difficulty in walking, not elsewhere classified     Problem List Patient Active Problem List   Diagnosis Date Noted  . Diabetes mellitus with coincident hypertension ([place]) 09/16/2019  . Coronary artery disease involving native coronary artery of native heart without angina pectoris 09/16/2019  . Pure hypercholesterolemia 09/16/2019  . Incarcerated umbilical hernia 81/19/1478    Perline Awe,CHRIS, PTA 08/06/2020, 9:32 AM  [facility] [place], [place], [address] Phone: [phone]   Fax:  [phone]  Name: Megan Hayduk MRN: [id number] Date of Birth: Jan 30, 1946

## 2020-08-11 ENCOUNTER — Other Ambulatory Visit: Payer: Self-pay

## 2020-08-11 ENCOUNTER — Ambulatory Visit: Payer: Medicare HMO | Admitting: Physical Therapy

## 2020-08-11 DIAGNOSIS — M6281 Muscle weakness (generalized): Secondary | ICD-10-CM | POA: Diagnosis not present

## 2020-08-11 DIAGNOSIS — M545 Low back pain, unspecified: Secondary | ICD-10-CM

## 2020-08-11 DIAGNOSIS — R262 Difficulty in walking, not elsewhere classified: Secondary | ICD-10-CM

## 2020-08-11 DIAGNOSIS — G8929 Other chronic pain: Secondary | ICD-10-CM

## 2020-08-11 NOTE — Therapy (Signed)
[facility] [place], [place], [address] Phone: [phone]   Fax:  [phone]  Physical Therapy Treatment  Patient Details  Name: John Wilson MRN: [id number] Date of Birth: 07-Jan-1946 Referring Provider (PT): Cleta Alberts, [place]   Encounter Date: 08/11/2020   PT End of Session - 08/11/20 0857    Visit Number 9    Number of Visits 13    Date for PT Re-Evaluation 08/28/20    Authorization Type KX modifier at visit 15, PN every 10th visit    PT Start Time 0819    PT Stop Time 0909    PT Time Calculation (min) 50 min    Activity Tolerance Patient tolerated treatment well    Behavior During Therapy [facility] for tasks assessed/performed           Past Medical History:  Diagnosis Date  . CAD (coronary artery disease)   . Diabetes ([place])   . ED (erectile dysfunction)   . Gout   . Hyperlipidemia   . Hypertension   . Obesity   . Vertigo     Past Surgical History:  Procedure Laterality Date  . CHOLECYSTECTOMY  1981  . CORONARY ANGIOPLASTY WITH STENT PLACEMENT  2001   2 separate surgeries, 1 month apart  . HERNIA REPAIR  5027   Umbilical  . LEFT HEART CATH AND CORONARY ANGIOGRAPHY N/A 03/16/2020   Procedure: LEFT HEART CATH AND CORONARY ANGIOGRAPHY;  Surgeon: Troy Sine, MD;  Location: [place] [facility];  Service: Cardiovascular;  Laterality: N/A;  . PILONIDAL CYST EXCISION  1979    There were no vitals filed for this visit.   Subjective Assessment - 08/11/20 0843    Subjective COVID-19 screen performed prior to patient entering clinic.  Traction helping.  Hurting today because I mowed yesterday.    Pertinent History CAD, DM2, gout, HTN, vertigo, gout, hyperlipidemia, HTN, cyst removed from spine, naval hernia, 2 cardiac stents, gall bladder removal.    Limitations House hold activities;Other (comment);Standing    How long can you sit comfortably? unlimited    Diagnostic tests X-ray    Patient Stated Goals Stop  hurting, being able to bend over without pain, be able to sleep comfortable.    Currently in Pain? Yes    Pain Score 4     Pain Location Back    Pain Orientation Right    Pain Onset More than a month ago    Pain Frequency Intermittent                             [facility] Adult PT Treatment/Exercise - 08/11/20 0001      Exercises   Exercises Knee/Hip      Lumbar Exercises: Machines for Strengthening   Cybex Lumbar Extension 70# x 4 minutes.    Other Lumbar Machine Exercise Ab machine at 70# x 4 minutes.      Modalities   Modalities Electrical Stimulation;Moist Heat;Traction      Moist Heat Therapy   Number Minutes Moist Heat 10 Minutes    Moist Heat Location Lumbar Spine      Electrical Stimulation   Electrical Stimulation Location RT LB    Electrical Stimulation Action Pre-mod.    Electrical Stimulation Parameters 80-150 Hz x 10 minutes.    Electrical Stimulation Goals Tone;Pain      Traction   Type of Traction Lumbar    Min (lbs) 5    Max (lbs)  110    Hold Time 99    Rest Time 5    Time 15                       PT Long Term Goals - 07/17/20 6433      PT LONG TERM GOAL #1   Title Pt will be able to demonstrate proper body mechanics for posture correction and lifting techniques.    Status On-going      PT LONG TERM GOAL #2   Title Independent with advanced HEP    Status On-going      PT LONG TERM GOAL #3   Title Perform ADLs with pain </= 3/10.    Status On-going      PT LONG TERM GOAL #4   Title Pt will be able to amb 15 minutes with pain </= 3/10.    Status On-going                 Plan - 08/11/20 0859    Clinical Impression Statement Patient responding well to treatments and feels traction is helping quite a bit.  Slight increase in pain today due to mowing an acre yesterday.    Personal Factors and Comorbidities Comorbidity 3+    Comorbidities CAD, HTN, DM2, vertigo, anxiety, hyperlipidemia, gout, diastasis recti     Examination-Activity Limitations Lift;Bend;Other    Stability/Clinical Decision Making Evolving/Moderate complexity    Rehab Potential Good    PT Frequency 2x / week    PT Duration 6 weeks    PT Treatment/Interventions Dry needling;Traction;Moist Heat;Electrical Stimulation;Cryotherapy;Ultrasound;Gait training;Stair training;Functional mobility training;Therapeutic activities;Therapeutic exercise;Balance training;Neuromuscular re-education;Patient/family education;Manual techniques;Passive range of motion;Taping    Consulted and Agree with Plan of Care Patient           Patient will benefit from skilled therapeutic intervention in order to improve the following deficits and impairments:     Visit Diagnosis: Chronic midline low back pain without sciatica  Muscle weakness (generalized)  Difficulty in walking, not elsewhere classified     Problem List Patient Active Problem List   Diagnosis Date Noted  . Diabetes mellitus with coincident hypertension ([place]) 09/16/2019  . Coronary artery disease involving native coronary artery of native heart without angina pectoris 09/16/2019  . Pure hypercholesterolemia 09/16/2019  . Incarcerated umbilical hernia 29/51/8841    Smith Mcnicholas, [name] 08/11/2020, 9:11 AM  [facility] [place], [place], [address] Phone: [phone]   Fax:  [phone]  Name: John Wilson MRN: [id number] Date of Birth: 10/10/1946

## 2020-08-13 ENCOUNTER — Ambulatory Visit: Payer: Medicare HMO | Admitting: Physical Therapy

## 2020-08-13 ENCOUNTER — Encounter: Payer: Self-pay | Admitting: Physical Therapy

## 2020-08-13 ENCOUNTER — Other Ambulatory Visit: Payer: Self-pay

## 2020-08-13 DIAGNOSIS — M6281 Muscle weakness (generalized): Secondary | ICD-10-CM

## 2020-08-13 DIAGNOSIS — M545 Low back pain, unspecified: Secondary | ICD-10-CM

## 2020-08-13 DIAGNOSIS — G8929 Other chronic pain: Secondary | ICD-10-CM | POA: Diagnosis not present

## 2020-08-13 DIAGNOSIS — R262 Difficulty in walking, not elsewhere classified: Secondary | ICD-10-CM | POA: Diagnosis not present

## 2020-08-13 NOTE — Therapy (Signed)
[facility] [place], [place], [address] Phone: [phone]   Fax:  [phone]  Physical Therapy Treatment  Progress Note Reporting Period 07/14/2020 to 08/13/2020  See note below for Objective Data and Assessment of Progress/Goals. Patient has made progress with function and pain but exacerbation today due to unknown cause. Pain goals have been achieved.     Patient Details  Name: John Wilson MRN: [id number] Date of Birth: Jan 23, 1946 Referring Provider (PT): Cleta Alberts, [place]   Encounter Date: 08/13/2020   PT End of Session - 08/13/20 0901    Visit Number 10    Number of Visits 13    Date for PT Re-Evaluation 08/28/20    Authorization Type KX modifier at visit 15, PN every 10th visit    PT Start Time 0816    PT Stop Time 0906    PT Time Calculation (min) 50 min    Activity Tolerance Patient tolerated treatment well    Behavior During Therapy [facility] for tasks assessed/performed           Past Medical History:  Diagnosis Date  . CAD (coronary artery disease)   . Diabetes ([place])   . ED (erectile dysfunction)   . Gout   . Hyperlipidemia   . Hypertension   . Obesity   . Vertigo     Past Surgical History:  Procedure Laterality Date  . CHOLECYSTECTOMY  1981  . CORONARY ANGIOPLASTY WITH STENT PLACEMENT  2001   2 separate surgeries, 1 month apart  . HERNIA REPAIR  0962   Umbilical  . LEFT HEART CATH AND CORONARY ANGIOGRAPHY N/A 03/16/2020   Procedure: LEFT HEART CATH AND CORONARY ANGIOGRAPHY;  Surgeon: Troy Sine, MD;  Location: [facility];  Service: Cardiovascular;  Laterality: N/A;  . PILONIDAL CYST EXCISION  1979    There were no vitals filed for this visit.   Subjective Assessment - 08/13/20 0823    Subjective COVID-19 screen performed prior to patient entering clinic. Patient reports more right sided pain today. Requesting to hold off on traction and focus more on deep tissue.    Pertinent  History CAD, DM2, gout, HTN, vertigo, gout, hyperlipidemia, HTN, cyst removed from spine, naval hernia, 2 cardiac stents, gall bladder removal.    Limitations House hold activities;Other (comment);Standing    How long can you sit comfortably? unlimited    Diagnostic tests X-ray    Patient Stated Goals Stop hurting, being able to bend over without pain, be able to sleep comfortable.    Currently in Pain? Yes   did not provide number on pain scale             [facility] PT Assessment - 08/13/20 0001      Assessment   Medical Diagnosis Low back pain R>L M54.5    Referring Provider (PT) Cleta Alberts, PA    Hand Dominance Right    Prior Therapy yes, over 1 year ago for sciatica                         [facility] Adult PT Treatment/Exercise - 08/13/20 0001      Exercises   Exercises Knee/Hip      Lumbar Exercises: Machines for Strengthening   Cybex Lumbar Extension 50# x 4 minutes.    Other Lumbar Machine Exercise Ab machine at 50# x 4 minutes.      Modalities   Modalities Electrical Stimulation;Moist Heat;Traction  Moist Heat Therapy   Number Minutes Moist Heat 10 Minutes    Moist Heat Location Lumbar Spine      Electrical Stimulation   Electrical Stimulation Location RT LB    Electrical Stimulation Action pre-mod    Electrical Stimulation Parameters 80-150 hz x10 mins    Electrical Stimulation Goals Tone;Pain      Manual Therapy   Manual Therapy Soft tissue mobilization    Soft tissue mobilization STW/M and IASTM to R lumbar paraspinals and QL to decrease tone and pain                       PT Long Term Goals - 08/13/20 0856      PT LONG TERM GOAL #1   Title Pt will be able to demonstrate proper body mechanics for posture correction and lifting techniques.    Time 6    Period Weeks    Status On-going      PT LONG TERM GOAL #2   Title Independent with advanced HEP    Time 6    Period Weeks    Status Achieved      PT LONG TERM GOAL #3    Title Perform ADLs with pain </= 3/10.    Time 6    Period Weeks    Status Achieved      PT LONG TERM GOAL #4   Title Pt will be able to amb 15 minutes with pain </= 3/10.    Time 6    Period Weeks    Status Achieved                 Plan - 08/13/20 3151    Clinical Impression Statement Patient responded well to therapy session Lumbar and ab machine weight reduced to 50# due to increased pain. Patient able to complete with no complaints. STW/M and IASTM performed to R QL. Significant tone in R QL and lumbar paraspinals noted with a decrease in tone. Positional traction with bolster and L sidelying performed for about 8 mins during STW/m and IASTM with some discomfort but able to tolerate full time. Goals functional achieved. Assess body mechanics for lifting to assess LTG #1 next visit.    Personal Factors and Comorbidities Comorbidity 3+    Comorbidities CAD, HTN, DM2, vertigo, anxiety, hyperlipidemia, gout, diastasis recti    Examination-Activity Limitations Lift;Bend;Other    Examination-Participation Restrictions Community Activity;Yard Work;Other    Stability/Clinical Decision Making Evolving/Moderate complexity    Clinical Decision Making Low    Rehab Potential Good    PT Frequency 2x / week    PT Duration 6 weeks    PT Treatment/Interventions Dry needling;Traction;Moist Heat;Electrical Stimulation;Cryotherapy;Ultrasound;Gait training;Stair training;Functional mobility training;Therapeutic activities;Therapeutic exercise;Balance training;Neuromuscular re-education;Patient/family education;Manual techniques;Passive range of motion;Taping    PT Next Visit Plan Assess pain, continue Traction, lumbar and core strengthening. STW/M to R QL    Consulted and Agree with Plan of Care Patient           Patient will benefit from skilled therapeutic intervention in order to improve the following deficits and impairments:  Pain, Postural dysfunction, Decreased activity tolerance,  Decreased strength, Decreased range of motion, Impaired flexibility  Visit Diagnosis: Chronic midline low back pain without sciatica  Muscle weakness (generalized)  Difficulty in walking, not elsewhere classified     Problem List Patient Active Problem List   Diagnosis Date Noted  . Diabetes mellitus with coincident hypertension ([place]) 09/16/2019  . Coronary artery disease involving native coronary  artery of native heart without angina pectoris 09/16/2019  . Pure hypercholesterolemia 09/16/2019  . Incarcerated umbilical hernia 71/58/0638    Gabriela Eves, PT, DPT 08/13/2020, 9:15 AM  [facility] [place], [place], [address] Phone: [phone]   Fax:  [phone]  Name: Rogerick Baldwin MRN: [id number] Date of Birth: 04/10/1946

## 2020-08-18 ENCOUNTER — Other Ambulatory Visit: Payer: Self-pay

## 2020-08-18 ENCOUNTER — Ambulatory Visit: Payer: Medicare HMO | Attending: Orthopedic Surgery | Admitting: Physical Therapy

## 2020-08-18 DIAGNOSIS — M6281 Muscle weakness (generalized): Secondary | ICD-10-CM | POA: Insufficient documentation

## 2020-08-18 DIAGNOSIS — G8929 Other chronic pain: Secondary | ICD-10-CM | POA: Insufficient documentation

## 2020-08-18 DIAGNOSIS — M545 Low back pain, unspecified: Secondary | ICD-10-CM | POA: Insufficient documentation

## 2020-08-18 DIAGNOSIS — R262 Difficulty in walking, not elsewhere classified: Secondary | ICD-10-CM | POA: Diagnosis not present

## 2020-08-18 NOTE — Therapy (Signed)
[facility] [place], [place], [address] Phone: [phone]   Fax:  [phone]  Physical Therapy Treatment  Patient Details  Name: John Wilson MRN: [id number] Date of Birth: [birth year] Referring Provider (PT): Cleta Alberts, [place]   Encounter Date: 08/18/2020   PT End of Session - 08/18/20 0956    Visit Number 11    Number of Visits 13    Authorization Type KX modifier at visit 15, PN every 10th visit    PT Start Time 0817    PT Stop Time 0909    PT Time Calculation (min) 52 min    Activity Tolerance Patient tolerated treatment well    Behavior During Therapy [facility] for tasks assessed/performed           Past Medical History:  Diagnosis Date  . CAD (coronary artery disease)   . Diabetes ([place])   . ED (erectile dysfunction)   . Gout   . Hyperlipidemia   . Hypertension   . Obesity   . Vertigo     Past Surgical History:  Procedure Laterality Date  . CHOLECYSTECTOMY  1981  . CORONARY ANGIOPLASTY WITH STENT PLACEMENT  2001   2 separate surgeries, 1 month apart  . HERNIA REPAIR  4481   Umbilical  . LEFT HEART CATH AND CORONARY ANGIOGRAPHY N/A 03/16/2020   Procedure: LEFT HEART CATH AND CORONARY ANGIOGRAPHY;  Surgeon: Troy Sine, MD;  Location: [facility];  Service: Cardiovascular;  Laterality: N/A;  . PILONIDAL CYST EXCISION  1979    There were no vitals filed for this visit.   Subjective Assessment - 08/18/20 0921    Subjective COVID-19 screen performed prior to patient entering clinic. I went hunting and did okay.  The last treatment helped and so sdo the exercises.    Pertinent History CAD, DM2, gout, HTN, vertigo, gout, hyperlipidemia, HTN, cyst removed from spine, naval hernia, 2 cardiac stents, gall bladder removal.    Limitations House hold activities;Other (comment);Standing    How long can you sit comfortably? unlimited    Diagnostic tests X-ray    Patient Stated Goals Stop hurting, being able  to bend over without pain, be able to sleep comfortable.    Currently in Pain? Yes    Pain Score 4     Pain Location Back    Pain Orientation Right    Pain Type Chronic pain    Pain Onset More than a month ago                             [facility] Adult PT Treatment/Exercise - 08/18/20 0001      Exercises   Exercises Knee/Hip      Lumbar Exercises: Machines for Strengthening   Cybex Lumbar Extension 60# x 5 minutes.    Cybex Knee Flexion 30# x 5 minutes    Other Lumbar Machine Exercise Ab machine x 60# x 5 minutes.      Acupuncturist Location RT LB.    Electrical Stimulation Action Pre-mod.    Electrical Stimulation Parameters 80-150 Hz x 20 minutes.    Electrical Stimulation Goals Tone      Manual Therapy   Manual Therapy Soft tissue mobilization    Soft tissue mobilization STW/M x 8 minutes.                       PT Long Term Goals -  08/13/20 0856      PT LONG TERM GOAL #1   Title Pt will be able to demonstrate proper body mechanics for posture correction and lifting techniques.    Time 6    Period Weeks    Status On-going      PT LONG TERM GOAL #2   Title Independent with advanced HEP    Time 6    Period Weeks    Status Achieved      PT LONG TERM GOAL #3   Title Perform ADLs with pain </= 3/10.    Time 6    Period Weeks    Status Achieved      PT LONG TERM GOAL #4   Title Pt will be able to amb 15 minutes with pain </= 3/10.    Time 6    Period Weeks    Status Achieved                 Plan - 08/18/20 1287    Clinical Impression Statement Patient did great today.  Patient responded very well to STW/M today.    Personal Factors and Comorbidities Comorbidity 3+    Comorbidities CAD, HTN, DM2, vertigo, anxiety, hyperlipidemia, gout, diastasis recti    Examination-Activity Limitations Lift;Bend;Other    Examination-Participation Restrictions Community Activity;Yard Work;Other     Stability/Clinical Decision Making Evolving/Moderate complexity    Rehab Potential Good    PT Frequency 2x / week    PT Duration 6 weeks    PT Treatment/Interventions Dry needling;Traction;Moist Heat;Electrical Stimulation;Cryotherapy;Ultrasound;Gait training;Stair training;Functional mobility training;Therapeutic activities;Therapeutic exercise;Balance training;Neuromuscular re-education;Patient/family education;Manual techniques;Passive range of motion;Taping    PT Next Visit Plan Assess pain, continue Traction, lumbar and core strengthening. STW/M to R QL    Consulted and Agree with Plan of Care Patient           Patient will benefit from skilled therapeutic intervention in order to improve the following deficits and impairments:  Pain, Postural dysfunction, Decreased activity tolerance, Decreased strength, Decreased range of motion, Impaired flexibility  Visit Diagnosis: Chronic midline low back pain without sciatica  Muscle weakness (generalized)  Difficulty in walking, not elsewhere classified     Problem List Patient Active Problem List   Diagnosis Date Noted  . Diabetes mellitus with coincident hypertension ([place]) 09/16/2019  . Coronary artery disease involving native coronary artery of native heart without angina pectoris 09/16/2019  . Pure hypercholesterolemia 09/16/2019  . Incarcerated umbilical hernia 86/76/7209    Tameaka Eichhorn, [name] 08/18/2020, 9:59 AM  [facility] [place], [place], [address] Phone: [phone]   Fax:  [phone]  Name: Cleve Paolillo MRN: [id number] Date of Birth: [birth year]

## 2020-08-20 ENCOUNTER — Ambulatory Visit: Payer: Medicare HMO | Admitting: Physical Therapy

## 2020-08-20 ENCOUNTER — Other Ambulatory Visit: Payer: Self-pay

## 2020-08-20 DIAGNOSIS — G8929 Other chronic pain: Secondary | ICD-10-CM

## 2020-08-20 DIAGNOSIS — R262 Difficulty in walking, not elsewhere classified: Secondary | ICD-10-CM

## 2020-08-20 DIAGNOSIS — M6281 Muscle weakness (generalized): Secondary | ICD-10-CM

## 2020-08-20 DIAGNOSIS — M545 Low back pain, unspecified: Secondary | ICD-10-CM | POA: Diagnosis not present

## 2020-08-20 NOTE — Therapy (Signed)
[facility] [address] [place], [place], [address] Phone: [phone]   Fax:  [phone]  Physical Therapy Treatment  Patient Details  Name: John Wilson MRN: [id number] Date of Birth: 03/13/1946 Referring Provider (PT): Amanda Ward, PA   Encounter Date: 08/20/2020   PT End of Session - 08/20/20 0824    Visit Number 12    Number of Visits 13    Date for PT Re-Evaluation 08/28/20    Authorization Type KX modifier at visit 15, PN every 10th visit    PT Start Time 0815    PT Stop Time 0859    PT Time Calculation (min) 44 min    Activity Tolerance Patient tolerated treatment well    Behavior During Therapy [facility] for tasks assessed/performed           Past Medical History:  Diagnosis Date  . CAD (coronary artery disease)   . Diabetes ([facility])   . ED (erectile dysfunction)   . Gout   . Hyperlipidemia   . Hypertension   . Obesity   . Vertigo     Past Surgical History:  Procedure Laterality Date  . CHOLECYSTECTOMY  1981  . CORONARY ANGIOPLASTY WITH STENT PLACEMENT  2001   2 separate surgeries, 1 month apart  . HERNIA REPAIR  2015   Umbilical  . LEFT HEART CATH AND CORONARY ANGIOGRAPHY N/A 03/16/2020   Procedure: LEFT HEART CATH AND CORONARY ANGIOGRAPHY;  Surgeon: Kelly, Thomas A, MD;  Location: [facility];  Service: Cardiovascular;  Laterality: N/A;  . PILONIDAL CYST EXCISION  1979    There were no vitals filed for this visit.   Subjective Assessment - 08/20/20 0817    Subjective COVID-19 screen performed prior to patient entering clinic. Patient reported "not much change" in symptoms    Pertinent History CAD, DM2, gout, HTN, vertigo, gout, hyperlipidemia, HTN, cyst removed from spine, naval hernia, 2 cardiac stents, gall bladder removal.    Limitations House hold activities;Other (comment);Standing    How long can you sit comfortably? unlimited    Diagnostic tests X-ray    Patient Stated Goals Stop hurting, being  able to bend over without pain, be able to sleep comfortable.    Currently in Pain? Yes    Pain Score 4     Pain Location Back    Pain Orientation Right    Pain Descriptors / Indicators Discomfort    Pain Type Chronic pain    Pain Onset More than a month ago    Pain Frequency Intermittent    Aggravating Factors  bending and laying on right    Pain Relieving Factors sitting and movement                             [facility] Adult PT Treatment/Exercise - 08/20/20 0001      Lumbar Exercises: Machines for Strengthening   Cybex Lumbar Extension 60# x 5 minutes.    Other Lumbar Machine Exercise Ab machine x 60# x 5 minutes.      Lumbar Exercises: Standing   Other Standing Lumbar Exercises lifting technique with red swiss ball from waist to floor, good technique today      Electrical Stimulation   Electrical Stimulation Location RT LB.    Electrical Stimulation Action premod    Electrical Stimulation Parameters 80-150hz x15min    Electrical Stimulation Goals Tone;Pain      Manual Therapy   Manual Therapy Soft tissue   mobilization    Soft tissue mobilization manual STW to right low back to reduce pain and tone                       PT Long Term Goals - 08/20/20 0825      PT LONG TERM GOAL #1   Title Pt will be able to demonstrate proper body mechanics for posture correction and lifting techniques.    Baseline Met 08/20/20    Time 6    Period Weeks    Status Achieved      PT LONG TERM GOAL #2   Title Independent with advanced HEP    Time 6    Period Weeks    Status Achieved      PT LONG TERM GOAL #3   Title Perform ADLs with pain </= 3/10.    Time 6    Period Weeks    Status Achieved      PT LONG TERM GOAL #4   Title Pt will be able to amb 15 minutes with pain </= 3/10.    Time 6    Period Weeks    Status Achieved                 Plan - 08/20/20 0848    Clinical Impression Statement Patient met all current goals today and  requested DC to HEP. Patient independent with lifting technique and all ADL's/HEP.    Personal Factors and Comorbidities Comorbidity 3+    Comorbidities CAD, HTN, DM2, vertigo, anxiety, hyperlipidemia, gout, diastasis recti    Examination-Activity Limitations Lift;Bend;Other    Examination-Participation Restrictions Community Activity;Yard Work;Other    Stability/Clinical Decision Making Evolving/Moderate complexity    Rehab Potential Good    PT Frequency 2x / week    PT Duration 6 weeks    PT Treatment/Interventions Dry needling;Traction;Moist Heat;Electrical Stimulation;Cryotherapy;Ultrasound;Gait training;Stair training;Functional mobility training;Therapeutic activities;Therapeutic exercise;Balance training;Neuromuscular re-education;Patient/family education;Manual techniques;Passive range of motion;Taping    PT Next Visit Plan DC    Consulted and Agree with Plan of Care Patient           Patient will benefit from skilled therapeutic intervention in order to improve the following deficits and impairments:  Pain, Postural dysfunction, Decreased activity tolerance, Decreased strength, Decreased range of motion, Impaired flexibility  Visit Diagnosis: Chronic midline low back pain without sciatica  Muscle weakness (generalized)  Difficulty in walking, not elsewhere classified     Problem List Patient Active Problem List   Diagnosis Date Noted  . Diabetes mellitus with coincident hypertension ([place]) 09/16/2019  . Coronary artery disease involving native coronary artery of native heart without angina pectoris 09/16/2019  . Pure hypercholesterolemia 09/16/2019  . Incarcerated umbilical hernia 00/17/4944    John Wilson, PTA 08/20/20 9:07 AM  [facility] [place], [place], [address] Phone: [phone]   Fax:  [phone]  Name: John Wilson MRN: [id number] Date of Birth: 1946/01/13  PHYSICAL THERAPY DISCHARGE  SUMMARY  Visits from Start of Care: 11.  Current functional level related to goals / functional outcomes: See above.   Remaining deficits: Goals met.   Education / Equipment: HEP. Plan: Patient agrees to discharge.  Patient goals were met. Patient is being discharged due to meeting the stated rehab goals.  ?????          Mali Applegate MPT

## 2020-08-25 ENCOUNTER — Encounter: Payer: Medicare HMO | Admitting: *Deleted

## 2020-08-25 DIAGNOSIS — M545 Low back pain, unspecified: Secondary | ICD-10-CM | POA: Diagnosis not present

## 2020-08-27 ENCOUNTER — Encounter: Payer: Medicare HMO | Admitting: Physical Therapy

## 2020-09-07 IMAGING — US US AORTA
1 series · 14 of 25 positions shown · non-contrast
Comparison: None.

CLINICAL DATA: Abdominal aortic aneurysm

EXAM:
ULTRASOUND OF ABDOMINAL AORTA
TECHNIQUE: Ultrasound examination of the abdominal aorta and proximal common
iliac arteries was performed to evaluate for aneurysm. Additional
color and Doppler images of the distal aorta were obtained to
document patency.

[Series 1: us aorta · 0.32mm/px · 14 of 37 slices shown]
[im 1/37]
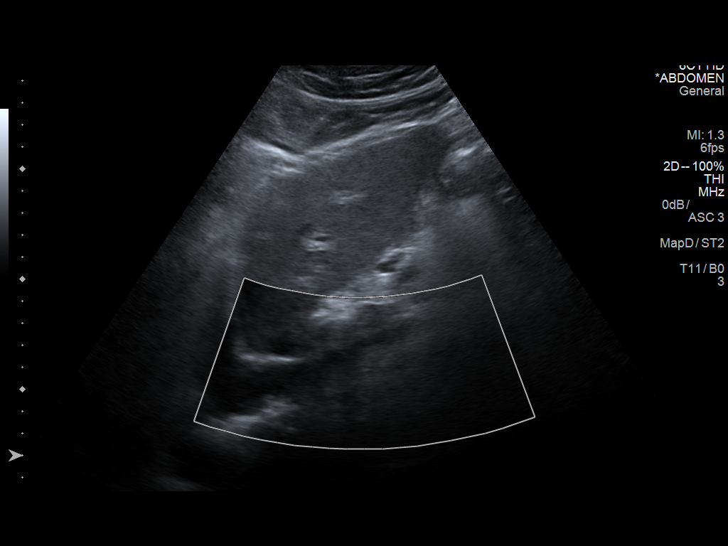
[im 4/37]
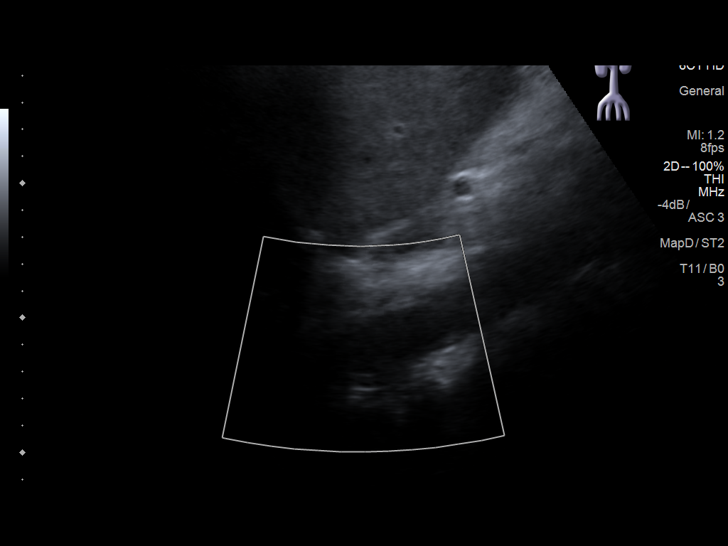
[im 7/37]
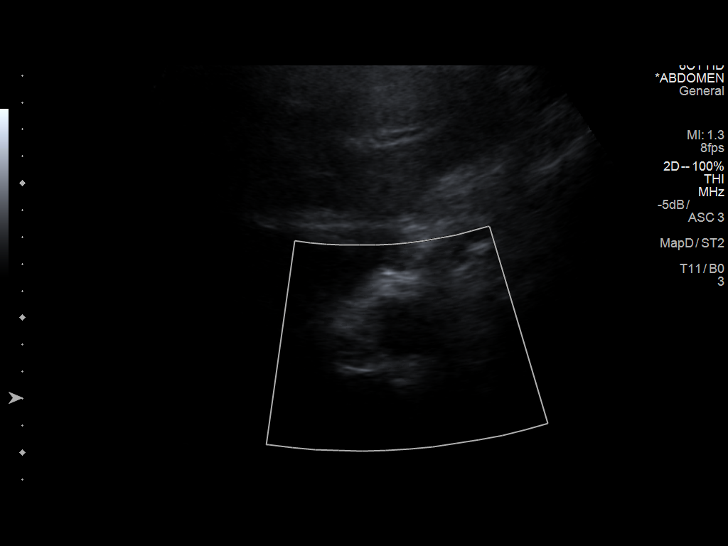
[im 10/37]
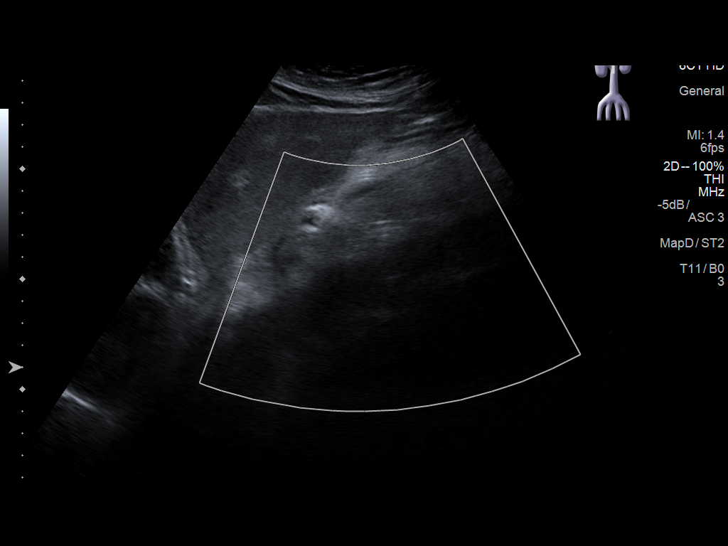
[im 13/37]
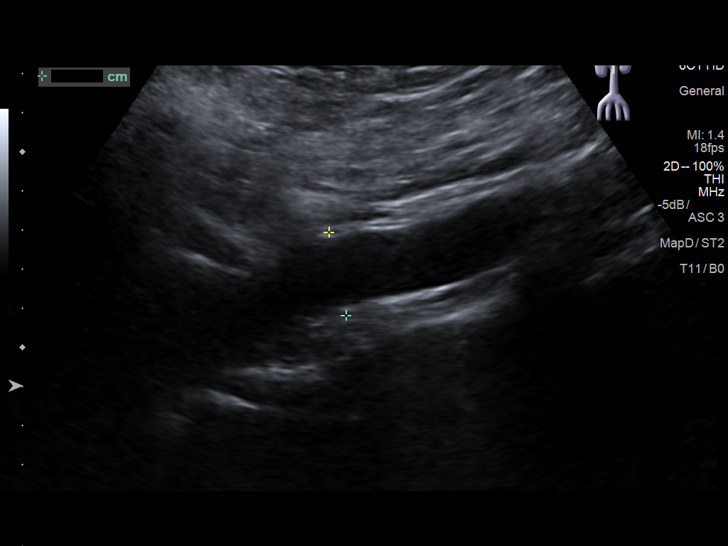
[im 14/37]
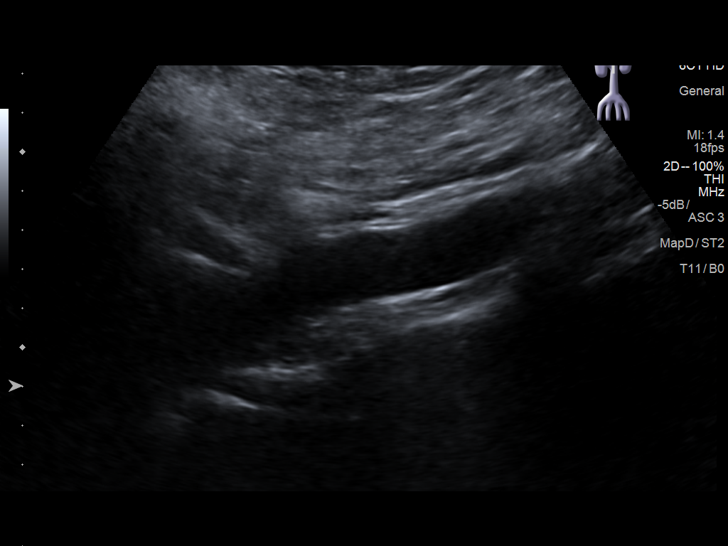
[im 17/37]
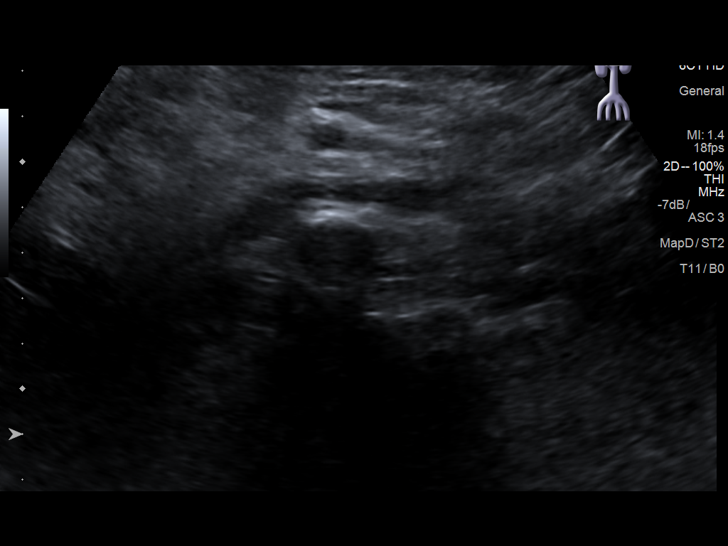
[im 20/37]
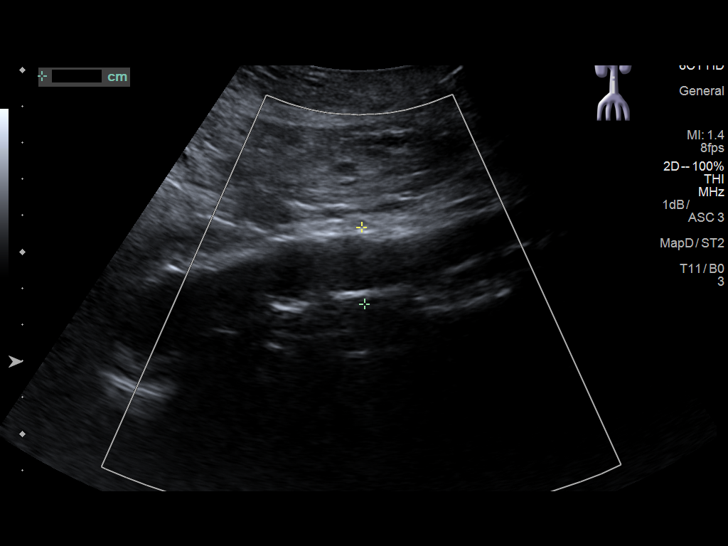
[im 23/37]
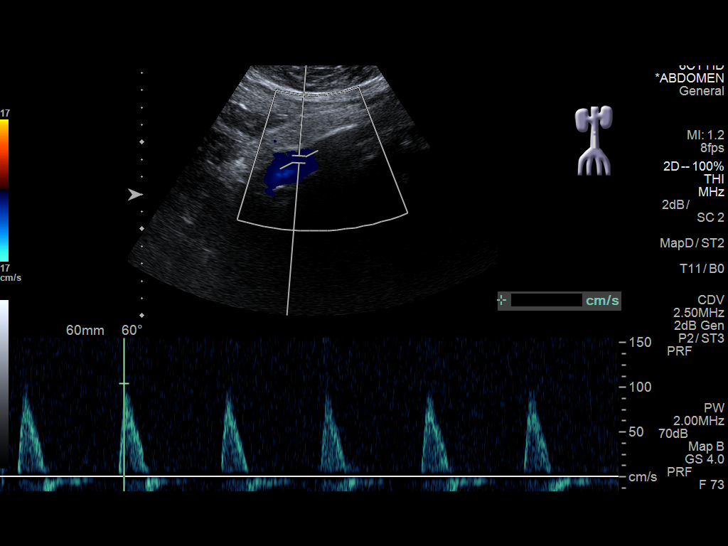
[im 25/37]
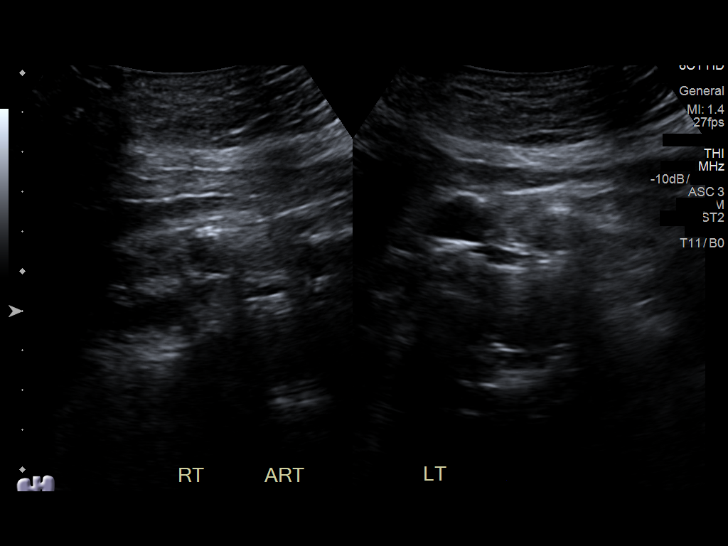
[im 28/37]
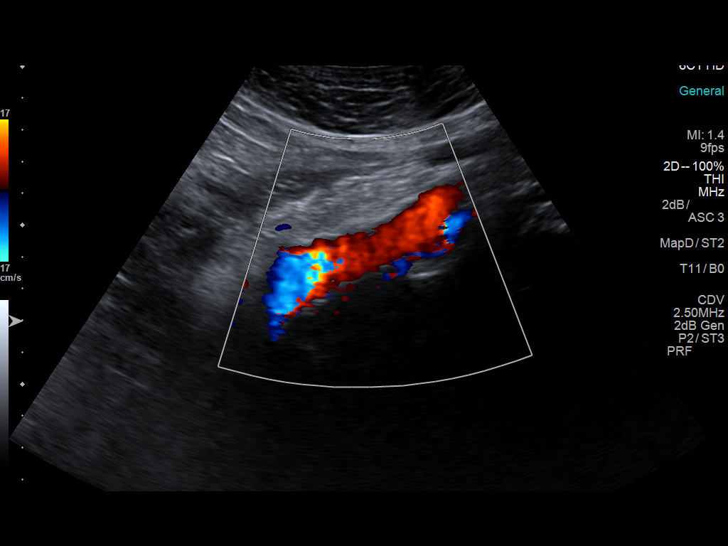
[im 31/37]
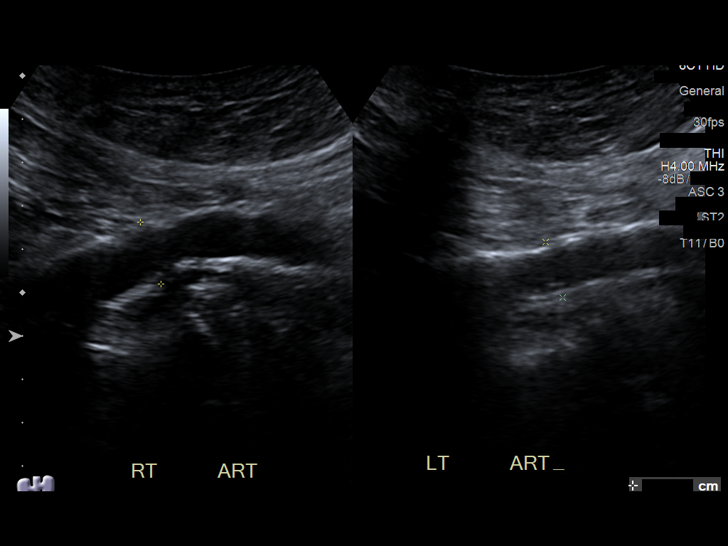
[im 34/37]
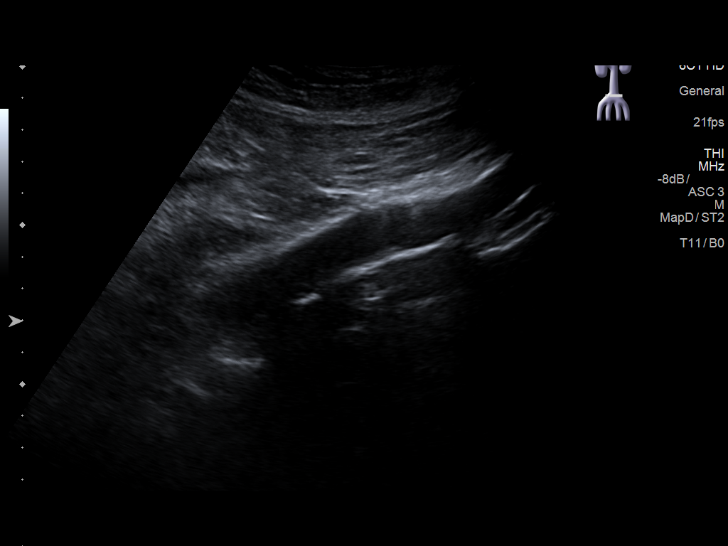
[im 37/37]
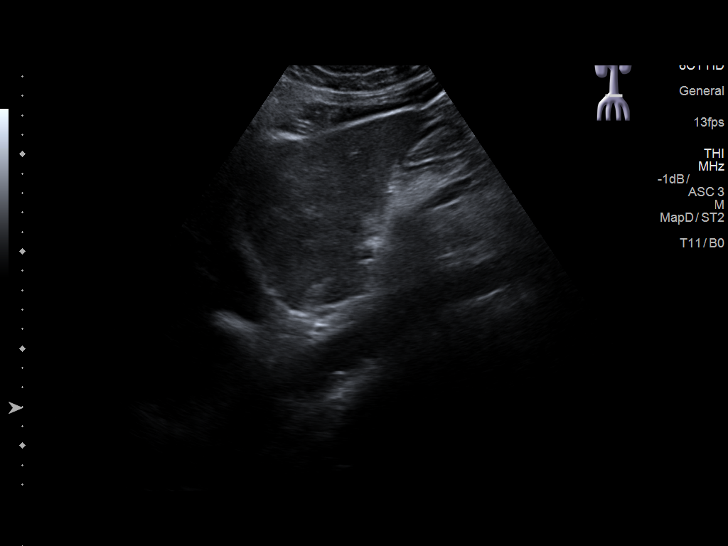

[14 of 25 positions shown; findings below may reference images not displayed]

FINDINGS: Abdominal aortic measurements as follows:

Proximal:  3 x 2.9 cm and AP by transverse dimension.

Mid:  1.9 x 2.3 cm cm

Distal:  1.9 x 1.9 cm cm
Patent: Yes, peak systolic velocity is 104 cm/s

Right common iliac artery: 1.3 x 1.4 cm

Left common iliac artery: 1.3 x 1.4 cm
IMPRESSION: 1. Atherosclerotic aneurysmal dilatation of the proximal aorta
measuring 3 x 2.9 cm. Recommend followup by ultrasound in 3 years.
This recommendation follows ACR consensus guidelines: White Paper of
the ACR Incidental Findings Committee II on Vascular Findings. [HOSPITAL] 2379; [DATE]. Aortic aneurysm NOS ([id number]-QVC.5)
2. Mild ectasia of the common iliac arteries bilaterally up to
cm.

## 2020-10-13 ENCOUNTER — Other Ambulatory Visit: Payer: Self-pay

## 2020-10-13 ENCOUNTER — Encounter: Payer: Self-pay | Admitting: Cardiology

## 2020-10-13 ENCOUNTER — Ambulatory Visit: Payer: Medicare HMO | Admitting: Cardiology

## 2020-10-13 VITALS — BP 112/64 | HR 78 | Ht 70.5 in | Wt 253.0 lb

## 2020-10-13 DIAGNOSIS — I950 Idiopathic hypotension: Secondary | ICD-10-CM | POA: Diagnosis not present

## 2020-10-13 DIAGNOSIS — I2510 Atherosclerotic heart disease of native coronary artery without angina pectoris: Secondary | ICD-10-CM | POA: Diagnosis not present

## 2020-10-13 DIAGNOSIS — E7800 Pure hypercholesterolemia, unspecified: Secondary | ICD-10-CM | POA: Diagnosis not present

## 2020-10-13 MED ORDER — METOPROLOL SUCCINATE ER 25 MG PO TB24: 25.0000 mg | ORAL_TABLET | Freq: Every day | ORAL | 3 refills | Status: DC

## 2020-10-13 MED ORDER — LOSARTAN POTASSIUM 50 MG PO TABS
50.0000 mg | ORAL_TABLET | Freq: Every day | ORAL | 3 refills | Status: DC
Start: 2020-10-13 — End: 2020-12-16

## 2020-10-13 MED ORDER — HYDROCHLOROTHIAZIDE 25 MG PO TABS
25.0000 mg | ORAL_TABLET | Freq: Every day | ORAL | 3 refills | Status: DC
Start: 2020-10-13 — End: 2021-05-28

## 2020-10-13 NOTE — Progress Notes (Signed)
Cardiology Office Note:    Date:  10/13/2020   ID:  John Wilson, DOB 12-11-1945, MRN [id number]  PCP:  Aurea Graff, PA-C (Inactive)  [facility] Cardiologist:  Candee Furbish, MD  [facility] Electrophysiologist:  None   Referring MD: No ref. provider found     History of Present Illness:    John Wilson is a 74 y.o. male here for the follow-up of coronary artery disease.  Cardiac catheterization 2003-RCA stent placed. Nuclear stress test demonstrated an inferior wall abnormality prior to cath.  Cardiac catheterization 03/2020 resulted in medical management.  Overall has been doing quite well but he still occasionally will have a low blood pressure that makes him feel washed out.  He brought his home blood pressure cuff with him, Omron and he does have an occasional BP in the upper 80s, 90s for instance.  We have made some adjustments to his medications as below.  No frank syncope.  Past Medical History:  Diagnosis Date  . CAD (coronary artery disease)   . Diabetes ([place])   . ED (erectile dysfunction)   . Gout   . Hyperlipidemia   . Hypertension   . Obesity   . Vertigo     Past Surgical History:  Procedure Laterality Date  . CHOLECYSTECTOMY  1981  . CORONARY ANGIOPLASTY WITH STENT PLACEMENT  2001   2 separate surgeries, 1 month apart  . HERNIA REPAIR  1941   Umbilical  . LEFT HEART CATH AND CORONARY ANGIOGRAPHY N/A 03/16/2020   Procedure: LEFT HEART CATH AND CORONARY ANGIOGRAPHY;  Surgeon: Troy Sine, MD;  Location: [facility];  Service: Cardiovascular;  Laterality: N/A;  . PILONIDAL CYST EXCISION  1979    Current Medications: Current Meds  Medication Sig  . aspirin EC 81 MG tablet Take 1 tablet (81 mg total) by mouth daily.  Marland Kitchen atorvastatin (LIPITOR) 80 MG tablet Take 1 tablet (80 mg total) by mouth daily.  . isosorbide mononitrate (IMDUR) 30 MG 24 hr tablet Take 1 tablet (30 mg total) by mouth daily.  . metFORMIN (GLUCOPHAGE-XR) 500 MG 24  hr tablet Take 500 mg by mouth daily with breakfast.   . MOBIC 15 MG tablet Take 15 mg by mouth daily as needed for pain.   . nitroGLYCERIN (NITROSTAT) 0.4 MG SL tablet Place 0.4 mg under the tongue every 5 (five) minutes as needed for chest pain.  . [DISCONTINUED] losartan-hydrochlorothiazide (HYZAAR) 100-25 MG tablet Take 1 tablet by mouth daily.  . [DISCONTINUED] metoprolol succinate (TOPROL-XL) 50 MG 24 hr tablet Take 25 mg by mouth daily. Take with or immediately following a meal.      Allergies:   Sulfa antibiotics   Social History   Socioeconomic History  . Marital status: Married    Spouse name: Not on file  . Number of children: Not on file  . Years of education: Not on file  . Highest education level: Not on file  Occupational History  . Not on file  Tobacco Use  . Smoking status: Former Smoker    Packs/day: 2.00    Years: 14.00    Pack years: 28.00    Types: Cigarettes    Quit date: 11/14/1982    Years since quitting: 37.9  . Smokeless tobacco: Never Used  . Tobacco comment: started age 53  Vaping Use  . Vaping Use: Never used  Substance and Sexual Activity  . Alcohol use: No  . Drug use: No  . Sexual activity: Not on  file  Other Topics Concern  . Not on file  Social History Narrative  . Not on file   Social Determinants of Health   Financial Resource Strain:   . Difficulty of Paying Living Expenses: Not on file  Food Insecurity:   . Worried About Charity fundraiser in the Last Year: Not on file  . Ran Out of Food in the Last Year: Not on file  Transportation Needs:   . Lack of Transportation (Medical): Not on file  . Lack of Transportation (Non-Medical): Not on file  Physical Activity:   . Days of Exercise per Week: Not on file  . Minutes of Exercise per Session: Not on file  Stress:   . Feeling of Stress : Not on file  Social Connections:   . Frequency of Communication with Friends and Family: Not on file  . Frequency of Social Gatherings with  Friends and Family: Not on file  . Attends Religious Services: Not on file  . Active Member of Clubs or Organizations: Not on file  . Attends Archivist Meetings: Not on file  . Marital Status: Not on file     Family History: The patient's family history includes Sick sinus syndrome in his mother.  ROS:   Please see the history of present illness.    No fevers chills nausea vomiting syncope bleeding all other systems reviewed and are negative.  EKGs/Labs/Other Studies Reviewed:    The following studies were reviewed today: Cath 03/2020: Diagnostic Dominance: Right    EKG: 03/13/2020-sinus bradycardia 52 no other abnormalities Recent Labs: 03/13/2020: BUN 24; Creatinine, Ser 1.17; Hemoglobin 15.1; Platelets 195; Sodium 141 03/16/2020: Potassium 3.9  Recent Lipid Panel No results found for: CHOL, TRIG, HDL, CHOLHDL, VLDL, LDLCALC, LDLDIRECT   Risk Assessment/Calculations:       Physical Exam:    VS:  BP 112/64   Pulse 78   Ht 5' 10.5" (1.791 m)   Wt 253 lb (114.8 kg)   SpO2 95%   BMI 35.79 kg/m     Wt Readings from Last 3 Encounters:  10/13/20 253 lb (114.8 kg)  04/23/20 255 lb (115.7 kg)  04/07/20 251 lb 12.8 oz (114.2 kg)     GEN:  Well nourished, well developed in no acute distress HEENT: Normal NECK: No JVD; No carotid bruits LYMPHATICS: No lymphadenopathy CARDIAC: RRR, no murmurs, rubs, gallops RESPIRATORY:  Clear to auscultation without rales, wheezing or rhonchi  ABDOMEN: Soft, non-tender, non-distended MUSCULOSKELETAL:  1+ LE edema; No deformity  SKIN: Warm and dry NEUROLOGIC:  Alert and oriented x 3 PSYCHIATRIC:  Normal affect   ASSESSMENT:    1. Coronary artery disease involving native coronary artery of native heart without angina pectoris   2. Idiopathic hypotension   3. Pure hypercholesterolemia    PLAN:    In order of problems listed above:  Coronary artery disease -See diagram above.  Cardiac catheterization reviewed.   Distal RCA 70% medical management.  Not having any anginal symptoms.  Hyperlipidemia -LDL goal less than 70.  Most recent LDL 91 from outside labs.  Continue with high intensity statin therapy.  No myalgias.Cut metoprolol to 25  Hypotension -At a prior visit we stopped the amlodipine 5 mg.  At home excellent blood pressure results, but occasionally they do drop down pretty significantly.  I am going to decrease his losartan to 50 and continue the hydrochlorothiazide at 25 mg a day to help with his edema lower extremity.  We will also continue  with his Toprol 25 mg a day.  Creatinine 1.3 hemoglobin 14.6 ALT 21  65-month follow-up blood pressure.  Shared Decision Making/Informed Consent        Medication Adjustments/Labs and Tests Ordered: Current medicines are reviewed at length with the patient today.  Concerns regarding medicines are outlined above.  No orders of the defined types were placed in this encounter.  No orders of the defined types were placed in this encounter.   There are no Patient Instructions on file for this visit.   Signed, Candee Furbish, MD  10/13/2020 3:05 PM    [facility]

## 2020-10-13 NOTE — Patient Instructions (Signed)
Medication Instructions:  Please discontinue your Metoprolol 50 mg tablets and start 25 mg tablet once daily. Take Losartan 50 mg a day and Hydrochlorothiazide 25 mg a day. (stop combination with these two medications together) Continue all other medications as listed.  *If you need a refill on your cardiac medications before your next appointment, please call your pharmacy*  Follow-Up: At [facility], you and your health needs are our priority.  As part of our continuing mission to provide you with exceptional heart care, we have created designated Provider Care Teams.  These Care Teams include your primary Cardiologist (physician) and Advanced Practice Providers (APPs -  Physician Assistants and Nurse Practitioners) who all work together to provide you with the care you need, when you need it.  We recommend signing up for the patient portal called "MyChart".  Sign up information is provided on this After Visit Summary.  MyChart is used to connect with patients for Virtual Visits (Telemedicine).  Patients are able to view lab/test results, encounter notes, upcoming appointments, etc.  Non-urgent messages can be sent to your provider as well.   To learn more about what you can do with MyChart, go to [email or web address].    Your next appointment:   6 month(s)  The format for your next appointment:   In Person  Provider:   Candee Furbish, MD   Thank you for choosing [facility]!!

## 2020-10-15 ENCOUNTER — Ambulatory Visit: Payer: Medicare HMO | Admitting: Cardiology

## 2020-12-14 DIAGNOSIS — E119 Type 2 diabetes mellitus without complications: Secondary | ICD-10-CM | POA: Diagnosis not present

## 2020-12-14 DIAGNOSIS — E782 Mixed hyperlipidemia: Secondary | ICD-10-CM | POA: Diagnosis not present

## 2020-12-14 DIAGNOSIS — E1159 Type 2 diabetes mellitus with other circulatory complications: Secondary | ICD-10-CM | POA: Diagnosis not present

## 2020-12-14 DIAGNOSIS — M25562 Pain in left knee: Secondary | ICD-10-CM | POA: Insufficient documentation

## 2020-12-14 DIAGNOSIS — I2510 Atherosclerotic heart disease of native coronary artery without angina pectoris: Secondary | ICD-10-CM | POA: Diagnosis not present

## 2020-12-14 DIAGNOSIS — I10 Essential (primary) hypertension: Secondary | ICD-10-CM | POA: Diagnosis not present

## 2020-12-15 DIAGNOSIS — M25562 Pain in left knee: Secondary | ICD-10-CM | POA: Diagnosis not present

## 2020-12-16 ENCOUNTER — Telehealth: Payer: Self-pay | Admitting: Cardiology

## 2020-12-16 MED ORDER — LOSARTAN POTASSIUM 100 MG PO TABS
100.0000 mg | ORAL_TABLET | Freq: Every day | ORAL | 3 refills | Status: DC
Start: 2020-12-16 — End: 2021-05-28

## 2020-12-16 NOTE — Telephone Encounter (Signed)
Lets go ahead and bring his losartan back up to 100 mg a day.  Keep the metoprolol at 25 for now.  Metoprolol affects the heart rate. Candee Furbish, MD

## 2020-12-16 NOTE — Telephone Encounter (Signed)
During office visit 10/13/20 Losartan was decreased from 100 mg to 50 mg daily, Metoprolol Succinate was decrease from 50 mg to 25 mg.  This change was made because pt was becoming hypotensive in the afternoons. He takes all BP medications every morning - usually around 6 am.  BP has been elevated in the AM (155-190/90s) and takes usually into the afternoon before it comes down.  HR has been in the 50s per pt's report. Advised pt I will review this information with Dr Marlou Porch and call back with further instructions.

## 2020-12-16 NOTE — Telephone Encounter (Signed)
    Pt c/o BP issue: STAT if pt c/o blurred vision, one-sided weakness or slurred speech  1. What are your last 5 BP readings? 188/95  2. Are you having any other symptoms (ex. Dizziness, headache, blurred vision, passed out)?   3. What is your BP issue? Pt said his BP been up every morning. He is concerned about it and would like to see Dr. Marlou Porch sooner to address it

## 2020-12-16 NOTE — Telephone Encounter (Signed)
Pt called to discuss his concerns with his elevated B/P, pt stated he took his prescribed medications at 6am today, and checked his b/p at 7am and it was 188/91; pt stated he was feeling flushed, but no other symptoms like headache, dizziness, or swelling.   Pt stated he had medication changes in November OV with Dr. Luther Parody, his B/P leveled; then noticed his b/p starting to elevate about a month later; but thought it was d/t the holidays.  Pt continued to have elevatged b/p.  Pt stated that his diet or weight has not changed since his medication change per office visit in 11/21.   Current B/p while on the phone was 185/91.

## 2020-12-16 NOTE — Telephone Encounter (Signed)
Spoke with patient and advised Dr Marlou Porch order him to increase his Losartan to 100 mg a day.  Pt states understanding.  RX sent into [facility] as requested.  Pt will continue to monitor his HR and BP.  He is aware this change may take several days before he notices a decrease in BP.  He will notify us if no improvement or if any further concerns.  Pt was appreciative of the call and information.

## 2020-12-22 ENCOUNTER — Ambulatory Visit: Payer: Medicare HMO | Admitting: Podiatry

## 2020-12-31 ENCOUNTER — Telehealth: Payer: Self-pay | Admitting: *Deleted

## 2020-12-31 ENCOUNTER — Ambulatory Visit: Payer: Medicare HMO | Admitting: Podiatry

## 2020-12-31 ENCOUNTER — Other Ambulatory Visit: Payer: Self-pay

## 2020-12-31 ENCOUNTER — Ambulatory Visit (INDEPENDENT_AMBULATORY_CARE_PROVIDER_SITE_OTHER): Payer: Medicare HMO

## 2020-12-31 ENCOUNTER — Encounter: Payer: Self-pay | Admitting: Podiatry

## 2020-12-31 DIAGNOSIS — E1159 Type 2 diabetes mellitus with other circulatory complications: Secondary | ICD-10-CM | POA: Insufficient documentation

## 2020-12-31 DIAGNOSIS — M1A0710 Idiopathic chronic gout, right ankle and foot, without tophus (tophi): Secondary | ICD-10-CM

## 2020-12-31 DIAGNOSIS — M19071 Primary osteoarthritis, right ankle and foot: Secondary | ICD-10-CM | POA: Diagnosis not present

## 2020-12-31 DIAGNOSIS — I77811 Abdominal aortic ectasia: Secondary | ICD-10-CM | POA: Insufficient documentation

## 2020-12-31 DIAGNOSIS — M7751 Other enthesopathy of right foot: Secondary | ICD-10-CM

## 2020-12-31 DIAGNOSIS — Z860100 Personal history of colon polyps, unspecified: Secondary | ICD-10-CM | POA: Insufficient documentation

## 2020-12-31 DIAGNOSIS — E782 Mixed hyperlipidemia: Secondary | ICD-10-CM | POA: Insufficient documentation

## 2020-12-31 DIAGNOSIS — M1000 Idiopathic gout, unspecified site: Secondary | ICD-10-CM | POA: Insufficient documentation

## 2020-12-31 DIAGNOSIS — K5900 Constipation, unspecified: Secondary | ICD-10-CM | POA: Insufficient documentation

## 2020-12-31 DIAGNOSIS — G63 Polyneuropathy in diseases classified elsewhere: Secondary | ICD-10-CM | POA: Insufficient documentation

## 2020-12-31 MED ORDER — INDOMETHACIN 50 MG PO CAPS: 50.0000 mg | ORAL_CAPSULE | Freq: Two times a day (BID) | ORAL | 3 refills | Status: DC

## 2020-12-31 NOTE — Telephone Encounter (Signed)
   [facility] Pre-operative Risk Assessment    HEARTCARE STAFF: - Please ensure there is not already an duplicate clearance open for this procedure. - Under Visit Info/Reason for Call, type in Other and utilize the format Clearance MM/DD/YY or Clearance TBD. Do not use dashes or single digits. - If request is for dental extraction, please clarify the # of teeth to be extracted.  Request for surgical clearance:  1. What type of surgery is being performed? LEFT TOTAL KNEE ARTHOPLASTY    2. When is this surgery scheduled? 03/08/21   3. What type of clearance is required (medical clearance vs. Pharmacy clearance to hold med vs. Both)? MEDICAL  4. Are there any medications that need to be held prior to surgery and how long? ASA    5. Practice name and name of physician performing surgery? [facility]; DR. FRANK ALUISIO   6. What is the office phone number? [phone]   7.   What is the office fax number? [phone]  8.   Anesthesia type (None, local, MAC, general) ? CHOICE   Julaine Hua 12/31/2020, 3:24 PM  _________________________________________________________________   (provider comments below)

## 2021-01-01 NOTE — Telephone Encounter (Signed)
   Primary Cardiologist: Candee Furbish, MD  Chart reviewed as part of pre-operative protocol coverage.Mr.  John Wilson has a hx of CAD s/p stent to RCA in 2003. Repeat catheterization 03/2020 showed single vessel coronary disease with distal RCA 70% stenosed, previous RCA stent patent, previous 1st marginal stent patent. LVEF 55-65%.   Last seen by Dr. Marlou Porch 10/13/20 doing well from cardiac perspective, due to occasional hypotension his antihypertensive regimen was reduced. He was recommended for follow up in 6 months which is scheduled for 04/13/21.  Will route to Dr. Marlou Porch for recommendations regarding holding aspirin prior to left TKA scheduled 03/08/21.   Loel Dubonnet, NP 01/01/2021, 2:32 PM

## 2021-01-03 NOTE — Progress Notes (Signed)
He presents today with a chief complaint of lateral foot and ankle pain across the dorsal lateral aspect of the forefoot right. States that he has had episodes of redness swelling severe pain over the last 2 years blood work for gout vascular testing and other doctors offices and visits were all negative. Orthopedic doctor says that it was just arthritis flareup. States that he has a flareup every other month and would like to get it under control.  Objective: Vital signs stable alert and oriented x3. Pulses are palpable. Neurologic sensorium is intact. Mild edema about the foot. No other erythema cellulitis drainage or odor. Pain on palpation along the lateral ankle and the dorsal aspect of the forefoot right.  Radiographs demonstrate osseously mature individual with normal ankle mortise. Does have an old fracture fragment of the base of the proximal phalanx second right. Significant osteoarthritic changes across the midfoot proximal and plantar distally oriented calcaneal heel spurs as well as spurring about the fifth metatarsal base.  Assessment: Osteoarthritis of the midfoot cannot rule out gout particularly with a history of hydrochlorothiazide use.  Plan: I started him on indomethacin 50 mg 1 p.o. twice daily #60 with 3 refills. Also instructed him to call this office should he develop another flare. At that point I would like to request a arthritic panel. He is to receive this arthritic panel that very day where there is a doctor in the office or not. Follow-up with him upon his request.

## 2021-01-04 ENCOUNTER — Telehealth: Payer: Self-pay | Admitting: Podiatry

## 2021-01-04 NOTE — Telephone Encounter (Signed)
Patient calling to inform Dr. Milinda Pointer that indomethacin (INDOCIN) 50 MG capsule was not available for pick up. Patient stated he called the pharmacy and Rx was not there. Please resend prescription.

## 2021-01-04 NOTE — Telephone Encounter (Signed)
LVM for pt to call the nurse triage to let me know which pharmacy would be best to send the medication too.

## 2021-01-04 NOTE — Telephone Encounter (Signed)
It went to the pharm he gave [place].  [facility] in [place]

## 2021-01-05 NOTE — Telephone Encounter (Signed)
   Primary Cardiologist: Candee Furbish, MD  Chart reviewed as part of pre-operative protocol coverage. Patient was contacted 01/05/2021 in reference to pre-operative risk assessment for pending surgery as outlined below.  John Wilson was last seen on 10/13/20 by Dr. Marlou Porch.  Since that day, John Wilson has done well. He reports no anginal symptoms and has an exercise tolerance of >4 METS.  Does note BP has been elevated with average reading 144/80. Separate message sent to his cardiologist. No indication to delay surgery due to hypertension slightly above goal but will follow up with patient.   Therefore, based on ACC/AHA guidelines, the patient would be at acceptable risk for the planned procedure without further cardiovascular testing.   Per Dr. Marlou Porch, he may hold Aspirin 5 days prior to the planned procedure.   The patient was advised that if he develops new symptoms prior to surgery to contact our office to arrange for a follow-up visit, and he verbalized understanding.  I will route this recommendation to the requesting party via Epic fax function and remove from pre-op pool. Please call with questions.  Loel Dubonnet, NP 01/05/2021, 11:13 AM

## 2021-01-05 NOTE — Telephone Encounter (Signed)
He may proceed with knee replacement with low to moderate overall cardiac risk given his prior coronary artery disease.  He may hold his aspirin for 5 days prior to procedure.  Please resume following procedure. Candee Furbish, MD

## 2021-01-05 NOTE — Telephone Encounter (Addendum)
Patient was contacted today in reference to preoperative clearance (see separate telephone encounter 12/31/20). During that phone call he relayed updated regarding blood pressure which remains elevated despite increased dose of Losartan. Readings below.   01/03/21 - 152/87 in the morning 01/02/21 -  147/79 in the morning  134/64 in the afternoon.  12/31/20 - 160/90 in the morning 141/86 in the evening 12/30/20 - 156/89 in the morning. 142/77 in the afternoon. 12/29/20 -  130/77 in the morning. 136/75 in the afternoon.  All readings are at least 1-2 hours after medications and checked with Omron arm cuff. Heart rate routinely 49-59 bpm. He reports no lightheadedness nor dizziness. No chest pain, dyspnea. No recent hypotensive readings.   Reviewed antihypertensive regimen which includes HCTZ 25mg  QD, Imdur 30mg  QD, Losartan 100mg  QD, Toprol 25mg  QD.   Will route to Dr. Marlou Porch and his RN for their review and any additional recommendations. Could consider increased dose of Imdur. Mr. Melroy is leaving tomorrow morning for [place] as his mother-in-law is under hospice care and he anticipates being out of town 2 weeks. Has upcoming follow up with his PCP 01/20/21. I did instruct him to take his entire pill bottle with him in case medication changes are needed.   Loel Dubonnet, NP

## 2021-01-06 NOTE — Telephone Encounter (Signed)
Left message on pt's VM to c/b to discuss adding Amlodipine as ordered.  Of note - prior documentation states pt will be out of town for a few weeks in [place].  Requested he call back with information regarding a pharmacy close by to him to sent the RX.

## 2021-01-06 NOTE — Telephone Encounter (Signed)
Let's add back amlodipine at lower dose, 2.5mg  PO QD.  Had hypotension with higher dose  Candee Furbish, MD

## 2021-01-13 NOTE — Telephone Encounter (Signed)
Pt has never returned the call.  Amlodipine has not been added back to his daily medications.

## 2021-01-15 ENCOUNTER — Telehealth: Payer: Self-pay | Admitting: *Deleted

## 2021-01-15 MED ORDER — AMLODIPINE BESYLATE 2.5 MG PO TABS: 2.5000 mg | ORAL_TABLET | Freq: Every day | ORAL | 3 refills | Status: DC

## 2021-01-15 NOTE — Telephone Encounter (Signed)
Let's add back amlodipine at lower dose, 2.5mg  PO QD.  Had hypotension with higher dose  Candee Furbish, MD   Pt called back today - he has been out of town for several weeks d/t a death in the family.  His BP remains elevated per his report.  Pt was advised to start Amlodipine 2.5 mg once daily and to continue monitoring his blood pressures.  He will notify the office if it continue to be elevated.   Rx sent into [facility] as requested

## 2021-01-15 NOTE — Telephone Encounter (Signed)
Patient returning call.

## 2021-01-20 DIAGNOSIS — E782 Mixed hyperlipidemia: Secondary | ICD-10-CM | POA: Diagnosis not present

## 2021-01-20 DIAGNOSIS — M170 Bilateral primary osteoarthritis of knee: Secondary | ICD-10-CM | POA: Diagnosis not present

## 2021-01-20 DIAGNOSIS — I77811 Abdominal aortic ectasia: Secondary | ICD-10-CM | POA: Diagnosis not present

## 2021-01-20 DIAGNOSIS — B351 Tinea unguium: Secondary | ICD-10-CM | POA: Diagnosis not present

## 2021-01-20 DIAGNOSIS — Z0000 Encounter for general adult medical examination without abnormal findings: Secondary | ICD-10-CM | POA: Diagnosis not present

## 2021-01-20 DIAGNOSIS — Z125 Encounter for screening for malignant neoplasm of prostate: Secondary | ICD-10-CM | POA: Diagnosis not present

## 2021-01-20 DIAGNOSIS — Z86010 Personal history of colonic polyps: Secondary | ICD-10-CM | POA: Diagnosis not present

## 2021-01-20 DIAGNOSIS — I2510 Atherosclerotic heart disease of native coronary artery without angina pectoris: Secondary | ICD-10-CM | POA: Diagnosis not present

## 2021-01-20 DIAGNOSIS — I10 Essential (primary) hypertension: Secondary | ICD-10-CM | POA: Diagnosis not present

## 2021-01-20 DIAGNOSIS — E119 Type 2 diabetes mellitus without complications: Secondary | ICD-10-CM | POA: Diagnosis not present

## 2021-02-05 DIAGNOSIS — M1712 Unilateral primary osteoarthritis, left knee: Secondary | ICD-10-CM | POA: Diagnosis not present

## 2021-02-10 DIAGNOSIS — E1159 Type 2 diabetes mellitus with other circulatory complications: Secondary | ICD-10-CM | POA: Diagnosis not present

## 2021-02-10 DIAGNOSIS — E119 Type 2 diabetes mellitus without complications: Secondary | ICD-10-CM | POA: Diagnosis not present

## 2021-02-10 DIAGNOSIS — I10 Essential (primary) hypertension: Secondary | ICD-10-CM | POA: Diagnosis not present

## 2021-02-10 DIAGNOSIS — I2510 Atherosclerotic heart disease of native coronary artery without angina pectoris: Secondary | ICD-10-CM | POA: Diagnosis not present

## 2021-02-10 DIAGNOSIS — M170 Bilateral primary osteoarthritis of knee: Secondary | ICD-10-CM | POA: Diagnosis not present

## 2021-02-10 DIAGNOSIS — E782 Mixed hyperlipidemia: Secondary | ICD-10-CM | POA: Diagnosis not present

## 2021-02-22 NOTE — Patient Instructions (Addendum)
DUE TO COVID-19 ONLY ONE VISITOR IS ALLOWED TO COME WITH YOU AND STAY IN THE WAITING ROOM ONLY DURING PRE OP AND PROCEDURE DAY OF SURGERY. THE 1 VISITOR  MAY VISIT WITH YOU AFTER SURGERY IN YOUR PRIVATE ROOM DURING VISITING HOURS ONLY!  YOU NEED TO HAVE A COVID 19 TEST ON: 03/04/21 @ 8:00 AM , THIS TEST MUST BE DONE BEFORE SURGERY,  COVID TESTING SITE [facility] [address], IT IS ON THE RIGHT GOING OUT [address] APPROXIMATELY  2 MINUTES PAST [employer] ON THE RIGHT. ONCE YOUR COVID TEST IS COMPLETED,  PLEASE BEGIN THE QUARANTINE INSTRUCTIONS AS OUTLINED IN YOUR HANDOUT.                Leonel Mccollum    Your procedure is scheduled on: 03/08/21   Report to [facility] Main  Entrance   Report to short stay at: 5:45 AM     Call this number if you have problems the morning of surgery [phone]    Remember:  NO SOLID FOOD AFTER MIDNIGHT THE NIGHT PRIOR TO SURGERY. NOTHING BY MOUTH EXCEPT CLEAR LIQUIDS UNTIL: 5:20 AM . PLEASE FINISH ENSURE DRINK PER SURGEON ORDER  WHICH NEEDS TO BE COMPLETED AT: 5:20 AM .  CLEAR LIQUID DIET  Foods Allowed                                                                     Foods Excluded  Coffee and tea, regular and decaf                             liquids that you cannot  Plain Jell-O any favor except red or purple                                           see through such as: Fruit ices (not with fruit pulp)                                     milk, soups, orange juice  Iced Popsicles                                    All solid food Carbonated beverages, regular and diet                                    Cranberry, grape and apple juices Sports drinks like Gatorade Lightly seasoned clear broth or consume(fat free) Sugar, honey syrup  Sample Menu Breakfast                                Lunch  Supper Cranberry juice                    Beef broth                             Chicken broth Jell-O                                     Grape juice                           Apple juice Coffee or tea                        Jell-O                                      Popsicle                                                Coffee or tea                        Coffee or tea  _____________________________________________________________________  BRUSH YOUR TEETH MORNING OF SURGERY AND RINSE YOUR MOUTH OUT, NO CHEWING GUM CANDY OR MINTS.    Take these medicines the morning of surgery with A SIP OF WATER:amlodipine,isosorbide,metoprolol.   How to Manage Your Diabetes Before and After Surgery  Why is it important to control my blood sugar before and after surgery? . Improving blood sugar levels before and after surgery helps healing and can limit problems. . A way of improving blood sugar control is eating a healthy diet by: o  Eating less sugar and carbohydrates o  Increasing activity/exercise o  Talking with your doctor about reaching your blood sugar goals . High blood sugars (greater than 180 mg/dL) can raise your risk of infections and slow your recovery, so you will need to focus on controlling your diabetes during the weeks before surgery. . Make sure that the doctor who takes care of your diabetes knows about your planned surgery including the date and location.  How do I manage my blood sugar before surgery? . Check your blood sugar at least 4 times a day, starting 2 days before surgery, to make sure that the level is not too high or low. o Check your blood sugar the morning of your surgery when you wake up and every 2 hours until you get to the Short Stay unit. . If your blood sugar is less than 70 mg/dL, you will need to treat for low blood sugar: o Do not take insulin. o Treat a low blood sugar (less than 70 mg/dL) with  cup of clear juice (cranberry or apple), 4 glucose tablets, OR glucose gel. o Recheck blood sugar in 15 minutes after treatment (to make  sure it is greater than 70 mg/dL). If your blood sugar is not greater than 70 mg/dL on recheck, call [phone] for further instructions. . Report your blood sugar to the short stay nurse when you get to Short Stay.  . If you are admitted  to the hospital after surgery: o Your blood sugar will be checked by the staff and you will probably be given insulin after surgery (instead of oral diabetes medicines) to make sure you have good blood sugar levels. o The goal for blood sugar control after surgery is 80-180 mg/dL.   WHAT DO I DO ABOUT MY DIABETES MEDICATION?  Marland Kitchen Do not take oral diabetes medicines (pills) the morning of surgery.  . THE DAY BEFORE SURGERY, take Metformin as usual.      THE MORNING OF SURGERY, DO NOT TAKE ANY DIABETIC MEDICATIONS DAY OF YOUR SURGERY                               You may not have any metal on your body including hair pins and              piercings  Do not wear jewelry,lotions, powders or perfumes, deodorant             Men may shave face and neck.   Do not bring valuables to the hospital. [place].  Contacts, dentures or bridgework may not be worn into surgery.  Leave suitcase in the car. After surgery it may be brought to your room.     Patients discharged the day of surgery will not be allowed to drive home. IF YOU ARE HAVING SURGERY AND GOING HOME THE SAME DAY, YOU MUST HAVE AN ADULT TO DRIVE YOU HOME AND BE WITH YOU FOR 24 HOURS. YOU MAY GO HOME BY TAXI OR UBER OR ORTHERWISE, BUT AN ADULT MUST ACCOMPANY YOU HOME AND STAY WITH YOU FOR 24 HOURS.  Name and phone number of your driver:  Special Instructions: N/A              Please read over the following fact sheets you were given: _____________________________________________________________________         [facility] - Preparing for Surgery Before surgery, you can play an important role.  Because skin is not sterile, your skin needs to be as  free of germs as possible.  You can reduce the number of germs on your skin by washing with CHG (chlorahexidine gluconate) soap before surgery.  CHG is an antiseptic cleaner which kills germs and bonds with the skin to continue killing germs even after washing. Please DO NOT use if you have an allergy to CHG or antibacterial soaps.  If your skin becomes reddened/irritated stop using the CHG and inform your nurse when you arrive at Short Stay. Do not shave (including legs and underarms) for at least 48 hours prior to the first CHG shower.  You may shave your face/neck. Please follow these instructions carefully:  1.  Shower with CHG Soap the night before surgery and the  morning of Surgery.  2.  If you choose to wash your hair, wash your hair first as usual with your  normal  shampoo.  3.  After you shampoo, rinse your hair and body thoroughly to remove the  shampoo.                           4.  Use CHG as you would any other liquid soap.  You can apply chg directly  to the skin and wash  Gently with a scrungie or clean washcloth.  5.  Apply the CHG Soap to your body ONLY FROM THE NECK DOWN.   Do not use on face/ open                           Wound or open sores. Avoid contact with eyes, ears mouth and genitals (private parts).                       Wash face,  Genitals (private parts) with your normal soap.             6.  Wash thoroughly, paying special attention to the area where your surgery  will be performed.  7.  Thoroughly rinse your body with warm water from the neck down.  8.  DO NOT shower/wash with your normal soap after using and rinsing off  the CHG Soap.                9.  Pat yourself dry with a clean towel.            10.  Wear clean pajamas.            11.  Place clean sheets on your bed the night of your first shower and do not  sleep with pets. Day of Surgery : Do not apply any lotions/deodorants the morning of surgery.  Please wear clean clothes to the  hospital/surgery center.  FAILURE TO FOLLOW THESE INSTRUCTIONS MAY RESULT IN THE CANCELLATION OF YOUR SURGERY PATIENT SIGNATURE_________________________________  NURSE SIGNATURE__________________________________  ________________________________________________________________________   Adam Phenix  An incentive spirometer is a tool that can help keep your lungs clear and active. This tool measures how well you are filling your lungs with each breath. Taking long deep breaths may help reverse or decrease the chance of developing breathing (pulmonary) problems (especially infection) following:  A long period of time when you are unable to move or be active. BEFORE THE PROCEDURE   If the spirometer includes an indicator to show your best effort, your nurse or respiratory therapist will set it to a desired goal.  If possible, sit up straight or lean slightly forward. Try not to slouch.  Hold the incentive spirometer in an upright position. INSTRUCTIONS FOR USE  1. Sit on the edge of your bed if possible, or sit up as far as you can in bed or on a chair. 2. Hold the incentive spirometer in an upright position. 3. Breathe out normally. 4. Place the mouthpiece in your mouth and seal your lips tightly around it. 5. Breathe in slowly and as deeply as possible, raising the piston or the ball toward the top of the column. 6. Hold your breath for 3-5 seconds or for as long as possible. Allow the piston or ball to fall to the bottom of the column. 7. Remove the mouthpiece from your mouth and breathe out normally. 8. Rest for a few seconds and repeat Steps 1 through 7 at least 10 times every 1-2 hours when you are awake. Take your time and take a few normal breaths between deep breaths. 9. The spirometer may include an indicator to show your best effort. Use the indicator as a goal to work toward during each repetition. 10. After each set of 10 deep breaths, practice coughing to be sure  your lungs are clear. If you have an incision (the cut made at the time of  surgery), support your incision when coughing by placing a pillow or rolled up towels firmly against it. Once you are able to get out of bed, walk around indoors and cough well. You may stop using the incentive spirometer when instructed by your caregiver.  RISKS AND COMPLICATIONS  Take your time so you do not get dizzy or light-headed.  If you are in pain, you may need to take or ask for pain medication before doing incentive spirometry. It is harder to take a deep breath if you are having pain. AFTER USE  Rest and breathe slowly and easily.  It can be helpful to keep track of a log of your progress. Your caregiver can provide you with a simple table to help with this. If you are using the spirometer at home, follow these instructions: [place] IF:   You are having difficultly using the spirometer.  You have trouble using the spirometer as often as instructed.  Your pain medication is not giving enough relief while using the spirometer.  You develop fever of 100.5 F (38.1 C) or higher. SEEK IMMEDIATE MEDICAL CARE IF:   You cough up bloody sputum that had not been present before.  You develop fever of 102 F (38.9 C) or greater.  You develop worsening pain at or near the incision site. MAKE SURE YOU:   Understand these instructions.  Will watch your condition.  Will get help right away if you are not doing well or get worse. Document Released: 03/13/2007 Document Revised: 01/23/2012 Document Reviewed: 05/14/2007 [facility] Patient Information 2014 [place], [place].   ________________________________________________________________________

## 2021-02-24 ENCOUNTER — Other Ambulatory Visit: Payer: Self-pay

## 2021-02-24 ENCOUNTER — Encounter (HOSPITAL_COMMUNITY): Payer: Self-pay

## 2021-02-24 ENCOUNTER — Encounter (HOSPITAL_COMMUNITY)
Admission: RE | Admit: 2021-02-24 | Discharge: 2021-02-24 | Disposition: A | Discharge status: Home / Self Care | Payer: Medicare HMO | Source: Ambulatory Visit | Attending: Orthopedic Surgery | Admitting: Orthopedic Surgery

## 2021-02-24 DIAGNOSIS — Z01812 Encounter for preprocedural laboratory examination: Secondary | ICD-10-CM | POA: Insufficient documentation

## 2021-02-24 HISTORY — DX: Unspecified osteoarthritis, unspecified site: M19.90

## 2021-02-24 HISTORY — DX: Cardiac murmur, unspecified: R01.1

## 2021-02-24 LAB — COMPREHENSIVE METABOLIC PANEL
ALT: 28 U/L (ref 0–44)
AST: 29 U/L (ref 15–41)
Albumin: 4 g/dL (ref 3.5–5.0)
Alkaline Phosphatase: 62 U/L (ref 38–126)
Anion gap: 8 (ref 5–15)
BUN: 25 mg/dL — ABNORMAL HIGH (ref 8–23)
CO2: 29 mmol/L (ref 22–32)
Calcium: 8.7 mg/dL — ABNORMAL LOW (ref 8.9–10.3)
Chloride: 101 mmol/L (ref 98–111)
Creatinine, Ser: 1.2 mg/dL (ref 0.61–1.24)
GFR, Estimated: >60 mL/min (ref >60)
Glucose, Bld: 124 mg/dL — ABNORMAL HIGH (ref 70–99)
Potassium: 3.4 mmol/L — ABNORMAL LOW (ref 3.5–5.1)
Sodium: 138 mmol/L (ref 135–145)
Total Bilirubin: 1.1 mg/dL (ref 0.3–1.2)
Total Protein: 6.7 g/dL (ref 6.5–8.1)

## 2021-02-24 LAB — CBC
HCT: 45.1 % (ref 39.0–52.0)
Hemoglobin: 14.9 g/dL (ref 13.0–17.0)
MCH: 29.9 pg (ref 26.0–34.0)
MCHC: 33 g/dL (ref 30.0–36.0)
MCV: 90.6 fL (ref 80.0–100.0)
Platelets: 172 10*3/uL (ref 150–400)
RBC: 4.98 MIL/uL (ref 4.22–5.81)
RDW: 14.6 % (ref 11.5–15.5)
WBC: 7.8 10*3/uL (ref 4.0–10.5)
nRBC: 0 % (ref 0.0–0.2)

## 2021-02-24 LAB — TYPE AND SCREEN
ABO/RH(D): A NEG
Antibody Screen: NEGATIVE

## 2021-02-24 LAB — PROTIME-INR
INR: 1 (ref 0.8–1.2)
Prothrombin Time: 12.8 seconds (ref 11.4–15.2)

## 2021-02-24 LAB — HEMOGLOBIN A1C
Hgb A1c MFr Bld: 6.8 % — ABNORMAL HIGH (ref 4.8–5.6)
Mean Plasma Glucose: 148.46 mg/dL

## 2021-02-24 LAB — APTT: aPTT: 33 seconds (ref 24–36)

## 2021-02-24 LAB — SURGICAL PCR SCREEN
MRSA, PCR: NEGATIVE
Staphylococcus aureus: NEGATIVE

## 2021-02-24 LAB — GLUCOSE, CAPILLARY: Glucose-Capillary: 135 mg/dL — ABNORMAL HIGH (ref 70–99)

## 2021-02-24 NOTE — Progress Notes (Signed)
COVID Vaccine Completed: NO Date COVID Vaccine completed: COVID vaccine manufacturer: [employer]   PCP - Sammuel Hines Cardiologist - Dr. Candee Furbish. Clearance: Laurann Montana: NP: 01/05/21: Epic,Chart.  Chest x-ray -  EKG - 03/13/20 Stress Test -  ECHO -  Cardiac Cath - 03/16/20 Pacemaker/ICD device last checked:  Sleep Study -  CPAP -   Fasting Blood Sugar - N/A Checks Blood Sugar ___0__ times a day  Blood Thinner Instructions: Aspirin Instructions: Last Dose:  Anesthesia review: Hx: HTN,CAD,DIA,Heart murmur  Patient denies shortness of breath, fever, cough and chest pain at PAT appointment   Patient verbalized understanding of instructions that were given to them at the PAT appointment. Patient was also instructed that they will need to review over the PAT instructions again at home before surgery.

## 2021-02-25 NOTE — Progress Notes (Signed)
Anesthesia Chart Review   Case: [id number] Date/Time: 03/08/21 0805   Procedure: TOTAL KNEE ARTHROPLASTY (Left Knee) - 75min   Anesthesia type: Choice   Pre-op diagnosis: left knee osteoarthritis   Location: [facility] ROOM 10 / [facility]   Surgeons: Gaynelle Arabian, MD      DISCUSSION:74 y.o. former smoker with h/o HTN, diabetes, CAD (s/p stent to RCA in 2003. Repeat catheterization 03/2020 showed single vessel coronary disease with distal RCA 70% stenosed, previous RCA stent patent, previous 1st marginal stent patent. LVEF 55-65%), left knee OA scheduled for above procedure 03/08/2021 with Dr. Gaynelle Arabian.   Per cardiology preoperative evaluation 01/05/21, "Chart reviewed as part of pre-operative protocol coverage. Patient was contacted 01/05/2021 in reference to pre-operative risk assessment for pending surgery as outlined below.  John Wilson was last seen on 10/13/20 by Dr. Marlou Porch.  Since that day, John Wilson has done well. He reports no anginal symptoms and has an exercise tolerance of >4 METS.  Does note BP has been elevated with average reading 144/80. Separate message sent to his cardiologist. No indication to delay surgery due to hypertension slightly above goal but will follow up with patient.   Therefore, based on ACC/AHA guidelines, the patient would be at acceptable risk for the planned procedure without further cardiovascular testing."  Anticipate pt can proceed with planned procedure barring acute status change.   VS: BP (!) 172/75   Pulse (!) 53   Temp 37.2 C (Oral)   Ht 5\' 10"  (1.778 m)   Wt 114.3 kg   SpO2 99%   BMI 36.16 kg/m   PROVIDERS: Aura Dials, MD is PCP   Candee Furbish, MD is Cardiologist  LABS: Labs reviewed: Acceptable for surgery. (all labs ordered are listed, but only abnormal results are displayed)  Labs Reviewed  COMPREHENSIVE METABOLIC PANEL - Abnormal; Notable for the following components:      Result Value   Potassium 3.4 (*)    Glucose, Bld 124  (*)    BUN 25 (*)    Calcium 8.7 (*)    All other components within normal limits  HEMOGLOBIN A1C - Abnormal; Notable for the following components:   Hgb A1c MFr Bld 6.8 (*)    All other components within normal limits  GLUCOSE, CAPILLARY - Abnormal; Notable for the following components:   Glucose-Capillary 135 (*)    All other components within normal limits  SURGICAL PCR SCREEN  CBC  PROTIME-INR  APTT  TYPE AND SCREEN     IMAGES:   EKG: 03/13/2020 Rate 52 bpm  Sinus brady  CV:  Past Medical History:  Diagnosis Date  . Arthritis   . CAD (coronary artery disease)   . Diabetes ([place])   . ED (erectile dysfunction)   . Gout   . Heart murmur   . Hyperlipidemia   . Hypertension   . Obesity   . Vertigo     Past Surgical History:  Procedure Laterality Date  . CHOLECYSTECTOMY  1981  . CORONARY ANGIOPLASTY WITH STENT PLACEMENT  2001   2 separate surgeries, 1 month apart  . HERNIA REPAIR  [id number]   Umbilical  . LEFT HEART CATH AND CORONARY ANGIOGRAPHY N/A 03/16/2020   Procedure: LEFT HEART CATH AND CORONARY ANGIOGRAPHY;  Surgeon: Troy Sine, MD;  Location: [place] [facility];  Service: Cardiovascular;  Laterality: N/A;  . PILONIDAL CYST EXCISION  1979    MEDICATIONS: . amLODipine (NORVASC) 2.5 MG tablet  . aspirin EC 81 MG tablet  .  atorvastatin (LIPITOR) 80 MG tablet  . hydrochlorothiazide (HYDRODIURIL) 25 MG tablet  . indomethacin (INDOCIN) 50 MG capsule  . isosorbide mononitrate (IMDUR) 30 MG 24 hr tablet  . losartan (COZAAR) 100 MG tablet  . metFORMIN (GLUCOPHAGE-XR) 500 MG 24 hr tablet  . metoprolol succinate (TOPROL-XL) 50 MG 24 hr tablet  . nitroGLYCERIN (NITROSTAT) 0.4 MG SL tablet   No current facility-administered medications for this encounter.   Konrad Felix, PA-C [facility]-Surgical Testing [phone]

## 2021-02-25 NOTE — Anesthesia Preprocedure Evaluation (Addendum)
Anesthesia Evaluation  Patient identified by MRN, date of birth, ID band Patient awake    Reviewed: Allergy & Precautions, NPO status , Patient's Chart, lab work & pertinent test results  Airway Mallampati: II  TM Distance: >3 FB Neck ROM: Full    Dental no notable dental hx.    Pulmonary neg pulmonary ROS, former smoker,    Pulmonary exam normal breath sounds clear to auscultation       Cardiovascular hypertension, + CAD and + Cardiac Stents  Normal cardiovascular exam Rhythm:Regular Rate:Normal     Neuro/Psych negative neurological ROS  negative psych ROS   GI/Hepatic negative GI ROS, Neg liver ROS,   Endo/Other  diabetes  Renal/GU negative Renal ROS  negative genitourinary   Musculoskeletal negative musculoskeletal ROS (+)   Abdominal   Peds negative pediatric ROS (+)  Hematology negative hematology ROS (+)   Anesthesia Other Findings   Reproductive/Obstetrics negative OB ROS                            Anesthesia Physical Anesthesia Plan  ASA: III  Anesthesia Plan: Spinal   Post-op Pain Management:  Regional for Post-op pain   Induction: Intravenous  PONV Risk Score and Plan: 2 and Ondansetron, Dexamethasone and Treatment may vary due to age or medical condition  Airway Management Planned: Simple Face Mask  Additional Equipment:   Intra-op Plan:   Post-operative Plan:   Informed Consent: I have reviewed the patients History and Physical, chart, labs and discussed the procedure including the risks, benefits and alternatives for the proposed anesthesia with the patient or authorized representative who has indicated his/her understanding and acceptance.     Dental advisory given  Plan Discussed with: CRNA and Surgeon  Anesthesia Plan Comments: (See PAT note 02/24/2021, Konrad Felix, PA-C)       Anesthesia Quick Evaluation

## 2021-02-26 ENCOUNTER — Other Ambulatory Visit (HOSPITAL_COMMUNITY): Payer: Medicare HMO

## 2021-03-04 ENCOUNTER — Other Ambulatory Visit (HOSPITAL_COMMUNITY)
Admission: RE | Admit: 2021-03-04 | Discharge: 2021-03-04 | Disposition: A | Discharge status: Home / Self Care | Payer: Medicare HMO | Source: Ambulatory Visit | Attending: Orthopedic Surgery | Admitting: Orthopedic Surgery

## 2021-03-04 DIAGNOSIS — Z01812 Encounter for preprocedural laboratory examination: Secondary | ICD-10-CM | POA: Insufficient documentation

## 2021-03-04 DIAGNOSIS — Z20822 Contact with and (suspected) exposure to covid-19: Secondary | ICD-10-CM | POA: Diagnosis not present

## 2021-03-04 LAB — SARS CORONAVIRUS 2 (TAT 6-24 HRS): SARS Coronavirus 2: NEGATIVE

## 2021-03-07 MED ORDER — BUPIVACAINE LIPOSOME 1.3 % IJ SUSP
20.0000 mL | Freq: Once | INTRAMUSCULAR | Status: DC
  Filled 2021-03-07: qty 20

## 2021-03-07 NOTE — H&P (Signed)
TOTAL KNEE ADMISSION H&P  Patient is being admitted for left total knee arthroplasty.  Subjective:  Chief Complaint: Left knee pain.  HPI: John Wilson, 75 y.o. male has a history of pain and functional disability in the left knee due to arthritis and has failed non-surgical conservative treatments for greater than 12 weeks to include NSAID's and/or analgesics and activity modification. Onset of symptoms was gradual, starting several years ago with stable course since that time. The patient noted no past surgery on the left knee.  Patient currently rates pain in the left knee at 5 out of 10 with activity. Patient has worsening of pain with activity and weight bearing and pain that interferes with activities of daily living. Patient has evidence of periarticular osteophytes and joint space narrowing by imaging studies.  There is no active infection.  Patient Active Problem List   Diagnosis Date Noted  . Abdominal aortic ectasia ([place]) 12/31/2020  . Constipation 12/31/2020  . Idiopathic gout 12/31/2020  . Mixed hyperlipidemia 12/31/2020  . Morbid obesity ([place]) 12/31/2020  . Personal history of colonic polyps 12/31/2020  . Polyneuropathy in diseases classified elsewhere ([place]) 12/31/2020  . Type 2 diabetes mellitus with other circulatory complications ([place]) 09/38/1829  . Diabetes mellitus with coincident hypertension ([place]) 09/16/2019  . Coronary artery disease involving native coronary artery of native heart without angina pectoris 09/16/2019  . Pure hypercholesterolemia 09/16/2019  . Incarcerated umbilical hernia 93/71/6967    Past Medical History:  Diagnosis Date  . Arthritis   . CAD (coronary artery disease)   . Diabetes ([place])   . ED (erectile dysfunction)   . Gout   . Heart murmur   . Hyperlipidemia   . Hypertension   . Obesity   . Vertigo     Past Surgical History:  Procedure Laterality Date  . CHOLECYSTECTOMY  1981  . CORONARY ANGIOPLASTY WITH STENT PLACEMENT  2001   2  separate surgeries, 1 month apart  . HERNIA REPAIR  8938   Umbilical  . LEFT HEART CATH AND CORONARY ANGIOGRAPHY N/A 03/16/2020   Procedure: LEFT HEART CATH AND CORONARY ANGIOGRAPHY;  Surgeon: Troy Sine, MD;  Location: [facility];  Service: Cardiovascular;  Laterality: N/A;  . PILONIDAL CYST EXCISION  1979    Prior to Admission medications   Medication Sig Start Date End Date Taking? Authorizing Provider  amLODipine (NORVASC) 2.5 MG tablet Take 1 tablet (2.5 mg total) by mouth daily. 01/15/21  Yes Jerline Pain, MD  aspirin EC 81 MG tablet Take 1 tablet (81 mg total) by mouth daily. 03/13/20  Yes Jerline Pain, MD  atorvastatin (LIPITOR) 80 MG tablet Take 1 tablet (80 mg total) by mouth daily. 09/16/19  Yes Jerline Pain, MD  hydrochlorothiazide (HYDRODIURIL) 25 MG tablet Take 1 tablet (25 mg total) by mouth daily. 10/13/20  Yes Jerline Pain, MD  indomethacin (INDOCIN) 50 MG capsule Take 1 capsule (50 mg total) by mouth 2 (two) times daily with a meal. 12/31/20  Yes Hyatt, Max T, DPM  isosorbide mononitrate (IMDUR) 30 MG 24 hr tablet Take 1 tablet (30 mg total) by mouth daily. 03/18/20  Yes Jerline Pain, MD  losartan (COZAAR) 100 MG tablet Take 1 tablet (100 mg total) by mouth daily. 12/16/20  Yes Jerline Pain, MD  metFORMIN (GLUCOPHAGE-XR) 500 MG 24 hr tablet Take 1,000 mg by mouth daily with breakfast. 05/08/16  Yes [provider]  metoprolol succinate (TOPROL-XL) 50 MG 24 hr tablet Take 50  mg by mouth daily. Take with or immediately following a meal.   Yes [provider]  nitroGLYCERIN (NITROSTAT) 0.4 MG SL tablet Place 0.4 mg under the tongue every 5 (five) minutes as needed for chest pain.   Yes [provider]    Allergies  Allergen Reactions  . Sulfa Antibiotics Hives    Social History   Socioeconomic History  . Marital status: Married    Spouse name: Not on file  . Number of children: Not on file  . Years of education: Not on file  .  Highest education level: Not on file  Occupational History  . Not on file  Tobacco Use  . Smoking status: Former Smoker    Packs/day: 2.00    Years: 14.00    Pack years: 28.00    Types: Cigarettes    Quit date: 11/14/1982    Years since quitting: 38.3  . Smokeless tobacco: Never Used  . Tobacco comment: started age 38  Vaping Use  . Vaping Use: Never used  Substance and Sexual Activity  . Alcohol use: No  . Drug use: No  . Sexual activity: Not on file  Other Topics Concern  . Not on file  Social History Narrative  . Not on file   Social Determinants of Health   Financial Resource Strain: Not on file  Food Insecurity: Not on file  Transportation Needs: Not on file  Physical Activity: Not on file  Stress: Not on file  Social Connections: Not on file  Intimate Partner Violence: Not on file    Tobacco Use: Medium Risk  . Smoking Tobacco Use: Former Smoker  . Smokeless Tobacco Use: Never Used   Social History   Substance and Sexual Activity  Alcohol Use No    Family History  Problem Relation Age of Onset  . Sick sinus syndrome Mother        pacemaker    ROS: Constitutional: no fever, no chills, no night sweats, no significant weight loss Cardiovascular: no chest pain, no palpitations Respiratory: no cough, no shortness of breath, No COPD Gastrointestinal: no vomiting, no nausea Musculoskeletal: no swelling in Joints, Joint Pain Neurologic: no numbness, no tingling, no difficulty with balance   Objective:  Physical Exam: Well nourished and well developed.  General: Alert and oriented x3, cooperative and pleasant, no acute distress.  Head: normocephalic, atraumatic, neck supple.  Eyes: EOMI.  Respiratory: breath sounds clear in all fields, no wheezing, rales, or rhonchi. Cardiovascular: Regular rate and rhythm, no murmurs, gallops or rubs.  Abdomen: non-tender to palpation and soft, normoactive bowel sounds. Musculoskeletal:  His left knee shows 3-4  from full extension with further flexion to 95. Trace effusion. A varus deformity. Significant crepitation throughout range of motion of the left knee as well as the right knee. [place] instability to collateral or cruciate testing. Pain on McMurray's but no clunk.  Calves soft and nontender. Motor function intact in LE. Strength 5/5 LE bilaterally. Neuro: Distal pulses 2+. Sensation to light touch intact in LE.  Vital signs in last 24 hours:    Imaging Review AP and lateral of the bilateral knees dated 12/15/2020 demonstrate severe bone-on-bone arthritis medial and patellofemoral and near bone-on-bone lateral compartment on that left knee. The right knee is near bone-on-bone medial and lateral also. We do not have a lateral view on the right knee.   Assessment/Plan:  End stage arthritis, left knee   The patient history, physical examination, clinical judgment of the provider and imaging  studies are consistent with end stage degenerative joint disease of the left knee and total knee arthroplasty is deemed medically necessary. The treatment options including medical management, injection therapy arthroscopy and arthroplasty were discussed at length. The risks and benefits of total knee arthroplasty were presented and reviewed. The risks due to aseptic loosening, infection, stiffness, patella tracking problems, thromboembolic complications and other imponderables were discussed. The patient acknowledged the explanation, agreed to proceed with the plan and consent was signed. Patient is being admitted for inpatient treatment for surgery, pain control, PT, OT, prophylactic antibiotics, VTE prophylaxis, progressive ambulation and ADLs and discharge planning. The patient is planning to be discharged home.   Patient's anticipated LOS is less than 2 midnights, meeting these requirements: - Younger than 44 - Lives within 1 hour of care - Has a competent adult at home to recover with post-op recover - NO  history of  - Chronic pain requiring opiods  - Diabetes  - Coronary Artery Disease  - Heart failure  - Heart attack  - Stroke  - DVT/VTE  - Cardiac arrhythmia  - Respiratory Failure/COPD  - Renal failure  - Anemia  - Advanced Liver disease      Therapy Plans: John Wilson Disposition: Home with Wife Planned DVT Prophylaxis: Aspirin 325mg  DME Needed: Gilford Rile PCP: Dr. Sheryn Bison - clearance received Cardiology: Dr. Marlou Porch - clearance received - stop 81mg  Aspirin.  TXA: IV Allergies: Sulfa Anesthesia Concerns: None BMI: 37.7 Last HgbA1c: 6.7  Pharmacy: [facility] on [address] in [place], [place]   - Patient was instructed on what medications to stop prior to surgery. - Follow-up visit in 2 weeks with Dr. Wynelle Link - Begin physical therapy following surgery - Pre-operative lab work as pre-surgical testing - Prescriptions will be provided in hospital at time of discharge  Fenton Foy, [facility], PA-C Orthopedic Surgery [facility] [place]

## 2021-03-08 ENCOUNTER — Encounter (HOSPITAL_COMMUNITY): Payer: Self-pay | Admitting: Orthopedic Surgery

## 2021-03-08 ENCOUNTER — Encounter (HOSPITAL_COMMUNITY)
Admission: RE | Disposition: A | Discharge status: Home / Self Care | Payer: Self-pay | Source: Home / Self Care | Attending: Orthopedic Surgery

## 2021-03-08 ENCOUNTER — Ambulatory Visit (HOSPITAL_COMMUNITY): Payer: Medicare HMO | Admitting: Certified Registered Nurse Anesthetist

## 2021-03-08 ENCOUNTER — Other Ambulatory Visit: Payer: Self-pay

## 2021-03-08 ENCOUNTER — Ambulatory Visit (HOSPITAL_COMMUNITY): Payer: Medicare HMO | Admitting: Physician Assistant

## 2021-03-08 ENCOUNTER — Observation Stay (HOSPITAL_COMMUNITY)
Admission: RE | Admit: 2021-03-08 | Discharge: 2021-03-09 | Disposition: A | Discharge status: Home / Self Care | Payer: Medicare HMO | Attending: Orthopedic Surgery | Admitting: Orthopedic Surgery

## 2021-03-08 DIAGNOSIS — Z87891 Personal history of nicotine dependence: Secondary | ICD-10-CM | POA: Diagnosis not present

## 2021-03-08 DIAGNOSIS — I2510 Atherosclerotic heart disease of native coronary artery without angina pectoris: Secondary | ICD-10-CM | POA: Insufficient documentation

## 2021-03-08 DIAGNOSIS — Z955 Presence of coronary angioplasty implant and graft: Secondary | ICD-10-CM | POA: Diagnosis not present

## 2021-03-08 DIAGNOSIS — Z7982 Long term (current) use of aspirin: Secondary | ICD-10-CM | POA: Insufficient documentation

## 2021-03-08 DIAGNOSIS — I10 Essential (primary) hypertension: Secondary | ICD-10-CM | POA: Diagnosis not present

## 2021-03-08 DIAGNOSIS — Z7984 Long term (current) use of oral hypoglycemic drugs: Secondary | ICD-10-CM | POA: Diagnosis not present

## 2021-03-08 DIAGNOSIS — Z79899 Other long term (current) drug therapy: Secondary | ICD-10-CM | POA: Diagnosis not present

## 2021-03-08 DIAGNOSIS — M1710 Unilateral primary osteoarthritis, unspecified knee: Secondary | ICD-10-CM | POA: Diagnosis present

## 2021-03-08 DIAGNOSIS — G8918 Other acute postprocedural pain: Secondary | ICD-10-CM | POA: Diagnosis not present

## 2021-03-08 DIAGNOSIS — E119 Type 2 diabetes mellitus without complications: Secondary | ICD-10-CM | POA: Insufficient documentation

## 2021-03-08 DIAGNOSIS — M1712 Unilateral primary osteoarthritis, left knee: Secondary | ICD-10-CM | POA: Diagnosis not present

## 2021-03-08 DIAGNOSIS — M179 Osteoarthritis of knee, unspecified: Secondary | ICD-10-CM | POA: Diagnosis present

## 2021-03-08 DIAGNOSIS — E7800 Pure hypercholesterolemia, unspecified: Secondary | ICD-10-CM | POA: Diagnosis not present

## 2021-03-08 HISTORY — PX: TOTAL KNEE ARTHROPLASTY: SHX125

## 2021-03-08 LAB — GLUCOSE, CAPILLARY
Glucose-Capillary: 134 mg/dL — ABNORMAL HIGH (ref 70–99)
Glucose-Capillary: 133 mg/dL — ABNORMAL HIGH (ref 70–99)
Glucose-Capillary: 259 mg/dL — ABNORMAL HIGH (ref 70–99)
Glucose-Capillary: 244 mg/dL — ABNORMAL HIGH (ref 70–99)

## 2021-03-08 LAB — ABO/RH: ABO/RH(D): A NEG

## 2021-03-08 SURGERY — ARTHROPLASTY, KNEE, TOTAL
Anesthesia: Spinal | Site: Knee | Laterality: Left

## 2021-03-08 MED ORDER — DEXAMETHASONE SODIUM PHOSPHATE 10 MG/ML IJ SOLN
INTRAMUSCULAR | Status: AC
  Filled 2021-03-08: qty 1

## 2021-03-08 MED ORDER — FLEET ENEMA 7-19 GM/118ML RE ENEM: 1.0000 | ENEMA | Freq: Once | RECTAL | Status: DC | PRN

## 2021-03-08 MED ORDER — SODIUM CHLORIDE 0.9 % IV SOLN: INTRAVENOUS | Status: DC | PRN

## 2021-03-08 MED ORDER — ACETAMINOPHEN 500 MG PO TABS
1000.0000 mg | ORAL_TABLET | Freq: Four times a day (QID) | ORAL | Status: AC
  Administered 2021-03-08 – 2021-03-09 (×4): 1000 mg via ORAL
  Filled 2021-03-08 (×4): qty 2

## 2021-03-08 MED ORDER — ORAL CARE MOUTH RINSE: 15.0000 mL | Freq: Once | OROMUCOSAL | Status: AC

## 2021-03-08 MED ORDER — SODIUM CHLORIDE (PF) 0.9 % IJ SOLN
INTRAMUSCULAR | Status: DC | PRN
  Administered 2021-03-08: 60 mL

## 2021-03-08 MED ORDER — METOCLOPRAMIDE HCL 5 MG PO TABS: 5.0000 mg | ORAL_TABLET | Freq: Three times a day (TID) | ORAL | Status: DC | PRN

## 2021-03-08 MED ORDER — TRAMADOL HCL 50 MG PO TABS: 50.0000 mg | ORAL_TABLET | Freq: Four times a day (QID) | ORAL | Status: DC | PRN

## 2021-03-08 MED ORDER — CEFAZOLIN SODIUM-DEXTROSE 2-4 GM/100ML-% IV SOLN
2.0000 g | Freq: Four times a day (QID) | INTRAVENOUS | Status: AC
  Administered 2021-03-08 (×2): 2 g via INTRAVENOUS
  Filled 2021-03-08 (×2): qty 100

## 2021-03-08 MED ORDER — ONDANSETRON HCL 4 MG/2ML IJ SOLN
INTRAMUSCULAR | Status: DC | PRN
  Administered 2021-03-08: 4 mg via INTRAVENOUS

## 2021-03-08 MED ORDER — PHENOL 1.4 % MT LIQD: 1.0000 | OROMUCOSAL | Status: DC | PRN

## 2021-03-08 MED ORDER — ACETAMINOPHEN 10 MG/ML IV SOLN
1000.0000 mg | Freq: Four times a day (QID) | INTRAVENOUS | Status: DC
  Administered 2021-03-08: 1000 mg via INTRAVENOUS
  Filled 2021-03-08: qty 100

## 2021-03-08 MED ORDER — NITROGLYCERIN 0.4 MG SL SUBL: 0.4000 mg | SUBLINGUAL_TABLET | SUBLINGUAL | Status: DC | PRN

## 2021-03-08 MED ORDER — METOPROLOL SUCCINATE ER 50 MG PO TB24
50.0000 mg | ORAL_TABLET | Freq: Every day | ORAL | Status: DC
  Administered 2021-03-09: 50 mg via ORAL
  Filled 2021-03-08: qty 1

## 2021-03-08 MED ORDER — BUPIVACAINE HCL (PF) 0.5 % IJ SOLN
INTRAMUSCULAR | Status: DC | PRN
  Administered 2021-03-08: 20 mL via PERINEURAL

## 2021-03-08 MED ORDER — OXYCODONE HCL 5 MG PO TABS
5.0000 mg | ORAL_TABLET | ORAL | Status: DC | PRN
  Administered 2021-03-08 – 2021-03-09 (×6): 10 mg via ORAL
  Filled 2021-03-08 (×6): qty 2

## 2021-03-08 MED ORDER — SODIUM CHLORIDE 0.9 % IV SOLN: INTRAVENOUS | Status: DC

## 2021-03-08 MED ORDER — INSULIN ASPART 100 UNIT/ML ~~LOC~~ SOLN
0.0000 [IU] | Freq: Every day | SUBCUTANEOUS | Status: DC
  Administered 2021-03-08: 2 [IU] via SUBCUTANEOUS

## 2021-03-08 MED ORDER — EPHEDRINE 5 MG/ML INJ
INTRAVENOUS | Status: AC
  Filled 2021-03-08: qty 10

## 2021-03-08 MED ORDER — TRANEXAMIC ACID-NACL 1000-0.7 MG/100ML-% IV SOLN
1000.0000 mg | INTRAVENOUS | Status: AC
  Administered 2021-03-08: 1000 mg via INTRAVENOUS
  Filled 2021-03-08: qty 100

## 2021-03-08 MED ORDER — FENTANYL CITRATE (PF) 100 MCG/2ML IJ SOLN
50.0000 ug | INTRAMUSCULAR | Status: DC
  Administered 2021-03-08: 50 ug via INTRAVENOUS

## 2021-03-08 MED ORDER — MENTHOL 3 MG MT LOZG: 1.0000 | LOZENGE | OROMUCOSAL | Status: DC | PRN

## 2021-03-08 MED ORDER — DEXAMETHASONE SODIUM PHOSPHATE 10 MG/ML IJ SOLN
10.0000 mg | Freq: Once | INTRAMUSCULAR | Status: AC
  Administered 2021-03-09: 10 mg via INTRAVENOUS
  Filled 2021-03-08: qty 1

## 2021-03-08 MED ORDER — METHOCARBAMOL 1000 MG/10ML IJ SOLN
500.0000 mg | Freq: Four times a day (QID) | INTRAMUSCULAR | Status: DC | PRN
  Filled 2021-03-08: qty 5

## 2021-03-08 MED ORDER — LIDOCAINE 2% (20 MG/ML) 5 ML SYRINGE
INTRAMUSCULAR | Status: AC
  Filled 2021-03-08: qty 5

## 2021-03-08 MED ORDER — HYDROMORPHONE HCL 1 MG/ML IJ SOLN: 0.2500 mg | INTRAMUSCULAR | Status: DC | PRN

## 2021-03-08 MED ORDER — RIVAROXABAN 10 MG PO TABS
10.0000 mg | ORAL_TABLET | Freq: Every day | ORAL | Status: DC
  Administered 2021-03-09: 10 mg via ORAL
  Filled 2021-03-08: qty 1

## 2021-03-08 MED ORDER — ONDANSETRON HCL 4 MG/2ML IJ SOLN: 4.0000 mg | Freq: Once | INTRAMUSCULAR | Status: DC | PRN

## 2021-03-08 MED ORDER — BUPIVACAINE LIPOSOME 1.3 % IJ SUSP
INTRAMUSCULAR | Status: DC | PRN
  Administered 2021-03-08: 20 mL

## 2021-03-08 MED ORDER — ONDANSETRON HCL 4 MG/2ML IJ SOLN: 4.0000 mg | Freq: Four times a day (QID) | INTRAMUSCULAR | Status: DC | PRN

## 2021-03-08 MED ORDER — CHLORHEXIDINE GLUCONATE CLOTH 2 % EX PADS
6.0000 | MEDICATED_PAD | Freq: Every day | CUTANEOUS | Status: DC
  Administered 2021-03-09: 6 via TOPICAL

## 2021-03-08 MED ORDER — ATORVASTATIN CALCIUM 40 MG PO TABS
80.0000 mg | ORAL_TABLET | Freq: Every day | ORAL | Status: DC
  Administered 2021-03-09: 80 mg via ORAL
  Filled 2021-03-08: qty 2

## 2021-03-08 MED ORDER — SODIUM CHLORIDE (PF) 0.9 % IJ SOLN
INTRAMUSCULAR | Status: AC
  Filled 2021-03-08: qty 60

## 2021-03-08 MED ORDER — GABAPENTIN 300 MG PO CAPS
300.0000 mg | ORAL_CAPSULE | Freq: Three times a day (TID) | ORAL | Status: DC
  Administered 2021-03-08 – 2021-03-09 (×4): 300 mg via ORAL
  Filled 2021-03-08 (×4): qty 1

## 2021-03-08 MED ORDER — MIDAZOLAM HCL 2 MG/2ML IJ SOLN
INTRAMUSCULAR | Status: AC
  Filled 2021-03-08: qty 2

## 2021-03-08 MED ORDER — POVIDONE-IODINE 10 % EX SWAB
2.0000 "application " | Freq: Once | CUTANEOUS | Status: AC
  Administered 2021-03-08: 2 via TOPICAL

## 2021-03-08 MED ORDER — EPHEDRINE SULFATE-NACL 50-0.9 MG/10ML-% IV SOSY
PREFILLED_SYRINGE | INTRAVENOUS | Status: DC | PRN
  Administered 2021-03-08 (×2): 10 mg via INTRAVENOUS

## 2021-03-08 MED ORDER — ISOSORBIDE MONONITRATE ER 30 MG PO TB24
30.0000 mg | ORAL_TABLET | Freq: Every day | ORAL | Status: DC
  Administered 2021-03-09: 30 mg via ORAL
  Filled 2021-03-08: qty 1

## 2021-03-08 MED ORDER — LACTATED RINGERS IV SOLN: INTRAVENOUS | Status: DC

## 2021-03-08 MED ORDER — LIDOCAINE 2% (20 MG/ML) 5 ML SYRINGE
INTRAMUSCULAR | Status: DC | PRN
  Administered 2021-03-08: 60 mg via INTRAVENOUS

## 2021-03-08 MED ORDER — POLYETHYLENE GLYCOL 3350 17 G PO PACK: 17.0000 g | PACK | Freq: Every day | ORAL | Status: DC | PRN

## 2021-03-08 MED ORDER — HYDROCHLOROTHIAZIDE 25 MG PO TABS
25.0000 mg | ORAL_TABLET | Freq: Every day | ORAL | Status: DC
  Administered 2021-03-09: 25 mg via ORAL
  Filled 2021-03-08: qty 1

## 2021-03-08 MED ORDER — FENTANYL CITRATE (PF) 100 MCG/2ML IJ SOLN
INTRAMUSCULAR | Status: AC
  Filled 2021-03-08: qty 2

## 2021-03-08 MED ORDER — DOCUSATE SODIUM 100 MG PO CAPS
100.0000 mg | ORAL_CAPSULE | Freq: Two times a day (BID) | ORAL | Status: DC
  Administered 2021-03-08 – 2021-03-09 (×3): 100 mg via ORAL
  Filled 2021-03-08 (×3): qty 1

## 2021-03-08 MED ORDER — METHOCARBAMOL 500 MG PO TABS
500.0000 mg | ORAL_TABLET | Freq: Four times a day (QID) | ORAL | Status: DC | PRN
  Administered 2021-03-08 – 2021-03-09 (×3): 500 mg via ORAL
  Filled 2021-03-08 (×3): qty 1

## 2021-03-08 MED ORDER — ACETAMINOPHEN 10 MG/ML IV SOLN: 1000.0000 mg | Freq: Once | INTRAVENOUS | Status: DC | PRN

## 2021-03-08 MED ORDER — INSULIN ASPART 100 UNIT/ML ~~LOC~~ SOLN
0.0000 [IU] | Freq: Three times a day (TID) | SUBCUTANEOUS | Status: DC
  Administered 2021-03-08: 8 [IU] via SUBCUTANEOUS
  Administered 2021-03-09: 3 [IU] via SUBCUTANEOUS

## 2021-03-08 MED ORDER — DIPHENHYDRAMINE HCL 12.5 MG/5ML PO ELIX: 12.5000 mg | ORAL_SOLUTION | ORAL | Status: DC | PRN

## 2021-03-08 MED ORDER — ONDANSETRON HCL 4 MG PO TABS: 4.0000 mg | ORAL_TABLET | Freq: Four times a day (QID) | ORAL | Status: DC | PRN

## 2021-03-08 MED ORDER — BUPIVACAINE IN DEXTROSE 0.75-8.25 % IT SOLN
INTRATHECAL | Status: DC | PRN
  Administered 2021-03-08: 1.8 mL via INTRATHECAL

## 2021-03-08 MED ORDER — CHLORHEXIDINE GLUCONATE 0.12 % MT SOLN
15.0000 mL | Freq: Once | OROMUCOSAL | Status: AC
  Administered 2021-03-08: 15 mL via OROMUCOSAL

## 2021-03-08 MED ORDER — LOSARTAN POTASSIUM 50 MG PO TABS
100.0000 mg | ORAL_TABLET | Freq: Every day | ORAL | Status: DC
  Administered 2021-03-09: 100 mg via ORAL
  Filled 2021-03-08: qty 2

## 2021-03-08 MED ORDER — MORPHINE SULFATE (PF) 2 MG/ML IV SOLN: 0.5000 mg | INTRAVENOUS | Status: DC | PRN

## 2021-03-08 MED ORDER — AMLODIPINE BESYLATE 5 MG PO TABS
2.5000 mg | ORAL_TABLET | Freq: Every day | ORAL | Status: DC
  Administered 2021-03-09: 2.5 mg via ORAL
  Filled 2021-03-08: qty 1

## 2021-03-08 MED ORDER — ONDANSETRON HCL 4 MG/2ML IJ SOLN
INTRAMUSCULAR | Status: AC
  Filled 2021-03-08: qty 2

## 2021-03-08 MED ORDER — METOCLOPRAMIDE HCL 5 MG/ML IJ SOLN: 5.0000 mg | Freq: Three times a day (TID) | INTRAMUSCULAR | Status: DC | PRN

## 2021-03-08 MED ORDER — PROPOFOL 1000 MG/100ML IV EMUL
INTRAVENOUS | Status: AC
  Filled 2021-03-08: qty 100

## 2021-03-08 MED ORDER — PROPOFOL 500 MG/50ML IV EMUL
INTRAVENOUS | Status: DC | PRN
  Administered 2021-03-08: 100 ug/kg/min via INTRAVENOUS
  Administered 2021-03-08: 40 mg via INTRAVENOUS

## 2021-03-08 MED ORDER — MIDAZOLAM HCL 2 MG/2ML IJ SOLN
1.0000 mg | INTRAMUSCULAR | Status: DC
  Administered 2021-03-08: 1 mg via INTRAVENOUS

## 2021-03-08 MED ORDER — DEXAMETHASONE SODIUM PHOSPHATE 10 MG/ML IJ SOLN: 8.0000 mg | Freq: Once | INTRAMUSCULAR | Status: DC

## 2021-03-08 MED ORDER — BISACODYL 10 MG RE SUPP: 10.0000 mg | Freq: Every day | RECTAL | Status: DC | PRN

## 2021-03-08 MED ORDER — CEFAZOLIN SODIUM-DEXTROSE 2-4 GM/100ML-% IV SOLN
2.0000 g | INTRAVENOUS | Status: AC
  Administered 2021-03-08: 2 g via INTRAVENOUS
  Filled 2021-03-08: qty 100

## 2021-03-08 MED ORDER — SODIUM CHLORIDE 0.9 % IR SOLN
Status: DC | PRN
  Administered 2021-03-08: 1000 mL

## 2021-03-08 SURGICAL SUPPLY — 51 items
ATTUNE MED DOME PAT 41 KNEE (Knees) ×2 IMPLANT
ATTUNE PS FEM LT SZ 7 CEM KNEE (Femur) ×2 IMPLANT
ATTUNE PSRP INSR SZ7 7 KNEE (Insert) ×2 IMPLANT
BASE TIBIAL ROT PLAT SZ 8 KNEE (Knees) ×1 IMPLANT
BLADE SAG 18X100X1.27 (BLADE) ×2 IMPLANT
BLADE SAW SGTL 11.0X1.19X90.0M (BLADE) ×2 IMPLANT
BNDG CMPR MED 10X6 ELC LF (GAUZE/BANDAGES/DRESSINGS) ×1
BNDG ELASTIC 6X10 VLCR STRL LF (GAUZE/BANDAGES/DRESSINGS) ×2 IMPLANT
BNDG ELASTIC 6X5.8 VLCR STR LF (GAUZE/BANDAGES/DRESSINGS) ×2 IMPLANT
BOWL SMART MIX CTS (DISPOSABLE) ×2 IMPLANT
BSPLAT TIB 8 CMNT ROT PLAT STR (Knees) ×1 IMPLANT
CEMENT HV SMART SET (Cement) ×4 IMPLANT
COVER SURGICAL LIGHT HANDLE (MISCELLANEOUS) ×2 IMPLANT
CUFF TOURN SGL QUICK 34 (TOURNIQUET CUFF) ×2
CUFF TRNQT CYL 34X4.125X (TOURNIQUET CUFF) ×1 IMPLANT
DECANTER SPIKE VIAL GLASS SM (MISCELLANEOUS) ×2 IMPLANT
DRAPE U-SHAPE 47X51 STRL (DRAPES) ×2 IMPLANT
DRSG AQUACEL AG ADV 3.5X10 (GAUZE/BANDAGES/DRESSINGS) ×2 IMPLANT
DURAPREP 26ML APPLICATOR (WOUND CARE) ×2 IMPLANT
ELECT REM PT RETURN 15FT ADLT (MISCELLANEOUS) ×2 IMPLANT
GLOVE SRG 8 PF TXTR STRL LF DI (GLOVE) ×1 IMPLANT
GLOVE SURG ENC MOIS LTX SZ6.5 (GLOVE) ×2 IMPLANT
GLOVE SURG ENC MOIS LTX SZ8 (GLOVE) ×4 IMPLANT
GLOVE SURG UNDER POLY LF SZ7 (GLOVE) ×2 IMPLANT
GLOVE SURG UNDER POLY LF SZ8 (GLOVE) ×2
GLOVE SURG UNDER POLY LF SZ8.5 (GLOVE) ×2 IMPLANT
GOWN STRL REUS W/TWL LRG LVL3 (GOWN DISPOSABLE) ×4 IMPLANT
GOWN STRL REUS W/TWL XL LVL3 (GOWN DISPOSABLE) ×2 IMPLANT
HANDPIECE INTERPULSE COAX TIP (DISPOSABLE) ×2
IMMOBILIZER KNEE 22  40 CIR (ORTHOPEDIC SUPPLIES) ×1
IMMOBILIZER KNEE 22 40 CIR (ORTHOPEDIC SUPPLIES) ×1 IMPLANT
KIT TURNOVER KIT A (KITS) ×2 IMPLANT
MANIFOLD NEPTUNE II (INSTRUMENTS) ×2 IMPLANT
NS IRRIG 1000ML POUR BTL (IV SOLUTION) ×2 IMPLANT
PACK ICE MAXI GEL EZY WRAP (MISCELLANEOUS) ×2 IMPLANT
PACK TOTAL KNEE CUSTOM (KITS) ×2 IMPLANT
PADDING CAST SYN 6 (CAST SUPPLIES) ×1
PADDING CAST SYNTHETIC 6X4 NS (CAST SUPPLIES) ×1 IMPLANT
PENCIL SMOKE EVACUATOR (MISCELLANEOUS) ×2 IMPLANT
PIN DRILL FIX HALF THREAD (BIT) ×2 IMPLANT
PIN STEINMAN FIXATION KNEE (PIN) ×2 IMPLANT
PROTECTOR NERVE ULNAR (MISCELLANEOUS) ×2 IMPLANT
SET HNDPC FAN SPRY TIP SCT (DISPOSABLE) ×1 IMPLANT
SUT MNCRL AB 4-0 PS2 18 (SUTURE) ×2 IMPLANT
SUT STRATAFIX 0 PDS 27 VIOLET (SUTURE) ×2
SUT VIC AB 2-0 CT1 27 (SUTURE) ×6
SUT VIC AB 2-0 CT1 TAPERPNT 27 (SUTURE) ×3 IMPLANT
SUTURE STRATFX 0 PDS 27 VIOLET (SUTURE) ×1 IMPLANT
TIBIAL BASE ROT PLAT SZ 8 KNEE (Knees) ×2 IMPLANT
TUBE SUCTION HIGH CAP CLEAR NV (SUCTIONS) ×2 IMPLANT
WATER STERILE IRR 1000ML POUR (IV SOLUTION) ×4 IMPLANT

## 2021-03-08 NOTE — Op Note (Signed)
OPERATIVE REPORT-TOTAL KNEE ARTHROPLASTY   Pre-operative diagnosis- Osteoarthritis  Left knee(s)  Post-operative diagnosis- Osteoarthritis Left knee(s)  Procedure-  Left  Total Knee Arthroplasty  Surgeon- Dione Plover. Tauheedah Bok, MD  Assistant- Molli Barrows, PA-C   Anesthesia-  Adductor canal block and spinal  EBL 25 ml   Drains None  Tourniquet time- 35 minutes @ 893 mm Hg  Complications- None  Condition-PACU - hemodynamically stable.   Brief Clinical Note   John Wilson is a 75 y.o. year old male with end stage OA of his left knee with progressively worsening pain and dysfunction. He has constant pain, with activity and at rest and significant functional deficits with difficulties even with ADLs. He has had extensive non-op management including analgesics, injections of cortisone and viscosupplements, and home exercise program, but remains in significant pain with significant dysfunction. Radiographs show bone on bone arthritis medial and patellofemoral. He presents now for left Total Knee Arthroplasty.    Procedure in detail---   The patient is brought into the operating room and positioned supine on the operating table. After successful administration of  Adductor canal block and spinal,   a tourniquet is placed high on the  Left thigh(s) and the lower extremity is prepped and draped in the usual sterile fashion. Time out is performed by the operating team and then the  Left lower extremity is wrapped in Esmarch, knee flexed and the tourniquet inflated to 300 mmHg.       A midline incision is made with a ten blade through the subcutaneous tissue to the level of the extensor mechanism. A fresh blade is used to make a medial parapatellar arthrotomy. Soft tissue over the proximal medial tibia is subperiosteally elevated to the joint line with a knife and into the semimembranosus bursa with a Cobb elevator. Soft tissue over the proximal lateral tibia is elevated with attention being paid  to avoiding the patellar tendon on the tibial tubercle. The patella is everted, knee flexed 90 degrees and the ACL and PCL are removed. Findings are bone on bone medial and patellofemoral with large global osteophytes.        The drill is used to create a starting hole in the distal femur and the canal is thoroughly irrigated with sterile saline to remove the fatty contents. The 5 degree Left  valgus alignment guide is placed into the femoral canal and the distal femoral cutting block is pinned to remove 9 mm off the distal femur. Resection is made with an oscillating saw.      The tibia is subluxed forward and the menisci are removed. The extramedullary alignment guide is placed referencing proximally at the medial aspect of the tibial tubercle and distally along the second metatarsal axis and tibial crest. The block is pinned to remove 75mm off the more deficient medial  side. Resection is made with an oscillating saw. Size 8is the most appropriate size for the tibia and the proximal tibia is prepared with the modular drill and keel punch for that size.      The femoral sizing guide is placed and size 7 is most appropriate. Rotation is marked off the epicondylar axis and confirmed by creating a rectangular flexion gap at 90 degrees. The size 7 cutting block is pinned in this rotation and the anterior, posterior and chamfer cuts are made with the oscillating saw. The intercondylar block is then placed and that cut is made.      Trial size 8 tibial component, trial size 7  posterior stabilized femur and a 7  mm posterior stabilized rotating platform insert trial is placed. Full extension is achieved with excellent varus/valgus and anterior/posterior balance throughout full range of motion. The patella is everted and thickness measured to be 27  mm. Free hand resection is taken to 15 mm, a 41 template is placed, lug holes are drilled, trial patella is placed, and it tracks normally. Osteophytes are removed off the  posterior femur with the trial in place. All trials are removed and the cut bone surfaces prepared with pulsatile lavage. Cement is mixed and once ready for implantation, the size 8 tibial implant, size  7 posterior stabilized femoral component, and the size 41 patella are cemented in place and the patella is held with the clamp. The trial insert is placed and the knee held in full extension. The Exparel (20 ml mixed with 60 ml saline) is injected into the extensor mechanism, posterior capsule, medial and lateral gutters and subcutaneous tissues.  All extruded cement is removed and once the cement is hard the permanent 7 mm posterior stabilized rotating platform insert is placed into the tibial tray.      The wound is copiously irrigated with saline solution and the extensor mechanism closed with # 0 Stratofix suture. The tourniquet is released for a total tourniquet time of 35  minutes. Flexion against gravity is 140 degrees and the patella tracks normally. Subcutaneous tissue is closed with 2.0 vicryl and subcuticular with running 4.0 Monocryl. The incision is cleaned and dried and steri-strips and a bulky sterile dressing are applied. The limb is placed into a knee immobilizer and the patient is awakened and transported to recovery in stable condition.      Please note that a surgical assistant was a medical necessity for this procedure in order to perform it in a safe and expeditious manner. Surgical assistant was necessary to retract the ligaments and vital neurovascular structures to prevent injury to them and also necessary for proper positioning of the limb to allow for anatomic placement of the prosthesis.   Dione Plover Denys Salinger, MD    03/08/2021, 9:24 AM

## 2021-03-08 NOTE — Discharge Instructions (Addendum)
 Frank Aluisio, MD Total Joint Specialist [facility] [address] [place], [place] [address] [phone]  TOTAL KNEE REPLACEMENT POSTOPERATIVE DIRECTIONS    Knee Rehabilitation, Guidelines Following Surgery  Results after knee surgery are often greatly improved when you follow the exercise, range of motion and muscle strengthening exercises prescribed by your doctor. Safety measures are also important to protect the knee from further injury. If any of these exercises cause you to have increased pain or swelling in your knee joint, decrease the amount until you are comfortable again and slowly increase them. If you have problems or questions, call your caregiver or physical therapist for advice.   BLOOD CLOT PREVENTION . Take a 10 mg Xarelto once a day for three weeks following surgery. Then resume one 81 mg aspirin once a day. . You may resume your vitamins/supplements once you have discontinued the Xarelto. . Do not take any NSAIDs (Advil, Aleve, Ibuprofen, Meloxicam, etc.) until you have discontinued the Xarelto.   HOME CARE INSTRUCTIONS  . Remove items at home which could result in a fall. This includes throw rugs or furniture in walking pathways.  . ICE to the affected knee as much as tolerated. Icing helps control swelling. If the swelling is well controlled you will be more comfortable and rehab easier. Continue to use ice on the knee for pain and swelling from surgery. You may notice swelling that will progress down to the foot and ankle. This is normal after surgery. Elevate the leg when you are not up walking on it.    . Continue to use the breathing machine which will help keep your temperature down. It is common for your temperature to cycle up and down following surgery, especially at night when you are not up moving around and exerting yourself. The breathing machine keeps your lungs expanded and your temperature down. . Do not place pillow under  the operative knee, focus on keeping the knee straight while resting  DIET You may resume your previous home diet once you are discharged from the hospital.  DRESSING / WOUND CARE / SHOWERING . Keep your bulky bandage on for 2 days. On the third post-operative day you may remove the Ace bandage and gauze. There is a waterproof adhesive bandage on your skin which will stay in place until your first follow-up appointment. Once you remove this you will not need to place another bandage . You may begin showering 3 days following surgery, but do not submerge the incision under water.  ACTIVITY For the first 5 days, the key is rest and control of pain and swelling . Do your home exercises twice a day starting on post-operative day 3. On the days you go to physical therapy, just do the home exercises once that day. . You should rest, ice and elevate the leg for 50 minutes out of every hour. Get up and walk/stretch for 10 minutes per hour. After 5 days you can increase your activity slowly as tolerated. . Walk with your walker as instructed. Use the walker until you are comfortable transitioning to a cane. Walk with the cane in the opposite hand of the operative leg. You may discontinue the cane once you are comfortable and walking steadily. . Avoid periods of inactivity such as sitting longer than an hour when not asleep. This helps prevent blood clots.  . You may discontinue the knee immobilizer once you are able to perform a straight leg raise while lying down. . You   may resume a sexual relationship in one month or when given the OK by your doctor.  . You may return to work once you are cleared by your doctor.  . Do not drive a car for 6 weeks or until released by your surgeon.  . Do not drive while taking narcotics.  TED HOSE STOCKINGS Wear the elastic stockings on both legs for three weeks following surgery during the day. You may remove them at night for sleeping.  WEIGHT BEARING Weight  bearing as tolerated with assist device (walker, cane, etc) as directed, use it as long as suggested by your surgeon or therapist, typically at least 4-6 weeks.  POSTOPERATIVE CONSTIPATION PROTOCOL Constipation - defined medically as fewer than three stools per week and severe constipation as less than one stool per week.  One of the most common issues patients have following surgery is constipation.  Even if you have a regular bowel pattern at home, your normal regimen is likely to be disrupted due to multiple reasons following surgery.  Combination of anesthesia, postoperative narcotics, change in appetite and fluid intake all can affect your bowels.  In order to avoid complications following surgery, here are some recommendations in order to help you during your recovery period.  . Colace (docusate) - Pick up an over-the-counter form of Colace or another stool softener and take twice a day as long as you are requiring postoperative pain medications.  Take with a full glass of water daily.  If you experience loose stools or diarrhea, hold the colace until you stool forms back up. If your symptoms do not get better within 1 week or if they get worse, check with your doctor. . Dulcolax (bisacodyl) - Pick up over-the-counter and take as directed by the product packaging as needed to assist with the movement of your bowels.  Take with a full glass of water.  Use this product as needed if not relieved by Colace only.  . MiraLax (polyethylene glycol) - Pick up over-the-counter to have on hand. MiraLax is a solution that will increase the amount of water in your bowels to assist with bowel movements.  Take as directed and can mix with a glass of water, juice, soda, coffee, or tea. Take if you go more than two days without a movement. Do not use MiraLax more than once per day. Call your doctor if you are still constipated or irregular after using this medication for 7 days in a row.  If you continue to have  problems with postoperative constipation, please contact the office for further assistance and recommendations.  If you experience "the worst abdominal pain ever" or develop nausea or vomiting, please contact the office immediatly for further recommendations for treatment.  ITCHING If you experience itching with your medications, try taking only a single pain pill, or even half a pain pill at a time.  You can also use Benadryl over the counter for itching or also to help with sleep.   MEDICATIONS See your medication summary on the "After Visit Summary" that the nursing staff will review with you prior to discharge.  You may have some home medications which will be placed on hold until you complete the course of blood thinner medication.  It is important for you to complete the blood thinner medication as prescribed by your surgeon.  Continue your approved medications as instructed at time of discharge.  PRECAUTIONS . If you experience chest pain or shortness of breath - call 911 immediately for transfer   to the hospital emergency department.  . If you develop a fever greater that 101 F, purulent drainage from wound, increased redness or drainage from wound, foul odor from the wound/dressing, or calf pain - CONTACT YOUR SURGEON.                                                   FOLLOW-UP APPOINTMENTS Make sure you keep all of your appointments after your operation with your surgeon and caregivers. You should call the office at the above phone number and make an appointment for approximately two weeks after the date of your surgery or on the date instructed by your surgeon outlined in the "After Visit Summary".  RANGE OF MOTION AND STRENGTHENING EXERCISES  Rehabilitation of the knee is important following a knee injury or an operation. After just a few days of immobilization, the muscles of the thigh which control the knee become weakened and shrink (atrophy). Knee exercises are designed to build up the  tone and strength of the thigh muscles and to improve knee motion. Often times heat used for twenty to thirty minutes before working out will loosen up your tissues and help with improving the range of motion but do not use heat for the first two weeks following surgery. These exercises can be done on a training (exercise) mat, on the floor, on a table or on a bed. Use what ever works the best and is most comfortable for you Knee exercises include:  . Leg Lifts - While your knee is still immobilized in a splint or cast, you can do straight leg raises. Lift the leg to 60 degrees, hold for 3 sec, and slowly lower the leg. Repeat 10-20 times 2-3 times daily. Perform this exercise against resistance later as your knee gets better.  Javier Docker and Hamstring Sets - Tighten up the muscle on the front of the thigh (Quad) and hold for 5-10 sec. Repeat this 10-20 times hourly. Hamstring sets are done by pushing the foot backward against an object and holding for 5-10 sec. Repeat as with quad sets.   Leg Slides: Lying on your back, slowly slide your foot toward your buttocks, bending your knee up off the floor (only go as far as is comfortable). Then slowly slide your foot back down until your leg is flat on the floor again.  Angel Wings: Lying on your back spread your legs to the side as far apart as you can without causing discomfort.  A rehabilitation program following serious knee injuries can speed recovery and prevent re-injury in the future due to weakened muscles. Contact your doctor or a physical therapist for more information on knee rehabilitation.   POST-OPERATIVE OPIOID TAPER INSTRUCTIONS: . It is important to wean off of your opioid medication as soon as possible. If you do not need pain medication after your surgery it is ok to stop day one. Marland Kitchen Opioids include: o Codeine, Hydrocodone(Norco, Vicodin), Oxycodone(Percocet, oxycontin) and hydromorphone amongst others.  . Long term and even short term use of  opiods can cause: o Increased pain response o Dependence o Constipation o Depression o Respiratory depression o And more.  . Withdrawal symptoms can include o Flu like symptoms o Nausea, vomiting o And more . Techniques to manage these symptoms o Hydrate well o Eat regular healthy meals o Stay active o Use  relaxation techniques(deep breathing, meditating, yoga) . Do Not substitute Alcohol to help with tapering . If you have been on opioids for less than two weeks and do not have pain than it is ok to stop all together.  . Plan to wean off of opioids o This plan should start within one week post op of your joint replacement. o Maintain the same interval or time between taking each dose and first decrease the dose.  o Cut the total daily intake of opioids by one tablet each day o Next start to increase the time between doses. o The last dose that should be eliminated is the evening dose.     IF YOU ARE TRANSFERRED TO A SKILLED REHAB FACILITY If the patient is transferred to a skilled rehab facility following release from the hospital, a list of the current medications will be sent to the facility for the patient to continue.  When discharged from the skilled rehab facility, please have the facility set up the patient's Home Health Physical Therapy prior to being released. Also, the skilled facility will be responsible for providing the patient with their medications at time of release from the facility to include their pain medication, the muscle relaxants, and their blood thinner medication. If the patient is still at the rehab facility at time of the two week follow up appointment, the skilled rehab facility will also need to assist the patient in arranging follow up appointment in our office and any transportation needs.  MAKE SURE YOU:  . Understand these instructions.  . Get help right away if you are not doing well or get worse.   DENTAL ANTIBIOTICS:  In most cases  prophylactic antibiotics for Dental procdeures after total joint surgery are not necessary.  Exceptions are as follows:  1. History of prior total joint infection  2. Severely immunocompromised (Organ Transplant, cancer chemotherapy, Rheumatoid biologic meds such as Humera)  3. Poorly controlled diabetes (A1C &gt; 8.0, blood glucose over 200)  If you have one of these conditions, contact your surgeon for an antibiotic prescription, prior to your dental procedure.    Pick up stool softner and laxative for home use following surgery while on pain medications. Do not submerge incision under water. Please use good hand washing techniques while changing dressing each day. May shower starting three days after surgery. Please use a clean towel to pat the incision dry following showers. Continue to use ice for pain and swelling after surgery. Do not use any lotions or creams on the incision until instructed by your surgeon.  _____________________________________________________________________________________________________________________________  Information on my medicine - XARELTO (Rivaroxaban)   Why was Xarelto prescribed for you? Xarelto was prescribed for you to reduce the risk of blood clots forming after orthopedic surgery. The medical term for these abnormal blood clots is venous thromboembolism (VTE).  What do you need to know about xarelto ? Take your Xarelto ONCE DAILY at the same time every day. You may take it either with or without food.  If you have difficulty swallowing the tablet whole, you may crush it and mix in applesauce just prior to taking your dose.  Take Xarelto exactly as prescribed by your doctor and DO NOT stop taking Xarelto without talking to the doctor who prescribed the medication.  Stopping without other VTE prevention medication to take the place of Xarelto may increase your risk of developing a clot.  After discharge, you should have  regular check-up appointments with your healthcare provider that is prescribing   your Xarelto.    What do you do if you miss a dose? If you miss a dose, take it as soon as you remember on the same day then continue your regularly scheduled once daily regimen the next day. Do not take two doses of Xarelto on the same day.   Important Safety Information A possible side effect of Xarelto is bleeding. You should call your healthcare provider right away if you experience any of the following: ? Bleeding from an injury or your nose that does not stop. ? Unusual colored urine (red or dark brown) or unusual colored stools (red or black). ? Unusual bruising for unknown reasons. ? A serious fall or if you hit your head (even if there is no bleeding).  Some medicines may interact with Xarelto and might increase your risk of bleeding while on Xarelto. To help avoid this, consult your healthcare provider or pharmacist prior to using any new prescription or non-prescription medications, including herbals, vitamins, non-steroidal anti-inflammatory drugs (NSAIDs) and supplements.  This website has more information on Xarelto: www.xarelto.com.    

## 2021-03-08 NOTE — Anesthesia Procedure Notes (Signed)
Anesthesia Procedure Image    

## 2021-03-08 NOTE — Progress Notes (Signed)
Orthopedic Tech Progress Note Patient Details:  John Wilson 17-Jan-1946 [id number]  CPM Left Knee CPM Left Knee: On Left Knee Flexion (Degrees): 40 Left Knee Extension (Degrees): 10  Post Interventions Patient Tolerated: Well Instructions Provided: Care of device  [place] Pink 03/08/2021, 9:57 AM

## 2021-03-08 NOTE — Progress Notes (Signed)
Assisted Dr. Rose with left, ultrasound guided, adductor canal block. Side rails up, monitors on throughout procedure. See vital signs in flow sheet. Tolerated Procedure well.  

## 2021-03-08 NOTE — Care Plan (Signed)
Ortho Bundle Case Management Note  Patient Details  Name: Domonick Sittner MRN: [id number] Date of Birth: [birth year]  L TKA on 03-08-21 DCP:  Home with wife.  1 story home with 2 ste. DME:  RW ordered through [facility].  Has elevated toilets.   PT:  [facility].  PT eval scheduled on 03-11-21.                   DME Arranged:  Gilford Rile rolling DME Agency:  [employer]  HH Arranged:  NA [place] Agency:  NA  Additional Comments: Please contact me with any questions of if this plan should need to change.  Marianne Sofia, RN,CCM [facility]  [phone] 03/08/2021, 1:29 PM

## 2021-03-08 NOTE — Anesthesia Procedure Notes (Signed)
Anesthesia Regional Block: Adductor canal block   Pre-Anesthetic Checklist: ,, timeout performed, Correct Patient, Correct Site, Correct Laterality, Correct Procedure, Correct Position, site marked, Risks and benefits discussed,  Surgical consent,  Pre-op evaluation,  At surgeon's request and post-op pain management  Laterality: Left  Prep: chloraprep       Needles:  Injection technique: Single-shot  Needle Type: Echogenic Needle     Needle Length: 9cm      Additional Needles:   Procedures:,,,, ultrasound used (permanent image in chart),,,,  Narrative:  Start time: 03/08/2021 7:35 AM End time: 03/08/2021 7:41 AM Injection made incrementally with aspirations every 5 mL.  Performed by: Personally  Anesthesiologist: Myrtie Soman, MD  Additional Notes: Patient tolerated the procedure well without complications

## 2021-03-08 NOTE — Transfer of Care (Signed)
Immediate Anesthesia Transfer of Care Note  Patient: John Wilson  Procedure(s) Performed: Procedure(s) with comments: TOTAL KNEE ARTHROPLASTY (Left) - 37min  Patient Location: PACU  Anesthesia Type:Spinal  Level of Consciousness: awake, alert  and oriented  Airway & Oxygen Therapy: Patient Spontanous Breathing  Post-op Assessment: Report given to RN and Post -op Vital signs reviewed and stable  Post vital signs: Reviewed and stable  Last Vitals:  Vitals:   03/08/21 0742 03/08/21 0745  BP:  134/69  Pulse: (!) 45 (!) 50  Resp:  18  Temp:    SpO2: 73% 71%    Complications: No apparent anesthesia complications

## 2021-03-08 NOTE — Interval H&P Note (Signed)
History and Physical Interval Note:  03/08/2021 6:40 AM  John Wilson  has presented today for surgery, with the diagnosis of left knee osteoarthritis.  The various methods of treatment have been discussed with the patient and family. After consideration of risks, benefits and other options for treatment, the patient has consented to  Procedure(s) with comments: TOTAL KNEE ARTHROPLASTY (Left) - 47min as a surgical intervention.  The patient's history has been reviewed, patient examined, no change in status, stable for surgery.  I have reviewed the patient's chart and labs.  Questions were answered to the patient's satisfaction.     Pilar Plate Duffy Dantonio

## 2021-03-08 NOTE — Evaluation (Signed)
Physical Therapy Evaluation Patient Details Name: John Wilson MRN: [id number] DOB: Jun 16, 1946 Today's Date: 03/08/2021   History of Present Illness  Patient is 75 y.o. male s/p Lt TKA on 03/08/21 with PMH significant for OA, obesity, vertigo, HTN, HLD, CAD, DM.  Clinical Impression  John Wilson is a 75 y.o. male POD 0 s/p Lt TKA. Patient reports independence with mobility at baseline. Patient is now limited by functional impairments (see PT problem list below) and requires min assist for transfers and gait with RW. Patient was able to ambulate ~45 feet with RW and min assist. Patient instructed in exercise to facilitate ROM and circulation to manage edema and reduce risk of DVT. Patient will benefit from continued skilled PT interventions to address impairments and progress towards PLOF. Acute PT will follow to progress mobility and stair training in preparation for safe discharge home.     Follow Up Recommendations Follow surgeon's recommendation for DC plan and follow-up therapies;Outpatient PT    Equipment Recommendations  Rolling walker with 5" wheels    Recommendations for Other Services       Precautions / Restrictions Precautions Precautions: Fall Restrictions Weight Bearing Restrictions: No Other Position/Activity Restrictions: WBAT      Mobility  Bed Mobility Overal bed mobility: Needs Assistance Bed Mobility: Supine to Sit     Supine to sit: Min guard;HOB elevated     General bed mobility comments: VC's for sequencing and to use bed rail, no assist needed, pt taking extra time.    Transfers Overall transfer level: Needs assistance Equipment used: Rolling walker (2 wheeled) Transfers: Sit to/from Stand Sit to Stand: Min assist         General transfer comment: Cues for safe technique/hand placemet, assist to initiate power up, pt steady once standing.  Ambulation/Gait Ambulation/Gait assistance: Min assist Gait Distance (Feet): 45 Feet Assistive  device: Rolling walker (2 wheeled) Gait Pattern/deviations: Step-to pattern;Decreased stride length;Decreased weight shift to left Gait velocity: decr   General Gait Details: cues for safe step to pattern and position of walker. Assist to manage walker, no overt LOB noted or buckling at Lt knee.  Stairs            Wheelchair Mobility    Modified Rankin (Stroke Patients Only)       Balance Overall balance assessment: Needs assistance Sitting-balance support: Feet supported Sitting balance-Leahy Scale: Good     Standing balance support: During functional activity;Bilateral upper extremity supported Standing balance-Leahy Scale: Poor                               Pertinent Vitals/Pain Pain Assessment: 0-10 Pain Score: 3  Pain Location: Lt knee Pain Descriptors / Indicators: Aching;Sharp;Discomfort Pain Intervention(s): Limited activity within patient's tolerance;Monitored during session;Premedicated before session;Repositioned;Ice applied    Home Living Family/patient expects to be discharged to:: Private residence Living Arrangements: Spouse/significant other Available Help at Discharge: Family Type of Home: House Home Access: Stairs to enter Entrance Stairs-Rails: None Entrance Stairs-Number of Steps: 2 Home Layout: One level Home Equipment: Cane - single point;Bedside commode;Toilet riser      Prior Function Level of Independence: Independent               Hand Dominance   Dominant Hand: Right    Extremity/Trunk Assessment   Upper Extremity Assessment Upper Extremity Assessment: Overall WFL for tasks assessed    Lower Extremity Assessment Lower Extremity Assessment: LLE deficits/detail LLE Deficits / Details:  good quad activation, no extensor lag with SLR. LLE Sensation: WNL LLE Coordination: WNL    Cervical / Trunk Assessment Cervical / Trunk Assessment: Normal  Communication   Communication: No difficulties  Cognition  Arousal/Alertness: Awake/alert Behavior During Therapy: WFL for tasks assessed/performed Overall Cognitive Status: Within Functional Limits for tasks assessed                                        General Comments      Exercises Total Joint Exercises Ankle Circles/Pumps: AROM;Both;20 reps;Seated Heel Slides: AROM;Left;5 reps;Seated   Assessment/Plan    PT Assessment Patient needs continued PT services  PT Problem List Decreased strength;Decreased range of motion;Decreased mobility;Decreased balance;Decreased activity tolerance;Decreased knowledge of use of DME;Decreased safety awareness;Decreased knowledge of precautions;Pain       PT Treatment Interventions DME instruction;Gait training;Stair training;Functional mobility training;Therapeutic activities;Therapeutic exercise;Balance training;Patient/family education    PT Goals (Current goals can be found in the Care Plan section)  Acute Rehab PT Goals Patient Stated Goal: get back to hunting, gardening, and outdoor activities PT Goal Formulation: With patient Time For Goal Achievement: 03/15/21 Potential to Achieve Goals: Good    Frequency 7X/week   Barriers to discharge        Co-evaluation               AM-PAC PT "6 Clicks" Mobility  Outcome Measure Help needed turning from your back to your side while in a flat bed without using bedrails?: A Little Help needed moving from lying on your back to sitting on the side of a flat bed without using bedrails?: A Little Help needed moving to and from a bed to a chair (including a wheelchair)?: A Little Help needed standing up from a chair using your arms (e.g., wheelchair or bedside chair)?: A Little Help needed to walk in hospital room?: A Little Help needed climbing 3-5 steps with a railing? : A Little 6 Click Score: 18    End of Session Equipment Utilized During Treatment: Gait belt Activity Tolerance: Patient tolerated treatment well Patient  left: in chair;with call bell/phone within reach;with chair alarm set Nurse Communication: Mobility status PT Visit Diagnosis: Muscle weakness (generalized) (M62.81);Difficulty in walking, not elsewhere classified (R26.2)    Time: 2010-0712 PT Time Calculation (min) (ACUTE ONLY): 32 min   Charges:   PT Evaluation $PT Eval Low Complexity: 1 Low PT Treatments $Gait Training: 8-22 mins        Verner Mould, DPT Acute Rehabilitation Services Office [phone] Pager [phone]    Jacques Navy 03/08/2021, 1:46 PM

## 2021-03-08 NOTE — Anesthesia Procedure Notes (Signed)
Spinal  Patient location during procedure: OR Start time: 03/08/2021 8:17 AM Reason for block: surgical anesthesia Staffing Performed: resident/CRNA  Anesthesiologist: Myrtie Soman, MD Resident/CRNA: Gerald Leitz, CRNA Preanesthetic Checklist Completed: patient identified, IV checked, site marked, risks and benefits discussed, surgical consent, monitors and equipment checked, pre-op evaluation and timeout performed Spinal Block Patient position: sitting Prep: DuraPrep Patient monitoring: heart rate, continuous pulse ox, blood pressure and cardiac monitor Approach: midline Location: L3-4 Injection technique: single-shot Needle Needle type: Introducer and Pencan  Needle gauge: 24 G Needle length: 9 cm Assessment Sensory level: T4 Events: CSF return Additional Notes Negative paresthesia. Negative blood return. Positive free-flowing CSF. Expiration date of kit checked and confirmed. Patient tolerated procedure well, without complications.

## 2021-03-09 ENCOUNTER — Encounter (HOSPITAL_COMMUNITY): Payer: Self-pay | Admitting: Orthopedic Surgery

## 2021-03-09 DIAGNOSIS — I2510 Atherosclerotic heart disease of native coronary artery without angina pectoris: Secondary | ICD-10-CM | POA: Diagnosis not present

## 2021-03-09 DIAGNOSIS — Z955 Presence of coronary angioplasty implant and graft: Secondary | ICD-10-CM | POA: Diagnosis not present

## 2021-03-09 DIAGNOSIS — M25562 Pain in left knee: Secondary | ICD-10-CM | POA: Diagnosis not present

## 2021-03-09 DIAGNOSIS — Z87891 Personal history of nicotine dependence: Secondary | ICD-10-CM | POA: Diagnosis not present

## 2021-03-09 DIAGNOSIS — E119 Type 2 diabetes mellitus without complications: Secondary | ICD-10-CM | POA: Diagnosis not present

## 2021-03-09 DIAGNOSIS — I10 Essential (primary) hypertension: Secondary | ICD-10-CM | POA: Diagnosis not present

## 2021-03-09 DIAGNOSIS — Z96652 Presence of left artificial knee joint: Secondary | ICD-10-CM | POA: Diagnosis not present

## 2021-03-09 DIAGNOSIS — Z7984 Long term (current) use of oral hypoglycemic drugs: Secondary | ICD-10-CM | POA: Diagnosis not present

## 2021-03-09 DIAGNOSIS — M1712 Unilateral primary osteoarthritis, left knee: Secondary | ICD-10-CM | POA: Diagnosis not present

## 2021-03-09 DIAGNOSIS — Z7982 Long term (current) use of aspirin: Secondary | ICD-10-CM | POA: Diagnosis not present

## 2021-03-09 DIAGNOSIS — Z79899 Other long term (current) drug therapy: Secondary | ICD-10-CM | POA: Diagnosis not present

## 2021-03-09 LAB — CBC
HCT: 34.6 % — ABNORMAL LOW (ref 39.0–52.0)
Hemoglobin: 11.4 g/dL — ABNORMAL LOW (ref 13.0–17.0)
MCH: 30.4 pg (ref 26.0–34.0)
MCHC: 32.9 g/dL (ref 30.0–36.0)
MCV: 92.3 fL (ref 80.0–100.0)
Platelets: 112 10*3/uL — ABNORMAL LOW (ref 150–400)
RBC: 3.75 MIL/uL — ABNORMAL LOW (ref 4.22–5.81)
RDW: 14.5 % (ref 11.5–15.5)
WBC: 11.3 10*3/uL — ABNORMAL HIGH (ref 4.0–10.5)
nRBC: 0 % (ref 0.0–0.2)

## 2021-03-09 LAB — GLUCOSE, CAPILLARY
Glucose-Capillary: 163 mg/dL — ABNORMAL HIGH (ref 70–99)
Glucose-Capillary: 113 mg/dL — ABNORMAL HIGH (ref 70–99)

## 2021-03-09 LAB — BASIC METABOLIC PANEL
Anion gap: 9 (ref 5–15)
BUN: 21 mg/dL (ref 8–23)
CO2: 25 mmol/L (ref 22–32)
Calcium: 8.2 mg/dL — ABNORMAL LOW (ref 8.9–10.3)
Chloride: 103 mmol/L (ref 98–111)
Creatinine, Ser: 1.12 mg/dL (ref 0.61–1.24)
GFR, Estimated: >60 mL/min (ref >60)
Glucose, Bld: 166 mg/dL — ABNORMAL HIGH (ref 70–99)
Potassium: 3.4 mmol/L — ABNORMAL LOW (ref 3.5–5.1)
Sodium: 137 mmol/L (ref 135–145)

## 2021-03-09 MED ORDER — RIVAROXABAN 10 MG PO TABS: 10.0000 mg | ORAL_TABLET | Freq: Every day | ORAL | 0 refills | Status: DC

## 2021-03-09 MED ORDER — POTASSIUM CHLORIDE CRYS ER 20 MEQ PO TBCR
30.0000 meq | EXTENDED_RELEASE_TABLET | Freq: Once | ORAL | Status: AC
  Administered 2021-03-09: 30 meq via ORAL
  Filled 2021-03-09: qty 1

## 2021-03-09 MED ORDER — METHOCARBAMOL 500 MG PO TABS: 500.0000 mg | ORAL_TABLET | Freq: Four times a day (QID) | ORAL | 0 refills | Status: DC | PRN

## 2021-03-09 MED ORDER — SODIUM CHLORIDE 0.9 % IV BOLUS
250.0000 mL | Freq: Once | INTRAVENOUS | Status: AC
  Administered 2021-03-09: 250 mL via INTRAVENOUS

## 2021-03-09 MED ORDER — TRAMADOL HCL 50 MG PO TABS: 50.0000 mg | ORAL_TABLET | Freq: Four times a day (QID) | ORAL | 0 refills | Status: DC | PRN

## 2021-03-09 MED ORDER — GABAPENTIN 300 MG PO CAPS: ORAL_CAPSULE | ORAL | 0 refills | Status: DC

## 2021-03-09 MED ORDER — OXYCODONE HCL 5 MG PO TABS: 5.0000 mg | ORAL_TABLET | Freq: Four times a day (QID) | ORAL | 0 refills | Status: DC | PRN

## 2021-03-09 NOTE — Progress Notes (Signed)
Physical Therapy Treatment Patient Details Name: John Wilson MRN: [id number] DOB: 1946-01-25 Today's Date: 03/09/2021    History of Present Illness Patient is 75 y.o. male s/p Lt TKA on 03/08/21 with PMH significant for OA, obesity, vertigo, HTN, HLD, CAD, DM.    PT Comments    Pt ambulated 170' with RW, no loss of balance. Stair training completed.  Pt demonstrates good understanding of HEP. He is ready to DC home from PT standpoint.   Follow Up Recommendations  Follow surgeon's recommendation for DC plan and follow-up therapies;Outpatient PT     Equipment Recommendations  Rolling walker with 5" wheels    Recommendations for Other Services       Precautions / Restrictions Precautions Precautions: Fall;Knee Precaution Booklet Issued: Yes (comment) Precaution Comments: reviewed no pillow under knee Restrictions Weight Bearing Restrictions: No Other Position/Activity Restrictions: WBAT    Mobility  Bed Mobility Overal bed mobility: Modified Independent                  Transfers Overall transfer level: Needs assistance Equipment used: Rolling walker (2 wheeled) Transfers: Sit to/from Stand Sit to Stand: Supervision         General transfer comment: VCs hand placement  Ambulation/Gait Ambulation/Gait assistance: Modified independent (Device/Increase time) Gait Distance (Feet): 170 Feet Assistive device: Rolling walker (2 wheeled) Gait Pattern/deviations: Step-to pattern;Decreased stride length Gait velocity: decr   General Gait Details: good sequencing, no loss of balance   Stairs Stairs: Yes Stairs assistance: Min assist Stair Management: No rails;Step to pattern;Backwards Number of Stairs: 2 General stair comments: VCs sequencing, min A to manage RW   Wheelchair Mobility    Modified Rankin (Stroke Patients Only)       Balance Overall balance assessment: Needs assistance Sitting-balance support: Feet supported Sitting balance-Leahy  Scale: Good     Standing balance support: During functional activity;Bilateral upper extremity supported Standing balance-Leahy Scale: Poor                              Cognition Arousal/Alertness: Awake/alert Behavior During Therapy: WFL for tasks assessed/performed Overall Cognitive Status: Within Functional Limits for tasks assessed                                        Exercises Total Joint Exercises Ankle Circles/Pumps: AROM;Both;20 reps;Seated Quad Sets: AROM;Left;5 reps;Supine Short Arc Quad: AROM;Left;10 reps;Supine Heel Slides: AROM;Left;10 reps;Supine Hip ABduction/ADduction: Left;10 reps;AROM;Supine Straight Leg Raises: AROM;Left;5 reps;Supine Long Arc Quad: AROM;Left;10 reps;Seated Knee Flexion: AAROM;Left;10 reps;Seated Goniometric ROM: 0-80* L knee    General Comments        Pertinent Vitals/Pain Pain Score: 4  Pain Location: Lt knee Pain Descriptors / Indicators: Aching;Sharp;Discomfort Pain Intervention(s): Limited activity within patient's tolerance;Monitored during session;Premedicated before session;Ice applied    Home Living                      Prior Function            PT Goals (current goals can now be found in the care plan section) Acute Rehab PT Goals Patient Stated Goal: get back to hunting, gardening, and outdoor activities PT Goal Formulation: With patient Time For Goal Achievement: 03/15/21 Potential to Achieve Goals: Good Progress towards PT goals: Goals met/education completed, patient discharged from PT    Frequency    7X/week  PT Plan Current plan remains appropriate    Co-evaluation              AM-PAC PT "6 Clicks" Mobility   Outcome Measure  Help needed turning from your back to your side while in a flat bed without using bedrails?: None Help needed moving from lying on your back to sitting on the side of a flat bed without using bedrails?: None Help needed moving to  and from a bed to a chair (including a wheelchair)?: None Help needed standing up from a chair using your arms (e.g., wheelchair or bedside chair)?: None Help needed to walk in hospital room?: None Help needed climbing 3-5 steps with a railing? : A Little 6 Click Score: 23    End of Session Equipment Utilized During Treatment: Gait belt Activity Tolerance: Patient tolerated treatment well Patient left: in chair;with call bell/phone within reach;with chair alarm set Nurse Communication: Mobility status PT Visit Diagnosis: Muscle weakness (generalized) (M62.81);Difficulty in walking, not elsewhere classified (R26.2)     Time: 3524-8185 PT Time Calculation (min) (ACUTE ONLY): 34 min  Charges:  $Gait Training: 8-22 mins $Therapeutic Exercise: 8-22 mins                     Blondell Reveal Kistler PT 03/09/2021  Acute Rehabilitation Services Pager [phone] Office [phone]

## 2021-03-09 NOTE — Progress Notes (Signed)
   Subjective: 1 Day Post-Op Procedure(s) (LRB): TOTAL KNEE ARTHROPLASTY (Left) Patient reports pain as mild.   Patient seen in rounds by Dr. Wynelle Link. Patient is well, and has had no acute complaints or problems. No issues overnight, denies chest pain, SOB, or calf pain. Foley catheter to be removed this AM.  We will continue therapy today, ambulated 37' yesterday.  Objective: Vital signs in last 24 hours: Temp:  [96.3 F (35.7 C)-98.7 F (37.1 C)] 98.3 F (36.8 C) (04/26 0159) Pulse Rate:  [42-55] 53 (04/26 0159) Resp:  [12-23] 16 (04/26 0159) BP: (95-143)/(57-99) 95/67 (04/26 0159) SpO2:  [90 %-100 %] 97 % (04/26 0159)  Intake/Output from previous day:  Intake/Output Summary (Last 24 hours) at 03/09/2021 0711 Last data filed at 03/09/2021 0600 Gross per 24 hour  Intake 4426.23 ml  Output 1485 ml  Net 2941.23 ml     Intake/Output this shift: No intake/output data recorded.  Labs: Recent Labs    03/09/21 0320  HGB 11.4*   Recent Labs    03/09/21 0320  WBC 11.3*  RBC 3.75*  HCT 34.6*  PLT 112*   Recent Labs    03/09/21 0320  NA 137  K 3.4*  CL 103  CO2 25  BUN 21  CREATININE 1.12  GLUCOSE 166*  CALCIUM 8.2*   No results for input(s): LABPT, INR in the last 72 hours.  Exam: General - Patient is Alert and Oriented Extremity - Neurologically intact Neurovascular intact Sensation intact distally Dorsiflexion/Plantar flexion intact Dressing - dressing C/D/I Motor Function - intact, moving foot and toes well on exam.   Past Medical History:  Diagnosis Date  . Arthritis   . CAD (coronary artery disease)   . Diabetes ([place])   . ED (erectile dysfunction)   . Gout   . Heart murmur   . Hyperlipidemia   . Hypertension   . Obesity   . Vertigo     Assessment/Plan: 1 Day Post-Op Procedure(s) (LRB): TOTAL KNEE ARTHROPLASTY (Left) Principal Problem:   OA (osteoarthritis) of knee Active Problems:   Primary osteoarthritis of left knee  Estimated  body mass index is 36.16 kg/m as calculated from the following:   Height as of this encounter: 5\' 10"  (1.778 m).   Weight as of this encounter: 114.3 kg. Advance diet Up with therapy D/C IV fluids   Patient's anticipated LOS is less than 2 midnights, meeting these requirements: - Younger than 38 - Lives within 1 hour of care - Has a competent adult at home to recover with post-op recover - NO history of  - Chronic pain requiring opiods  - Diabetes  - Coronary Artery Disease  - Heart failure  - Heart attack  - Stroke  - DVT/VTE  - Cardiac arrhythmia  - Respiratory Failure/COPD  - Renal failure  - Anemia  - Advanced Liver disease  DVT Prophylaxis - Xarelto Weight bearing as tolerated. Continue therapy.  BP soft this AM, 250 mL bolus ordered. Potassium 3.4, one dose of 30 mEq KCl ordered.  Plan is to go Home after hospital stay. Plan for discharge later today if cleared by PT with OPPT. Follow-up in the office in 2 weeks  The PDMP database was reviewed today prior to any opioid medications being prescribed to this patient.   Theresa Duty, PA-C Orthopedic Surgery [phone] 03/09/2021, 7:11 AM

## 2021-03-09 NOTE — TOC Transition Note (Signed)
Transition of Care [facility]) - CM/SW Discharge Note   Patient Details  Name: John Wilson MRN: [id number] Date of Birth: [birth year]  [facility] CM/SW Contact:  Lennart Pall, LCSW Phone Number: 03/09/2021, 11:26 AM   Clinical Narrative:    Met briefly with pt and confirmed receipt of rw via [employer].  Plan for OPPT at [facility] in [place].  No further TOC needs.   Final next level of care: OP Rehab Barriers to Discharge: No Barriers Identified   Patient Goals and CMS Choice Patient states their goals for this hospitalization and ongoing recovery are:: return home      Discharge Placement                       Discharge Plan and Services                DME Arranged: Walker rolling DME Agency: [employer]       HH Arranged: NA [place] Agency: NA        Social Determinants of Health (SDOH) Interventions     Readmission Risk Interventions No flowsheet data found.

## 2021-03-10 NOTE — Anesthesia Postprocedure Evaluation (Signed)
Anesthesia Post Note  Patient: John Wilson  Procedure(s) Performed: TOTAL KNEE ARTHROPLASTY (Left Knee)     Patient location during evaluation: PACU Anesthesia Type: Spinal Level of consciousness: oriented and awake and alert Pain management: pain level controlled Vital Signs Assessment: post-procedure vital signs reviewed and stable Respiratory status: spontaneous breathing, respiratory function stable and patient connected to nasal cannula oxygen Cardiovascular status: blood pressure returned to baseline and stable Postop Assessment: no headache, no backache and no apparent nausea or vomiting Anesthetic complications: no   No complications documented.  Last Vitals:  Vitals:   03/09/21 0913 03/09/21 1208  BP: 133/74 (!) 142/68  Pulse: (!) 48 (!) 49  Resp: 18   Temp: 36.7 C   SpO2: 95% 96%    Last Pain:  Vitals:   03/09/21 1211  TempSrc:   PainSc: 6                  Nzinga Ferran S

## 2021-03-11 ENCOUNTER — Encounter: Payer: Self-pay | Admitting: Physical Therapy

## 2021-03-11 ENCOUNTER — Ambulatory Visit: Payer: Medicare HMO | Attending: Orthopedic Surgery | Admitting: Physical Therapy

## 2021-03-11 ENCOUNTER — Other Ambulatory Visit: Payer: Self-pay

## 2021-03-11 DIAGNOSIS — M25562 Pain in left knee: Secondary | ICD-10-CM | POA: Diagnosis not present

## 2021-03-11 DIAGNOSIS — G8929 Other chronic pain: Secondary | ICD-10-CM | POA: Diagnosis not present

## 2021-03-11 DIAGNOSIS — R600 Localized edema: Secondary | ICD-10-CM

## 2021-03-11 DIAGNOSIS — M6281 Muscle weakness (generalized): Secondary | ICD-10-CM

## 2021-03-11 DIAGNOSIS — M25662 Stiffness of left knee, not elsewhere classified: Secondary | ICD-10-CM | POA: Diagnosis not present

## 2021-03-11 NOTE — Therapy (Signed)
[facility] [place], [place], [address] Phone: [phone]   Fax:  [phone]  Physical Therapy Treatment  Patient Details  Name: John Wilson MRN: [id number] Date of Birth: 1946/11/04 Referring Provider (PT): Gaynelle Arabian MD   Encounter Date: 03/11/2021   PT End of Session - 03/11/21 1123    Visit Number 1    Number of Visits 12    Date for PT Re-Evaluation 04/08/21    Authorization Type FOTO AT LEAST EVERY 5TH VISIT.  PROGRESS NOTE AT 10TH VISIT.  KX MODIFIER AFTER 15 VISITS.    PT Start Time 0815    PT Stop Time 0909    PT Time Calculation (min) 54 min    Activity Tolerance Patient tolerated treatment well    Behavior During Therapy [facility] for tasks assessed/performed           Past Medical History:  Diagnosis Date  . Arthritis   . CAD (coronary artery disease)   . Diabetes ([place])   . ED (erectile dysfunction)   . Gout   . Heart murmur   . Hyperlipidemia   . Hypertension   . Obesity   . Vertigo     Past Surgical History:  Procedure Laterality Date  . CHOLECYSTECTOMY  1981  . CORONARY ANGIOPLASTY WITH STENT PLACEMENT  2001   2 separate surgeries, 1 month apart  . HERNIA REPAIR  3295   Umbilical  . LEFT HEART CATH AND CORONARY ANGIOGRAPHY N/A 03/16/2020   Procedure: LEFT HEART CATH AND CORONARY ANGIOGRAPHY;  Surgeon: Troy Sine, MD;  Location: [facility];  Service: Cardiovascular;  Laterality: N/A;  . PILONIDAL CYST EXCISION  1979  . TOTAL KNEE ARTHROPLASTY Left 03/08/2021   Procedure: TOTAL KNEE ARTHROPLASTY;  Surgeon: Gaynelle Arabian, MD;  Location: [facility];  Service: Orthopedics;  Laterality: Left;  54min    There were no vitals filed for this visit.   Subjective Assessment - 03/11/21 1728    Subjective COVID-19 screen performed prior to patient entering clinic. The patient presents to the clinic today s/p left total knee replacement performed on 03/08/21.  He states he has been compliant to  his HEP.  He is currently walking wih a FWW.  He is compliant to using his TED hoses.  His pain-level today is an 8/10.  Rest and ice decreases his pain and increased up time increases his pain.    Patient is accompained by: Family member   Wife present.   Pertinent History Coronary angioplasty with stent placement, OA, CAD, DM, HTN, hernia repair, h/o LBP.    How long can you walk comfortably? Around house with a FWW.    Patient Stated Goals Perform ADL's without left knee pain.    Currently in Pain? Yes    Pain Score 8     Pain Location Knee    Pain Orientation Left    Pain Descriptors / Indicators Sharp;Aching    Pain Type Surgical pain    Pain Onset More than a month ago    Pain Frequency Constant    Aggravating Factors  See above.    Pain Relieving Factors See above.              [facility] PT Assessment - 03/11/21 0001      Assessment   Medical Diagnosis Left total knee replacement.    Referring Provider (PT) Gaynelle Arabian MD    Onset Date/Surgical Date --   03/08/21 (surgery date).  Precautions   Precaution Comments No ultrasound.      Restrictions   Weight Bearing Restrictions No      Balance Screen   Has the patient fallen in the past 6 months No    Has the patient had a decrease in activity level because of a fear of falling?  Yes    Is the patient reluctant to leave their home because of a fear of falling?  No      Home Environment   Living Environment Private residence      Prior Function   Level of Independence Independent      Observation/Other Assessments   Observations Aquacel intact over left anterior knee.  TED hoses are donned.      Observation/Other Assessments-Edema    Edema Circumferential      Circumferential Edema   Circumferential - Right LT 8 cms > RT.      ROM / Strength   AROM / PROM / Strength AROM;Strength      AROM   Overall AROM Comments Left knee active extension is -20 degrees and passively -15 degrees with active flexion to 80  degrees and passively to 85 degrees.      Strength   Overall Strength Comments Left hip flexion is 4/5 and left knee extension is 4/5.      Palpation   Palpation comment C/o diffuse anterior left knee pain and some reported along his left distal ITB.      Ambulation/Gait   Gait Comments Safe ambulation with a FWW.                         [facility] Adult PT Treatment/Exercise - 03/11/21 0001      Exercises   Exercises Knee/Hip      Knee/Hip Exercises: Aerobic   Nustep Level 2 x 8 minutes.      Modalities   Modalities Vasopneumatic      Vasopneumatic   Number Minutes Vasopneumatic  15 minutes    Vasopnuematic Location  --   Left knee.   Vasopneumatic Pressure Low                       PT Long Term Goals - 03/11/21 1748      PT LONG TERM GOAL #1   Title Independent with a HEP.    Time 4    Period Weeks    Status New      PT LONG TERM GOAL #2   Title Full active left knee extension in order to normalize gait.    Time 4    Period Weeks    Status New      PT LONG TERM GOAL #3   Title Active knee flexion to 115 degrees+ so the patient can perform functional tasks and do so with pain not > 2-3/10.    Time 4    Period Weeks    Status New      PT LONG TERM GOAL #4   Title Decrease edema to within 2.5 cms of non-affected side to assist with pain reduction and range of motion gains.    Time 4    Period Weeks    Status New      PT LONG TERM GOAL #5   Title Increase right hip and knee strength to a solid 4+/5 to provide good stability for accomplishment of functional activities.    Time 4    Period Weeks  Status New      PT LONG TERM GOAL #6   Title Perform a reciprocating stair gait with one railing with pain not > 2-3/10.    Time 4    Period Weeks    Status New                 Plan - 03/11/21 1123    Clinical Impression Statement The patient presents to OPPT s/p left total knee replacement performed on 03/08/21.  He is walking  safely with a FWW.  He left knee is remarkable for a significant amount of edema.  He is currently lacking left knee flexion and extension and some associated strength losses.  He is compliant to his HEP and TED hose usage.  Patient will benefit from skilled physical therapy intervention to address pain and deficits.    Personal Factors and Comorbidities Comorbidity 1;Comorbidity 3+;Comorbidity 2;Other    Comorbidities Coronary angioplasty with stent placement, OA, CAD, DM, HTN, hernia repair, h/o LBP.    Examination-Activity Limitations Other;Locomotion Level;Stairs    Examination-Participation Restrictions Other    Stability/Clinical Decision Making Stable/Uncomplicated    Clinical Decision Making Low    Rehab Potential Excellent    PT Frequency 3x / week    PT Duration 4 weeks    PT Treatment/Interventions ADLs/Self Care Home Management;Cryotherapy;Electrical Stimulation;Gait training;Stair training;Functional mobility training;Therapeutic activities;Therapeutic exercise;Neuromuscular re-education;Manual techniques;Patient/family education;Passive range of motion;Vasopneumatic Device    PT Next Visit Plan FOTO...Marland KitchenMarland KitchenNustep, left knee PROM, VMS to left quadriceps, progress per TKA protocol.  Vaso/E'Stim.           Patient will benefit from skilled therapeutic intervention in order to improve the following deficits and impairments:  Pain,Abnormal gait,Decreased activity tolerance,Decreased range of motion,Decreased strength,Increased edema  Visit Diagnosis: Chronic pain of left knee  Stiffness of left knee, not elsewhere classified  Localized edema  Muscle weakness (generalized)     Problem List Patient Active Problem List   Diagnosis Date Noted  . OA (osteoarthritis) of knee 03/08/2021  . Primary osteoarthritis of left knee 03/08/2021  . Abdominal aortic ectasia ([place]) 12/31/2020  . Constipation 12/31/2020  . Idiopathic gout 12/31/2020  . Mixed hyperlipidemia 12/31/2020  .  Morbid obesity ([place]) 12/31/2020  . Personal history of colonic polyps 12/31/2020  . Polyneuropathy in diseases classified elsewhere ([place]) 12/31/2020  . Type 2 diabetes mellitus with other circulatory complications ([place]) 61/95/0932  . Diabetes mellitus with coincident hypertension ([place]) 09/16/2019  . Coronary artery disease involving native coronary artery of native heart without angina pectoris 09/16/2019  . Pure hypercholesterolemia 09/16/2019  . Incarcerated umbilical hernia 67/10/4579    Gordan Grell, [name] 03/11/2021, 5:52 PM  [facility] [place], [place], [address] Phone: [phone]   Fax:  [phone]  Name: Keshawn Fiorito MRN: [id number] Date of Birth: Oct 17, 1946

## 2021-03-12 ENCOUNTER — Encounter: Payer: Self-pay | Admitting: Physical Therapy

## 2021-03-12 ENCOUNTER — Ambulatory Visit: Payer: Medicare HMO | Admitting: Physical Therapy

## 2021-03-12 ENCOUNTER — Other Ambulatory Visit: Payer: Self-pay

## 2021-03-12 DIAGNOSIS — M25562 Pain in left knee: Secondary | ICD-10-CM | POA: Diagnosis not present

## 2021-03-12 DIAGNOSIS — G8929 Other chronic pain: Secondary | ICD-10-CM | POA: Diagnosis not present

## 2021-03-12 DIAGNOSIS — R600 Localized edema: Secondary | ICD-10-CM

## 2021-03-12 DIAGNOSIS — M6281 Muscle weakness (generalized): Secondary | ICD-10-CM

## 2021-03-12 DIAGNOSIS — M25662 Stiffness of left knee, not elsewhere classified: Secondary | ICD-10-CM

## 2021-03-12 NOTE — Therapy (Signed)
[facility] [place], [place], [address] Phone: [phone]   Fax:  [phone]  Physical Therapy Treatment  Patient Details  Name: John Wilson MRN: [id number] Date of Birth: 1946-08-23 Referring Provider (PT): John Arabian MD   Encounter Date: 03/12/2021   PT End of Session - 03/12/21 0825    Visit Number 2    Number of Visits 12    Date for PT Re-Evaluation 04/08/21    Authorization Type FOTO AT LEAST EVERY 5TH VISIT.  PROGRESS NOTE AT 10TH VISIT.  KX MODIFIER AFTER 15 VISITS.    PT Start Time 0820    PT Stop Time 0907    PT Time Calculation (min) 47 min    Equipment Utilized During Treatment Other (comment)   FWW   Activity Tolerance Patient tolerated treatment well    Behavior During Therapy [facility] for tasks assessed/performed           Past Medical History:  Diagnosis Date  . Arthritis   . CAD (coronary artery disease)   . Diabetes ([place])   . ED (erectile dysfunction)   . Gout   . Heart murmur   . Hyperlipidemia   . Hypertension   . Obesity   . Vertigo     Past Surgical History:  Procedure Laterality Date  . CHOLECYSTECTOMY  1981  . CORONARY ANGIOPLASTY WITH STENT PLACEMENT  2001   2 separate surgeries, 1 month apart  . HERNIA REPAIR  3419   Umbilical  . LEFT HEART CATH AND CORONARY ANGIOGRAPHY N/A 03/16/2020   Procedure: LEFT HEART CATH AND CORONARY ANGIOGRAPHY;  Surgeon: Troy Sine, MD;  Location: [facility];  Service: Cardiovascular;  Laterality: N/A;  . PILONIDAL CYST EXCISION  1979  . TOTAL KNEE ARTHROPLASTY Left 03/08/2021   Procedure: TOTAL KNEE ARTHROPLASTY;  Surgeon: John Arabian, MD;  Location: [facility];  Service: Orthopedics;  Laterality: Left;  15min    There were no vitals filed for this visit.   Subjective Assessment - 03/12/21 0823    Subjective COVID-19 screen performed prior to patient entering clinic. Reports more temperature and swelling of LLE. Patient reports he has  gained 9 lbs since surgery.    Patient is accompained by: Family member   Wife, John Wilson   Pertinent History Coronary angioplasty with stent placement, OA, CAD, DM, HTN, hernia repair, h/o LBP.    How long can you walk comfortably? Around house with a FWW.    Patient Stated Goals Perform ADL's without left knee pain.    Currently in Pain? Yes    Pain Score 6     Pain Location Knee    Pain Orientation Left    Pain Descriptors / Indicators Throbbing;Discomfort    Pain Type Surgical pain    Pain Onset In the past 7 days    Pain Frequency Constant              [facility] PT Assessment - 03/12/21 0001      Assessment   Medical Diagnosis Left total knee replacement.    Referring Provider (PT) John Arabian MD    Onset Date/Surgical Date 03/08/21    Hand Dominance Right    Next MD Visit 03/23/2021      Precautions   Precaution Comments No ultrasound.      Restrictions   Weight Bearing Restrictions No      Observation/Other Assessments   Focus on Therapeutic Outcomes (FOTO)  64% limitation 2nd visit 03/12/2021  ROM / Strength   AROM / PROM / Strength AROM      AROM   Overall AROM  Deficits    AROM Assessment Site Knee    Right/Left Knee Left    Left Knee Extension 11    Left Knee Flexion 80                         [facility] Adult PT Treatment/Exercise - 03/12/21 0001      Knee/Hip Exercises: Aerobic   Nustep L1, seat 12-10 x16 min for ROM      Knee/Hip Exercises: Standing   Hip Flexion AROM;Left;2 sets;10 reps    Forward Lunges Left;2 sets;10 reps;2 seconds    Forward Lunges Limitations 6" step    Rocker Board 3 minutes      Knee/Hip Exercises: Supine   Short Arc Quad Sets Left;2 sets;10 reps    Short Arc Quad Sets Limitations 5 sec holds    Heel Prop for Knee Extension 5 minutes      Modalities   Modalities Vasopneumatic      Vasopneumatic   Number Minutes Vasopneumatic  15 minutes    Vasopnuematic Location  Knee    Vasopneumatic Pressure Medium     Vasopneumatic Temperature  34/pain and edema                       PT Long Term Goals - 03/11/21 1748      PT LONG TERM GOAL #1   Title Independent with a HEP.    Time 4    Period Weeks    Status New      PT LONG TERM GOAL #2   Title Full active left knee extension in order to normalize gait.    Time 4    Period Weeks    Status New      PT LONG TERM GOAL #3   Title Active knee flexion to 115 degrees+ so the patient can perform functional tasks and do so with pain not > 2-3/10.    Time 4    Period Weeks    Status New      PT LONG TERM GOAL #4   Title Decrease edema to within 2.5 cms of non-affected side to assist with pain reduction and range of motion gains.    Time 4    Period Weeks    Status New      PT LONG TERM GOAL #5   Title Increase right hip and knee strength to a solid 4+/5 to provide good stability for accomplishment of functional activities.    Time 4    Period Weeks    Status New      PT LONG TERM GOAL #6   Title Perform a reciprocating stair gait with one railing with pain not > 2-3/10.    Time 4    Period Weeks    Status New                 Plan - 03/12/21 1039    Clinical Impression Statement Patient presented in clinic with reports of increased pain and soreness after beginning PT. Patient and his wife both state that patient is very compliant with HEP and mobility. Patient's L knee has postsurgical swelling in L knee as well as into L calf. Temperature noted as well in L knee. Patient and wife educated that over the weekend to maintain elevation of LLE and ice accordingly but  to monitor redness and temperature to call on call nursing for further evaluation. Patient progressed to low level stretching and strengthening with fairly good quad activation noted. AROM of L knee measured as 11-80 deg 4 days postop. Patient's wife educated on best placement of pillow under L calf and ankle for stretch and to avoid contracture. Normal  vasopneumatic response noted following removal of the modality.    Personal Factors and Comorbidities Comorbidity 1;Comorbidity 3+;Comorbidity 2;Other    Comorbidities Coronary angioplasty with stent placement, OA, CAD, DM, HTN, hernia repair, h/o LBP.    Examination-Activity Limitations Other;Locomotion Level;Stairs    Examination-Participation Restrictions Other    Stability/Clinical Decision Making Stable/Uncomplicated    Rehab Potential Excellent    PT Frequency 3x / week    PT Duration 4 weeks    PT Treatment/Interventions ADLs/Self Care Home Management;Cryotherapy;Electrical Stimulation;Gait training;Stair training;Functional mobility training;Therapeutic activities;Therapeutic exercise;Neuromuscular re-education;Manual techniques;Patient/family education;Passive range of motion;Vasopneumatic Device    PT Next Visit Plan Nustep, left knee PROM, VMS to left quadriceps, progress per TKA protocol.  Vaso/E'Stim.    Consulted and Agree with Plan of Care Patient;Family member/caregiver    Family Member Consulted Wife, Noreene Larsson           Patient will benefit from skilled therapeutic intervention in order to improve the following deficits and impairments:  Pain,Abnormal gait,Decreased activity tolerance,Decreased range of motion,Decreased strength,Increased edema  Visit Diagnosis: Chronic pain of left knee  Stiffness of left knee, not elsewhere classified  Localized edema  Muscle weakness (generalized)     Problem List Patient Active Problem List   Diagnosis Date Noted  . OA (osteoarthritis) of knee 03/08/2021  . Primary osteoarthritis of left knee 03/08/2021  . Abdominal aortic ectasia ([place]) 12/31/2020  . Constipation 12/31/2020  . Idiopathic gout 12/31/2020  . Mixed hyperlipidemia 12/31/2020  . Morbid obesity ([place]) 12/31/2020  . Personal history of colonic polyps 12/31/2020  . Polyneuropathy in diseases classified elsewhere ([place]) 12/31/2020  . Type 2 diabetes mellitus with  other circulatory complications ([place]) 35/45/6256  . Diabetes mellitus with coincident hypertension ([place]) 09/16/2019  . Coronary artery disease involving native coronary artery of native heart without angina pectoris 09/16/2019  . Pure hypercholesterolemia 09/16/2019  . Incarcerated umbilical hernia 38/93/7342    Standley Brooking, PTA 03/12/2021, 10:44 AM  [facility] [place], [place], [address] Phone: [phone]   Fax:  [phone]  Name: John Wilson MRN: [id number] Date of Birth: [birth year]

## 2021-03-15 NOTE — Discharge Summary (Signed)
Physician Discharge Summary   Patient ID: John Wilson MRN: [id number] DOB/AGE: Apr 06, 1946 75 y.o.  Admit date: 03/08/2021 Discharge date: 03/09/2021  Primary Diagnosis: Osteoarthritis, left knee   Admission Diagnoses:  Past Medical History:  Diagnosis Date  . Arthritis   . CAD (coronary artery disease)   . Diabetes ([place])   . ED (erectile dysfunction)   . Gout   . Heart murmur   . Hyperlipidemia   . Hypertension   . Obesity   . Vertigo    Discharge Diagnoses:   Principal Problem:   OA (osteoarthritis) of knee Active Problems:   Primary osteoarthritis of left knee  Estimated body mass index is 36.16 kg/m as calculated from the following:   Height as of this encounter: 5\' 10"  (1.778 m).   Weight as of this encounter: 114.3 kg.  Procedure:  Procedure(s) (LRB): TOTAL KNEE ARTHROPLASTY (Left)   Consults: None  HPI: John Wilson is a 75 y.o. year old male with end stage OA of his left knee with progressively worsening pain and dysfunction. He has constant pain, with activity and at rest and significant functional deficits with difficulties even with ADLs. He has had extensive non-op management including analgesics, injections of cortisone and viscosupplements, and home exercise program, but remains in significant pain with significant dysfunction. Radiographs show bone on bone arthritis medial and patellofemoral. He presents now for left Total Knee Arthroplasty.     Laboratory Data: Admission on 03/08/2021, Discharged on 03/09/2021  Component Date Value Ref Range Status  . ABO/RH(D) 03/08/2021    Final                   Value:A NEG Performed at [facility], [place] [address], [place], [place] [address]   . Glucose-Capillary 03/08/2021 134* 70 - 99 mg/dL Final   Glucose reference range applies only to samples taken after fasting for at least 8 hours.  . Comment 1 03/08/2021 Notify RN   Final  . Comment 2 03/08/2021 Document in Chart   Final  .  Glucose-Capillary 03/08/2021 133* 70 - 99 mg/dL Final   Glucose reference range applies only to samples taken after fasting for at least 8 hours.  . Glucose-Capillary 03/08/2021 259* 70 - 99 mg/dL Final   Glucose reference range applies only to samples taken after fasting for at least 8 hours.  . WBC 03/09/2021 11.3* 4.0 - 10.5 K/uL Final  . RBC 03/09/2021 3.75* 4.22 - 5.81 MIL/uL Final  . Hemoglobin 03/09/2021 11.4* 13.0 - 17.0 g/dL Final  . HCT 03/09/2021 34.6* 39.0 - 52.0 % Final  . MCV 03/09/2021 92.3  80.0 - 100.0 fL Final  . MCH 03/09/2021 30.4  26.0 - 34.0 pg Final  . MCHC 03/09/2021 32.9  30.0 - 36.0 g/dL Final  . RDW 03/09/2021 14.5  11.5 - 15.5 % Final  . Platelets 03/09/2021 112* 150 - 400 K/uL Final   Comment: SPECIMEN CHECKED FOR CLOTS Immature Platelet Fraction may be clinically indicated, consider ordering this additional test [id number] PLATELET COUNT CONFIRMED BY SMEAR   . nRBC 03/09/2021 0.0  0.0 - 0.2 % Final   Performed at [facility] [place] [address], [place], [place] [address]  . Sodium 03/09/2021 137  135 - 145 mmol/L Final  . Potassium 03/09/2021 3.4* 3.5 - 5.1 mmol/L Final  . Chloride 03/09/2021 103  98 - 111 mmol/L Final  . CO2 03/09/2021 25  22 - 32 mmol/L Final  . Glucose, Bld 03/09/2021 166* 70 -  99 mg/dL Final   Glucose reference range applies only to samples taken after fasting for at least 8 hours.  . BUN 03/09/2021 21  8 - 23 mg/dL Final  . Creatinine, Ser 03/09/2021 1.12  0.61 - 1.24 mg/dL Final  . Calcium 03/09/2021 8.2* 8.9 - 10.3 mg/dL Final  . GFR, Estimated 03/09/2021 >60  >60 mL/min Final   Comment: (NOTE) Calculated using the CKD-EPI Creatinine Equation (2021)   . Anion gap 03/09/2021 9  5 - 15 Final   Performed at [facility], [place] [address] [place], [place] [address]  . Glucose-Capillary 03/08/2021 244* 70 - 99 mg/dL Final   Glucose reference range applies only to samples taken after fasting for at  least 8 hours.  . Glucose-Capillary 03/09/2021 163* 70 - 99 mg/dL Final   Glucose reference range applies only to samples taken after fasting for at least 8 hours.  . Glucose-Capillary 03/09/2021 113* 70 - 99 mg/dL Final   Glucose reference range applies only to samples taken after fasting for at least 8 hours.  Hospital Outpatient Visit on 03/04/2021  Component Date Value Ref Range Status  . SARS Coronavirus 2 03/04/2021 NEGATIVE  NEGATIVE Final   Comment: (NOTE) SARS-CoV-2 target nucleic acids are NOT DETECTED.  The SARS-CoV-2 RNA is generally detectable in upper and lower respiratory specimens during the acute phase of infection. Negative results do not preclude SARS-CoV-2 infection, do not rule out co-infections with other pathogens, and should not be used as the sole basis for treatment or other patient management decisions. Negative results must be combined with clinical observations, patient history, and epidemiological information. The expected result is Negative.  Fact Sheet for Patients: SugarRoll.be  Fact Sheet for Healthcare Providers: https://www.woods-mathews.com/  This test is not yet approved or cleared by the [place] FDA and  has been authorized for detection and/or diagnosis of SARS-CoV-2 by FDA under an Emergency Use Authorization (EUA). This EUA will remain  in effect (meaning this test can be used) for the duration of the COVID-19 declaration under Se                          ction 564(b)(1) of the Act, 21 U.S.C. section 360bbb-3(b)(1), unless the authorization is terminated or revoked sooner.  Performed at [facility], [place] [address], [place], [place] [address]   Hospital Outpatient Visit on 02/24/2021  Component Date Value Ref Range Status  . MRSA, PCR 02/24/2021 NEGATIVE  NEGATIVE Final  . Staphylococcus aureus 02/24/2021 NEGATIVE  NEGATIVE Final   Comment: (NOTE) The Xpert SA Assay (FDA approved  for NASAL specimens in patients 37 years of age and older), is one component of a comprehensive surveillance program. It is not intended to diagnose infection nor to guide or monitor treatment. Performed at [facility], [place] [address], [place], [place] [address]   . WBC 02/24/2021 7.8  4.0 - 10.5 K/uL Final  . RBC 02/24/2021 4.98  4.22 - 5.81 MIL/uL Final  . Hemoglobin 02/24/2021 14.9  13.0 - 17.0 g/dL Final  . HCT 02/24/2021 45.1  39.0 - 52.0 % Final  . MCV 02/24/2021 90.6  80.0 - 100.0 fL Final  . MCH 02/24/2021 29.9  26.0 - 34.0 pg Final  . MCHC 02/24/2021 33.0  30.0 - 36.0 g/dL Final  . RDW 02/24/2021 14.6  11.5 - 15.5 % Final  . Platelets 02/24/2021 172  150 - 400 K/uL Final  . nRBC  02/24/2021 0.0  0.0 - 0.2 % Final   Performed at [place] [address], [place], [place] [address]  . Sodium 02/24/2021 138  135 - 145 mmol/L Final  . Potassium 02/24/2021 3.4* 3.5 - 5.1 mmol/L Final  . Chloride 02/24/2021 101  98 - 111 mmol/L Final  . CO2 02/24/2021 29  22 - 32 mmol/L Final  . Glucose, Bld 02/24/2021 124* 70 - 99 mg/dL Final   Glucose reference range applies only to samples taken after fasting for at least 8 hours.  . BUN 02/24/2021 25* 8 - 23 mg/dL Final  . Creatinine, Ser 02/24/2021 1.20  0.61 - 1.24 mg/dL Final  . Calcium 02/24/2021 8.7* 8.9 - 10.3 mg/dL Final  . Total Protein 02/24/2021 6.7  6.5 - 8.1 g/dL Final  . Albumin 02/24/2021 4.0  3.5 - 5.0 g/dL Final  . AST 02/24/2021 29  15 - 41 U/L Final  . ALT 02/24/2021 28  0 - 44 U/L Final  . Alkaline Phosphatase 02/24/2021 62  38 - 126 U/L Final  . Total Bilirubin 02/24/2021 1.1  0.3 - 1.2 mg/dL Final  . GFR, Estimated 02/24/2021 >60  >60 mL/min Final   Comment: (NOTE) Calculated using the CKD-EPI Creatinine Equation (2021)   . Anion gap 02/24/2021 8  5 - 15 Final   Performed at [facility], [place] [address] [place], [place] [address]  . Prothrombin Time  02/24/2021 12.8  11.4 - 15.2 seconds Final  . INR 02/24/2021 1.0  0.8 - 1.2 Final   Comment: (NOTE) INR goal varies based on device and disease states. Performed at [facility], [place] [address] [place], [place] [address]   . aPTT 02/24/2021 33  24 - 36 seconds Final   Performed at [facility], [place] [address], [place], [place] [address]  . ABO/RH(D) 02/24/2021 A NEG   Final  . Antibody Screen 02/24/2021 NEG   Final  . Sample Expiration 02/24/2021 03/10/2021,2359   Final  . Extend sample reason 02/24/2021    Final                   Value:NO TRANSFUSIONS OR PREGNANCY IN THE PAST 3 MONTHS Performed at [facility], [place] [address] [place], [place] [address]   . Hgb A1c MFr Bld 02/24/2021 6.8* 4.8 - 5.6 % Final   Comment: (NOTE) Pre diabetes:          5.7%-6.4%  Diabetes:              >6.4%  Glycemic control for   <7.0% adults with diabetes   . Mean Plasma Glucose 02/24/2021 148.46  mg/dL Final   Performed at [place] [address], [place], [place] [address]  . Glucose-Capillary 02/24/2021 135* 70 - 99 mg/dL Final   Glucose reference range applies only to samples taken after fasting for at least 8 hours.     X-Rays:No results found.  EKG: Orders placed or performed in visit on 03/13/20  . EKG 12-Lead     Hospital Course: John Wilson is a 75 y.o. who was admitted to [facility]. They were brought to the operating room on 03/08/2021 and underwent Procedure(s): TOTAL KNEE ARTHROPLASTY.  Patient tolerated the procedure well and was later transferred to the recovery room and then to the orthopaedic floor for postoperative care. They were given PO and IV analgesics for pain control following their surgery. They were given 24  hours of postoperative antibiotics of  Anti-infectives (From admission, onward)   Start     Dose/Rate Route Frequency Ordered Stop   03/08/21 1400  ceFAZolin (ANCEF) IVPB 2g/100 mL  premix        2 g 200 mL/hr over 30 Minutes Intravenous Every 6 hours 03/08/21 1101 03/08/21 2041   03/08/21 0600  ceFAZolin (ANCEF) IVPB 2g/100 mL premix        2 g 200 mL/hr over 30 Minutes Intravenous On call to O.R. 03/08/21 9169 03/08/21 0842     and started on DVT prophylaxis in the form of Xarelto.   PT and OT were ordered for total joint protocol. Discharge planning consulted to help with postop disposition and equipment needs.  Patient had a good night on the evening of surgery. They started to get up OOB with therapy on POD #0. Pt was seen during rounds and was ready to go home pending progress with therapy. Potassium was 3.4, one dose of 30 mEq KCl was ordered and 250 mL bolus was given due to soft BP. He worked with therapy on POD #1 and was meeting his goals with no issues. Pt was discharged to home later that day in stable condition.  Diet: Cardiac diet Activity: WBAT Follow-up: in 2 weeks Disposition: Home with OPPT Discharged Condition: stable   Discharge Instructions    Call MD / Call 911   Complete by: As directed    If you experience chest pain or shortness of breath, CALL 911 and be transported to the hospital emergency room.  If you develope a fever above 101 F, pus (white drainage) or increased drainage or redness at the wound, or calf pain, call your surgeon's office.   Change dressing   Complete by: As directed    You may remove the bulky bandage (ACE wrap and gauze) two days after surgery. You will have an adhesive waterproof bandage underneath. Leave this in place until your first follow-up appointment.   Constipation Prevention   Complete by: As directed    Drink plenty of fluids.  Prune juice may be helpful.  You may use a stool softener, such as Colace (over the counter) 100 mg twice a day.  Use MiraLax (over the counter) for constipation as needed.   Diet - low sodium heart healthy   Complete by: As directed    Do not put a pillow under the knee. Place it  under the heel.   Complete by: As directed    Driving restrictions   Complete by: As directed    No driving for two weeks   Post-operative opioid taper instructions:   Complete by: As directed    POST-OPERATIVE OPIOID TAPER INSTRUCTIONS: It is important to wean off of your opioid medication as soon as possible. If you do not need pain medication after your surgery it is ok to stop day one. Opioids include: Codeine, Hydrocodone(Norco, Vicodin), Oxycodone(Percocet, oxycontin) and hydromorphone amongst others.  Long term and even short term use of opiods can cause: Increased pain response Dependence Constipation Depression Respiratory depression And more.  Withdrawal symptoms can include Flu like symptoms Nausea, vomiting And more Techniques to manage these symptoms Hydrate well Eat regular healthy meals Stay active Use relaxation techniques(deep breathing, meditating, yoga) Do Not substitute Alcohol to help with tapering If you have been on opioids for less than two weeks and do not have pain than it is ok to stop all together.  Plan to wean off of opioids This plan  should start within one week post op of your joint replacement. Maintain the same interval or time between taking each dose and first decrease the dose.  Cut the total daily intake of opioids by one tablet each day Next start to increase the time between doses. The last dose that should be eliminated is the evening dose.      TED hose   Complete by: As directed    Use stockings (TED hose) for three weeks on both leg(s).  You may remove them at night for sleeping.   Weight bearing as tolerated   Complete by: As directed      Allergies as of 03/09/2021      Reactions   Sulfa Antibiotics Hives      Medication List    STOP taking these medications   aspirin EC 81 MG tablet   indomethacin 50 MG capsule Commonly known as: INDOCIN     TAKE these medications   amLODipine 2.5 MG tablet Commonly known as:  NORVASC Take 1 tablet (2.5 mg total) by mouth daily.   atorvastatin 80 MG tablet Commonly known as: LIPITOR Take 1 tablet (80 mg total) by mouth daily.   gabapentin 300 MG capsule Commonly known as: NEURONTIN Take a 300 mg capsule three times a day for two weeks following surgery.Then take a 300 mg capsule two times a day for two weeks. Then take a 300 mg capsule once a day for two weeks. Then discontinue.   hydrochlorothiazide 25 MG tablet Commonly known as: HYDRODIURIL Take 1 tablet (25 mg total) by mouth daily.   isosorbide mononitrate 30 MG 24 hr tablet Commonly known as: IMDUR Take 1 tablet (30 mg total) by mouth daily.   losartan 100 MG tablet Commonly known as: COZAAR Take 1 tablet (100 mg total) by mouth daily.   metFORMIN 500 MG 24 hr tablet Commonly known as: GLUCOPHAGE-XR Take 1,000 mg by mouth daily with breakfast.   methocarbamol 500 MG tablet Commonly known as: ROBAXIN Take 1 tablet (500 mg total) by mouth every 6 (six) hours as needed for muscle spasms.   metoprolol succinate 50 MG 24 hr tablet Commonly known as: TOPROL-XL Take 50 mg by mouth daily. Take with or immediately following a meal.   nitroGLYCERIN 0.4 MG SL tablet Commonly known as: NITROSTAT Place 0.4 mg under the tongue every 5 (five) minutes as needed for chest pain.   oxyCODONE 5 MG immediate release tablet Commonly known as: Oxy IR/ROXICODONE Take 1-2 tablets (5-10 mg total) by mouth every 6 (six) hours as needed for severe pain. Not to exceed 6 tablets a day.   rivaroxaban 10 MG Tabs tablet Commonly known as: XARELTO Take 1 tablet (10 mg total) by mouth daily with breakfast for 20 days. Then resume one 81 mg aspirin once a day.   traMADol 50 MG tablet Commonly known as: ULTRAM Take 1-2 tablets (50-100 mg total) by mouth every 6 (six) hours as needed for moderate pain.            Discharge Care Instructions  (From admission, onward)         Start     Ordered   03/09/21 0000   Weight bearing as tolerated        03/09/21 0715   03/09/21 0000  Change dressing       Comments: You may remove the bulky bandage (ACE wrap and gauze) two days after surgery. You will have an adhesive waterproof bandage underneath. Leave this in place  until your first follow-up appointment.   03/09/21 0715          Follow-up Information    Gaynelle Arabian, MD. Go on 03/23/2021.   Specialty: Orthopedic Surgery Why: You are scheduled for a post-operative appointment on 03-23-21 at 4:00 pm.  Contact information: [address] [place] [address] [phone]               Signed: Theresa Duty, PA-C Orthopedic Surgery 03/15/2021, 8:01 AM

## 2021-03-16 ENCOUNTER — Encounter: Payer: Self-pay | Admitting: Physical Therapy

## 2021-03-16 ENCOUNTER — Other Ambulatory Visit: Payer: Self-pay

## 2021-03-16 ENCOUNTER — Ambulatory Visit: Payer: Medicare HMO | Attending: Orthopedic Surgery | Admitting: Physical Therapy

## 2021-03-16 DIAGNOSIS — M6281 Muscle weakness (generalized): Secondary | ICD-10-CM

## 2021-03-16 DIAGNOSIS — R600 Localized edema: Secondary | ICD-10-CM | POA: Diagnosis not present

## 2021-03-16 DIAGNOSIS — M25662 Stiffness of left knee, not elsewhere classified: Secondary | ICD-10-CM | POA: Diagnosis not present

## 2021-03-16 DIAGNOSIS — M25562 Pain in left knee: Secondary | ICD-10-CM | POA: Insufficient documentation

## 2021-03-16 DIAGNOSIS — G8929 Other chronic pain: Secondary | ICD-10-CM | POA: Insufficient documentation

## 2021-03-16 NOTE — Therapy (Signed)
[facility] [place], [place], [address] Phone: [phone]   Fax:  [phone]  Physical Therapy Treatment  Patient Details  Name: John Wilson MRN: [id number] Date of Birth: 07/02/1946 Referring Provider (PT): John Arabian MD   Encounter Date: 03/16/2021   PT End of Session - 03/16/21 0823    Visit Number 3    Number of Visits 12    Date for PT Re-Evaluation 04/08/21    Authorization Type FOTO AT LEAST EVERY 5TH VISIT.  PROGRESS NOTE AT 10TH VISIT.  KX MODIFIER AFTER 15 VISITS.    PT Start Time 0819    PT Stop Time 0903    PT Time Calculation (min) 44 min    Equipment Utilized During Treatment Other (comment)   FWW   Activity Tolerance Patient tolerated treatment well    Behavior During Therapy WFL for tasks assessed/performed           Past Medical History:  Diagnosis Date  . Arthritis   . CAD (coronary artery disease)   . Diabetes ([place])   . ED (erectile dysfunction)   . Gout   . Heart murmur   . Hyperlipidemia   . Hypertension   . Obesity   . Vertigo     Past Surgical History:  Procedure Laterality Date  . CHOLECYSTECTOMY  1981  . CORONARY ANGIOPLASTY WITH STENT PLACEMENT  2001   2 separate surgeries, 1 month apart  . HERNIA REPAIR  6010   Umbilical  . LEFT HEART CATH AND CORONARY ANGIOGRAPHY N/A 03/16/2020   Procedure: LEFT HEART CATH AND CORONARY ANGIOGRAPHY;  Surgeon: Troy Sine, MD;  Location: [facility];  Service: Cardiovascular;  Laterality: N/A;  . PILONIDAL CYST EXCISION  1979  . TOTAL KNEE ARTHROPLASTY Left 03/08/2021   Procedure: TOTAL KNEE ARTHROPLASTY;  Surgeon: John Arabian, MD;  Location: [facility];  Service: Orthopedics;  Laterality: Left;  89min    There were no vitals filed for this visit.   Subjective Assessment - 03/16/21 0822    Subjective COVID-19 screen performed prior to patient entering clinic. Has been diligent with HEP from hospital over the weekend. Having more  discomfort today after completing HEP twice daily.    Patient is accompained by: Family member   [name], wife   Pertinent History Coronary angioplasty with stent placement, OA, CAD, DM, HTN, hernia repair, h/o LBP.    How long can you walk comfortably? Around house with a FWW.    Patient Stated Goals Perform ADL's without left knee pain.    Currently in Pain? Yes    Pain Score 7     Pain Location Knee    Pain Orientation Left    Pain Descriptors / Indicators Tightness;Sore    Pain Type Surgical pain    Pain Onset 1 to 4 weeks ago    Pain Frequency Constant              [facility] PT Assessment - 03/16/21 0001      Assessment   Medical Diagnosis Left total knee replacement.    Referring Provider (PT) John Arabian MD    Onset Date/Surgical Date 03/08/21    Hand Dominance Right    Next MD Visit 03/23/2021      Precautions   Precaution Comments No ultrasound.      Restrictions   Weight Bearing Restrictions No  [place] Adult PT Treatment/Exercise - 03/16/21 0001      Knee/Hip Exercises: Aerobic   Nustep L4, seat 11-10 x16 min for ROM      Knee/Hip Exercises: Standing   Hip Flexion AROM;Left;15 reps;Knee bent    Forward Lunges Left;2 sets;10 reps;2 seconds    Forward Lunges Limitations 6" step    Rocker Board 3 minutes      Modalities   Modalities Vasopneumatic      Vasopneumatic   Number Minutes Vasopneumatic  15 minutes    Vasopnuematic Location  Knee    Vasopneumatic Pressure Medium    Vasopneumatic Temperature  34/pain and edema                       PT Long Term Goals - 03/11/21 1748      PT LONG TERM GOAL #1   Title Independent with a HEP.    Time 4    Period Weeks    Status New      PT LONG TERM GOAL #2   Title Full active left knee extension in order to normalize gait.    Time 4    Period Weeks    Status New      PT LONG TERM GOAL #3   Title Active knee flexion to 115 degrees+ so the patient can  perform functional tasks and do so with pain not > 2-3/10.    Time 4    Period Weeks    Status New      PT LONG TERM GOAL #4   Title Decrease edema to within 2.5 cms of non-affected side to assist with pain reduction and range of motion gains.    Time 4    Period Weeks    Status New      PT LONG TERM GOAL #5   Title Increase right hip and knee strength to a solid 4+/5 to provide good stability for accomplishment of functional activities.    Time 4    Period Weeks    Status New      PT LONG TERM GOAL #6   Title Perform a reciprocating stair gait with one railing with pain not > 2-3/10.    Time 4    Period Weeks    Status New                 Plan - 03/16/21 0909    Clinical Impression Statement Patient presented in clinic with reports of more pain after completing knee flexion exercises twice daily at home. Patient also presents with notable LLE edema throughout knee, calf and ankle. Moderate redness notable along edges of aquacell but no excessive temperature noted. Patient able to tolerate light ROM exercises. Dark blue/purple ecchymosis noted in posterior thigh which patient states is the most discomfort. Normal vasopneumatic response noted following removal of the modality.    Personal Factors and Comorbidities Comorbidity 1;Comorbidity 3+;Comorbidity 2;Other    Comorbidities Coronary angioplasty with stent placement, OA, CAD, DM, HTN, hernia repair, h/o LBP.    Examination-Activity Limitations Other;Locomotion Level;Stairs    Examination-Participation Restrictions Other    Stability/Clinical Decision Making Stable/Uncomplicated    Rehab Potential Excellent    PT Frequency 3x / week    PT Duration 4 weeks    PT Treatment/Interventions ADLs/Self Care Home Management;Cryotherapy;Electrical Stimulation;Gait training;Stair training;Functional mobility training;Therapeutic activities;Therapeutic exercise;Neuromuscular re-education;Manual techniques;Patient/family  education;Passive range of motion;Vasopneumatic Device    PT Next Visit Plan Nustep, left knee PROM, VMS to left quadriceps,  progress per TKA protocol.  Vaso/E'Stim.    Consulted and Agree with Plan of Care Patient;Family member/caregiver    Family Member Consulted Wife, Noreene Larsson           Patient will benefit from skilled therapeutic intervention in order to improve the following deficits and impairments:  Pain,Abnormal gait,Decreased activity tolerance,Decreased range of motion,Decreased strength,Increased edema  Visit Diagnosis: Chronic pain of left knee  Stiffness of left knee, not elsewhere classified  Localized edema  Muscle weakness (generalized)     Problem List Patient Active Problem List   Diagnosis Date Noted  . OA (osteoarthritis) of knee 03/08/2021  . Primary osteoarthritis of left knee 03/08/2021  . Abdominal aortic ectasia ([place]) 12/31/2020  . Constipation 12/31/2020  . Idiopathic gout 12/31/2020  . Mixed hyperlipidemia 12/31/2020  . Morbid obesity ([place]) 12/31/2020  . Personal history of colonic polyps 12/31/2020  . Polyneuropathy in diseases classified elsewhere ([place]) 12/31/2020  . Type 2 diabetes mellitus with other circulatory complications ([place]) 58/85/0277  . Diabetes mellitus with coincident hypertension ([place]) 09/16/2019  . Coronary artery disease involving native coronary artery of native heart without angina pectoris 09/16/2019  . Pure hypercholesterolemia 09/16/2019  . Incarcerated umbilical hernia 41/28/7867    Standley Brooking, PTA 03/16/2021, 9:56 AM  [facility] [place], [place], [address] Phone: [phone]   Fax:  [phone]  Name: John Wilson MRN: [id number] Date of Birth: 1946-10-01

## 2021-03-18 ENCOUNTER — Other Ambulatory Visit: Payer: Self-pay

## 2021-03-18 ENCOUNTER — Ambulatory Visit: Payer: Medicare HMO | Admitting: *Deleted

## 2021-03-18 DIAGNOSIS — M6281 Muscle weakness (generalized): Secondary | ICD-10-CM | POA: Diagnosis not present

## 2021-03-18 DIAGNOSIS — M25562 Pain in left knee: Secondary | ICD-10-CM | POA: Diagnosis not present

## 2021-03-18 DIAGNOSIS — G8929 Other chronic pain: Secondary | ICD-10-CM | POA: Diagnosis not present

## 2021-03-18 DIAGNOSIS — R600 Localized edema: Secondary | ICD-10-CM | POA: Diagnosis not present

## 2021-03-18 DIAGNOSIS — M25662 Stiffness of left knee, not elsewhere classified: Secondary | ICD-10-CM

## 2021-03-18 NOTE — Therapy (Signed)
[facility] [place], [place], [address] Phone: [phone]   Fax:  [phone]  Physical Therapy Treatment  Patient Details  Name: John Wilson MRN: [id number] Date of Birth: 13-Dec-1945 Referring Provider (PT): Gaynelle Arabian MD   Encounter Date: 03/18/2021   PT End of Session - 03/18/21 0824    Visit Number 4    Number of Visits 12    Date for PT Re-Evaluation 04/08/21    Authorization Type FOTO AT LEAST EVERY 5TH VISIT.  PROGRESS NOTE AT 10TH VISIT.  KX MODIFIER AFTER 15 VISITS.    PT Start Time 0815    PT Stop Time 0905    PT Time Calculation (min) 50 min           Past Medical History:  Diagnosis Date  . Arthritis   . CAD (coronary artery disease)   . Diabetes ([place])   . ED (erectile dysfunction)   . Gout   . Heart murmur   . Hyperlipidemia   . Hypertension   . Obesity   . Vertigo     Past Surgical History:  Procedure Laterality Date  . CHOLECYSTECTOMY  1981  . CORONARY ANGIOPLASTY WITH STENT PLACEMENT  2001   2 separate surgeries, 1 month apart  . HERNIA REPAIR  5361   Umbilical  . LEFT HEART CATH AND CORONARY ANGIOGRAPHY N/A 03/16/2020   Procedure: LEFT HEART CATH AND CORONARY ANGIOGRAPHY;  Surgeon: Troy Sine, MD;  Location: [facility];  Service: Cardiovascular;  Laterality: N/A;  . PILONIDAL CYST EXCISION  1979  . TOTAL KNEE ARTHROPLASTY Left 03/08/2021   Procedure: TOTAL KNEE ARTHROPLASTY;  Surgeon: Gaynelle Arabian, MD;  Location: [facility];  Service: Orthopedics;  Laterality: Left;  80min    There were no vitals filed for this visit.   Subjective Assessment - 03/18/21 0821    Subjective COVID-19 screen performed prior to patient entering clinic.  Sore but doing Ok    Pertinent History Coronary angioplasty with stent placement, OA, CAD, DM, HTN, hernia repair, h/o LBP.    How long can you walk comfortably? Around house with a FWW.    Patient Stated Goals Perform ADL's without left knee  pain.    Currently in Pain? Yes    Pain Score 6     Pain Location Knee    Pain Orientation Left    Pain Descriptors / Indicators Tightness;Sore    Pain Type Surgical pain    Pain Onset 1 to 4 weeks ago                             [facility] Adult PT Treatment/Exercise - 03/18/21 0001      Knee/Hip Exercises: Aerobic   Nustep L4, seat 11-10,9 x15 min for ROM      Knee/Hip Exercises: Standing   Forward Lunges Left;2 sets;10 reps;2 seconds    Forward Lunges Limitations 14'  step    Rocker Board 3 minutes      Knee/Hip Exercises: Seated   Long Arc Quad Strengthening;Left;1 set;10 reps   hold 5 secs   Long Arc Quad Weight 2 lbs.      Modalities   Modalities Vasopneumatic      Vasopneumatic   Number Minutes Vasopneumatic  15 minutes    Vasopnuematic Location  Knee    Vasopneumatic Pressure Medium    Vasopneumatic Temperature  34/pain and edema      Manual Therapy  Manual Therapy Passive ROM    Passive ROM with focus on extension ROM                       PT Long Term Goals - 03/11/21 1748      PT LONG TERM GOAL #1   Title Independent with a HEP.    Time 4    Period Weeks    Status New      PT LONG TERM GOAL #2   Title Full active left knee extension in order to normalize gait.    Time 4    Period Weeks    Status New      PT LONG TERM GOAL #3   Title Active knee flexion to 115 degrees+ so the patient can perform functional tasks and do so with pain not > 2-3/10.    Time 4    Period Weeks    Status New      PT LONG TERM GOAL #4   Title Decrease edema to within 2.5 cms of non-affected side to assist with pain reduction and range of motion gains.    Time 4    Period Weeks    Status New      PT LONG TERM GOAL #5   Title Increase right hip and knee strength to a solid 4+/5 to provide good stability for accomplishment of functional activities.    Time 4    Period Weeks    Status New      PT LONG TERM GOAL #6   Title Perform a  reciprocating stair gait with one railing with pain not > 2-3/10.    Time 4    Period Weeks    Status New                 Plan - 03/18/21 1610    Clinical Impression Statement Pt arrived today with LT knee pain/swelling , but ok. Rx focused on quad activation as well as improving ROM. He was able to reach 90 degrees flexion today and -5 degrees extension. Vaso for edema tolerated well.    Personal Factors and Comorbidities Comorbidity 1;Comorbidity 3+;Comorbidity 2;Other    Comorbidities Coronary angioplasty with stent placement, OA, CAD, DM, HTN, hernia repair, h/o LBP.    Examination-Participation Restrictions Other    Stability/Clinical Decision Making Stable/Uncomplicated    Rehab Potential Excellent    PT Frequency 3x / week    PT Duration 4 weeks    PT Treatment/Interventions ADLs/Self Care Home Management;Cryotherapy;Electrical Stimulation;Gait training;Stair training;Functional mobility training;Therapeutic activities;Therapeutic exercise;Neuromuscular re-education;Manual techniques;Patient/family education;Passive range of motion;Vasopneumatic Device    PT Next Visit Plan Nustep, left knee PROM, VMS to left quadriceps, progress per TKA protocol.  Vaso/E'Stim.    Consulted and Agree with Plan of Care Patient           Patient will benefit from skilled therapeutic intervention in order to improve the following deficits and impairments:  Pain,Abnormal gait,Decreased activity tolerance,Decreased range of motion,Decreased strength,Increased edema  Visit Diagnosis: Chronic pain of left knee  Stiffness of left knee, not elsewhere classified  Localized edema  Muscle weakness (generalized)     Problem List Patient Active Problem List   Diagnosis Date Noted  . OA (osteoarthritis) of knee 03/08/2021  . Primary osteoarthritis of left knee 03/08/2021  . Abdominal aortic ectasia ([place]) 12/31/2020  . Constipation 12/31/2020  . Idiopathic gout 12/31/2020  . Mixed  hyperlipidemia 12/31/2020  . Morbid obesity ([place]) 12/31/2020  . Personal history  of colonic polyps 12/31/2020  . Polyneuropathy in diseases classified elsewhere ([place]) 12/31/2020  . Type 2 diabetes mellitus with other circulatory complications ([place]) 57/32/2025  . Diabetes mellitus with coincident hypertension ([place]) 09/16/2019  . Coronary artery disease involving native coronary artery of native heart without angina pectoris 09/16/2019  . Pure hypercholesterolemia 09/16/2019  . Incarcerated umbilical hernia 42/70/6237    Caddie Randle,CHRIS, PTA 03/18/2021, 11:07 AM  [facility] [place], [place], [address] Phone: [phone]   Fax:  [phone]  Name: Butler Vegh MRN: [id number] Date of Birth: July 11, 1946

## 2021-03-23 ENCOUNTER — Other Ambulatory Visit: Payer: Self-pay

## 2021-03-23 ENCOUNTER — Ambulatory Visit: Payer: Medicare HMO | Admitting: *Deleted

## 2021-03-23 DIAGNOSIS — G8929 Other chronic pain: Secondary | ICD-10-CM | POA: Diagnosis not present

## 2021-03-23 DIAGNOSIS — R600 Localized edema: Secondary | ICD-10-CM | POA: Diagnosis not present

## 2021-03-23 DIAGNOSIS — M25662 Stiffness of left knee, not elsewhere classified: Secondary | ICD-10-CM

## 2021-03-23 DIAGNOSIS — M6281 Muscle weakness (generalized): Secondary | ICD-10-CM

## 2021-03-23 DIAGNOSIS — M25562 Pain in left knee: Secondary | ICD-10-CM | POA: Diagnosis not present

## 2021-03-23 NOTE — Therapy (Signed)
[facility] [place], [place], [address] Phone: [phone]   Fax:  [phone]  Physical Therapy Treatment  Patient Details  Name: John Wilson MRN: [id number] Date of Birth: Nov 20, 1945 Referring Provider (PT): Gaynelle Arabian MD   Encounter Date: 03/23/2021   PT End of Session - 03/23/21 0828    Visit Number 5    Number of Visits 12    Date for PT Re-Evaluation 04/08/21    Authorization Type FOTO AT LEAST EVERY 5TH VISIT.  PROGRESS NOTE AT 10TH VISIT.  KX MODIFIER AFTER 15 VISITS.    PT Start Time 0815    PT Stop Time 0906    PT Time Calculation (min) 51 min           Past Medical History:  Diagnosis Date  . Arthritis   . CAD (coronary artery disease)   . Diabetes ([place])   . ED (erectile dysfunction)   . Gout   . Heart murmur   . Hyperlipidemia   . Hypertension   . Obesity   . Vertigo     Past Surgical History:  Procedure Laterality Date  . CHOLECYSTECTOMY  1981  . CORONARY ANGIOPLASTY WITH STENT PLACEMENT  2001   2 separate surgeries, 1 month apart  . HERNIA REPAIR  3790   Umbilical  . LEFT HEART CATH AND CORONARY ANGIOGRAPHY N/A 03/16/2020   Procedure: LEFT HEART CATH AND CORONARY ANGIOGRAPHY;  Surgeon: Troy Sine, MD;  Location: [facility];  Service: Cardiovascular;  Laterality: N/A;  . PILONIDAL CYST EXCISION  1979  . TOTAL KNEE ARTHROPLASTY Left 03/08/2021   Procedure: TOTAL KNEE ARTHROPLASTY;  Surgeon: Gaynelle Arabian, MD;  Location: [facility];  Service: Orthopedics;  Laterality: Left;  9min    There were no vitals filed for this visit.   Subjective Assessment - 03/23/21 0825    Subjective COVID-19 screen performed prior to patient entering clinic.  To MD today    Pertinent History Coronary angioplasty with stent placement, OA, CAD, DM, HTN, hernia repair, h/o LBP.    How long can you walk comfortably? Around house with a FWW.    Patient Stated Goals Perform ADL's without left knee pain.     Currently in Pain? Yes    Pain Score 5     Pain Location Knee    Pain Orientation Left    Pain Type Surgical pain    Pain Onset 1 to 4 weeks ago                             [facility] Adult PT Treatment/Exercise - 03/23/21 0001      Knee/Hip Exercises: Aerobic   Nustep L4, seat 11-10,9 x15 min for ROM      Knee/Hip Exercises: Standing   Forward Lunges Left;2 sets;10 reps;2 seconds    Forward Lunges Limitations 14'  step    Rocker Board 3 minutes      Knee/Hip Exercises: Seated   Long Arc Quad Strengthening;Left;10 reps;2 sets   hold 5 secs     Modalities   Modalities Vasopneumatic      Vasopneumatic   Number Minutes Vasopneumatic  15 minutes    Vasopnuematic Location  Knee    Vasopneumatic Pressure Medium    Vasopneumatic Temperature  34/pain and edema      Manual Therapy   Manual Therapy Passive ROM    Passive ROM PROM for flexion and extension ROM  PT Long Term Goals - 03/11/21 1748      PT LONG TERM GOAL #1   Title Independent with a HEP.    Time 4    Period Weeks    Status New      PT LONG TERM GOAL #2   Title Full active left knee extension in order to normalize gait.    Time 4    Period Weeks    Status New      PT LONG TERM GOAL #3   Title Active knee flexion to 115 degrees+ so the patient can perform functional tasks and do so with pain not > 2-3/10.    Time 4    Period Weeks    Status New      PT LONG TERM GOAL #4   Title Decrease edema to within 2.5 cms of non-affected side to assist with pain reduction and range of motion gains.    Time 4    Period Weeks    Status New      PT LONG TERM GOAL #5   Title Increase right hip and knee strength to a solid 4+/5 to provide good stability for accomplishment of functional activities.    Time 4    Period Weeks    Status New      PT LONG TERM GOAL #6   Title Perform a reciprocating stair gait with one railing with pain not > 2-3/10.    Time 4     Period Weeks    Status New                 Plan - 03/23/21 6301    Clinical Impression Statement To MD today. Pt  arrived today using FWW to ambulate.Rx focused on ROM as well as quad activation and strengthening. Pt did well with PROM 5-90 degrees today. Pt has pitting edema along anterior aspect lower leg and into foot. He reports wearing stocking intermittently during the day, but not at night.    Personal Factors and Comorbidities Comorbidity 1;Comorbidity 3+;Comorbidity 2;Other    Comorbidities Coronary angioplasty with stent placement, OA, CAD, DM, HTN, hernia repair, h/o LBP.    Rehab Potential Excellent    PT Frequency 3x / week    PT Duration 4 weeks    PT Treatment/Interventions ADLs/Self Care Home Management;Cryotherapy;Electrical Stimulation;Gait training;Stair training;Functional mobility training;Therapeutic activities;Therapeutic exercise;Neuromuscular re-education;Manual techniques;Patient/family education;Passive range of motion;Vasopneumatic Device    PT Next Visit Plan Nustep, left knee PROM, VMS to left quadriceps, progress per TKA protocol.  Vaso/E'Stim.  Send MD note           Patient will benefit from skilled therapeutic intervention in order to improve the following deficits and impairments:  Pain,Abnormal gait,Decreased activity tolerance,Decreased range of motion,Decreased strength,Increased edema  Visit Diagnosis: Chronic pain of left knee  Stiffness of left knee, not elsewhere classified  Muscle weakness (generalized)     Problem List Patient Active Problem List   Diagnosis Date Noted  . OA (osteoarthritis) of knee 03/08/2021  . Primary osteoarthritis of left knee 03/08/2021  . Abdominal aortic ectasia ([place]) 12/31/2020  . Constipation 12/31/2020  . Idiopathic gout 12/31/2020  . Mixed hyperlipidemia 12/31/2020  . Morbid obesity ([place]) 12/31/2020  . Personal history of colonic polyps 12/31/2020  . Polyneuropathy in diseases classified  elsewhere ([place]) 12/31/2020  . Type 2 diabetes mellitus with other circulatory complications ([place]) 60/08/9322  . Diabetes mellitus with coincident hypertension ([place]) 09/16/2019  . Coronary artery disease involving native coronary artery of  native heart without angina pectoris 09/16/2019  . Pure hypercholesterolemia 09/16/2019  . Incarcerated umbilical hernia 34/19/3790    Evelynn Hench,CHRIS, PTA 03/23/2021, 9:09 AM  [facility] [place], [place], [address] Phone: [phone]   Fax:  [phone]  Name: John Wilson MRN: [id number] Date of Birth: 1946/01/03

## 2021-03-25 ENCOUNTER — Ambulatory Visit: Payer: Medicare HMO | Admitting: *Deleted

## 2021-03-25 ENCOUNTER — Encounter: Payer: Self-pay | Admitting: *Deleted

## 2021-03-25 ENCOUNTER — Other Ambulatory Visit: Payer: Self-pay

## 2021-03-25 DIAGNOSIS — M25662 Stiffness of left knee, not elsewhere classified: Secondary | ICD-10-CM

## 2021-03-25 DIAGNOSIS — R600 Localized edema: Secondary | ICD-10-CM

## 2021-03-25 DIAGNOSIS — G8929 Other chronic pain: Secondary | ICD-10-CM | POA: Diagnosis not present

## 2021-03-25 DIAGNOSIS — M25562 Pain in left knee: Secondary | ICD-10-CM | POA: Diagnosis not present

## 2021-03-25 DIAGNOSIS — M6281 Muscle weakness (generalized): Secondary | ICD-10-CM | POA: Diagnosis not present

## 2021-03-25 NOTE — Therapy (Signed)
[facility] [place], [place], [address] Phone: [phone]   Fax:  [phone]  Physical Therapy Treatment  Patient Details  Name: John Wilson MRN: [id number] Date of Birth: 27-Jul-1946 Referring Provider (PT): Gaynelle Arabian MD   Encounter Date: 03/25/2021   PT End of Session - 03/25/21 0829    Visit Number 6    Number of Visits 12    Date for PT Re-Evaluation 04/08/21    Authorization Type FOTO AT LEAST EVERY 5TH VISIT.  PROGRESS NOTE AT 10TH VISIT.  KX MODIFIER AFTER 15 VISITS.    PT Start Time 0815    PT Stop Time 0910    PT Time Calculation (min) 55 min           Past Medical History:  Diagnosis Date  . Arthritis   . CAD (coronary artery disease)   . Diabetes ([place])   . ED (erectile dysfunction)   . Gout   . Heart murmur   . Hyperlipidemia   . Hypertension   . Obesity   . Vertigo     Past Surgical History:  Procedure Laterality Date  . CHOLECYSTECTOMY  1981  . CORONARY ANGIOPLASTY WITH STENT PLACEMENT  2001   2 separate surgeries, 1 month apart  . HERNIA REPAIR  1829   Umbilical  . LEFT HEART CATH AND CORONARY ANGIOGRAPHY N/A 03/16/2020   Procedure: LEFT HEART CATH AND CORONARY ANGIOGRAPHY;  Surgeon: Troy Sine, MD;  Location: [facility];  Service: Cardiovascular;  Laterality: N/A;  . PILONIDAL CYST EXCISION  1979  . TOTAL KNEE ARTHROPLASTY Left 03/08/2021   Procedure: TOTAL KNEE ARTHROPLASTY;  Surgeon: Gaynelle Arabian, MD;  Location: [facility];  Service: Orthopedics;  Laterality: Left;  51min    There were no vitals filed for this visit.   Subjective Assessment - 03/25/21 0828    Subjective COVID-19 screen performed prior to patient entering clinic.  MD removed aquacell and said everything looks good.    Pertinent History Coronary angioplasty with stent placement, OA, CAD, DM, HTN, hernia repair, h/o LBP.    How long can you walk comfortably? Around house with a FWW.    Patient Stated  Goals Perform ADL's without left knee pain.    Currently in Pain? Yes    Pain Score 4     Pain Location Knee    Pain Orientation Left    Pain Descriptors / Indicators Tightness;Sore    Pain Type Surgical pain    Pain Onset 1 to 4 weeks ago                             [facility] Adult PT Treatment/Exercise - 03/25/21 0001      Knee/Hip Exercises: Aerobic   Nustep L4, seat 11-10,9 x15 min for ROM      Knee/Hip Exercises: Standing   Heel Raises Both;1 set;20 reps    Heel Raises Limitations Toe raises x 20    Hip Flexion AROM;Left;15 reps;Knee bent    Rocker Board 3 minutes    SLS LT SLS x 3 mins total      Knee/Hip Exercises: Seated   Long Arc Quad Strengthening;Left;10 reps;2 sets    Long Arc Quad Weight 2 lbs.      Modalities   Modalities Vasopneumatic      Vasopneumatic   Number Minutes Vasopneumatic  10 minutes    Vasopnuematic Location  Knee    Vasopneumatic Pressure  Medium    Vasopneumatic Temperature  34/pain and edema      Manual Therapy   Manual Therapy Passive ROM    Passive ROM PROM for  extension ROM                       PT Long Term Goals - 03/11/21 1748      PT LONG TERM GOAL #1   Title Independent with a HEP.    Time 4    Period Weeks    Status New      PT LONG TERM GOAL #2   Title Full active left knee extension in order to normalize gait.    Time 4    Period Weeks    Status New      PT LONG TERM GOAL #3   Title Active knee flexion to 115 degrees+ so the patient can perform functional tasks and do so with pain not > 2-3/10.    Time 4    Period Weeks    Status New      PT LONG TERM GOAL #4   Title Decrease edema to within 2.5 cms of non-affected side to assist with pain reduction and range of motion gains.    Time 4    Period Weeks    Status New      PT LONG TERM GOAL #5   Title Increase right hip and knee strength to a solid 4+/5 to provide good stability for accomplishment of functional activities.    Time  4    Period Weeks    Status New      PT LONG TERM GOAL #6   Title Perform a reciprocating stair gait with one railing with pain not > 2-3/10.    Time 4    Period Weeks    Status New                 Plan - 03/25/21 0830    Clinical Impression Statement Pt arrived today doing well and reports MD being pleased with LT knee status. Today's Rx focused on standing exs as well as  sitting f/b PROM for extension. Incision looked good and also with decreased swelling.    Personal Factors and Comorbidities Comorbidity 1;Comorbidity 3+;Comorbidity 2;Other    Comorbidities Coronary angioplasty with stent placement, OA, CAD, DM, HTN, hernia repair, h/o LBP.    Examination-Participation Restrictions Other    Stability/Clinical Decision Making Stable/Uncomplicated    Rehab Potential Excellent    PT Frequency 3x / week    PT Duration 4 weeks    PT Treatment/Interventions ADLs/Self Care Home Management;Cryotherapy;Electrical Stimulation;Gait training;Stair training;Functional mobility training;Therapeutic activities;Therapeutic exercise;Neuromuscular re-education;Manual techniques;Patient/family education;Passive range of motion;Vasopneumatic Device    PT Next Visit Plan Nustep, left knee PROM, VMS to left quadriceps, progress per TKA protocol.  Vaso/E'Stim.           Patient will benefit from skilled therapeutic intervention in order to improve the following deficits and impairments:  Pain,Abnormal gait,Decreased activity tolerance,Decreased range of motion,Decreased strength,Increased edema  Visit Diagnosis: Chronic pain of left knee  Stiffness of left knee, not elsewhere classified  Muscle weakness (generalized)  Localized edema     Problem List Patient Active Problem List   Diagnosis Date Noted  . OA (osteoarthritis) of knee 03/08/2021  . Primary osteoarthritis of left knee 03/08/2021  . Abdominal aortic ectasia ([place]) 12/31/2020  . Constipation 12/31/2020  . Idiopathic gout  12/31/2020  . Mixed hyperlipidemia 12/31/2020  . Morbid  obesity ([place]) 12/31/2020  . Personal history of colonic polyps 12/31/2020  . Polyneuropathy in diseases classified elsewhere ([place]) 12/31/2020  . Type 2 diabetes mellitus with other circulatory complications ([place]) 84/69/6295  . Diabetes mellitus with coincident hypertension ([place]) 09/16/2019  . Coronary artery disease involving native coronary artery of native heart without angina pectoris 09/16/2019  . Pure hypercholesterolemia 09/16/2019  . Incarcerated umbilical hernia 28/41/3244    Omayra Tulloch,CHRIS, PTA 03/25/2021, 9:58 AM  [facility] [place], [place], [address] Phone: [phone]   Fax:  [phone]  Name: Kendarious Gudino MRN: [id number] Date of Birth: 17-Mar-1946

## 2021-03-30 ENCOUNTER — Ambulatory Visit: Payer: Medicare HMO | Admitting: Physical Therapy

## 2021-03-30 ENCOUNTER — Other Ambulatory Visit: Payer: Self-pay

## 2021-03-30 ENCOUNTER — Encounter: Payer: Self-pay | Admitting: Physical Therapy

## 2021-03-30 DIAGNOSIS — R600 Localized edema: Secondary | ICD-10-CM

## 2021-03-30 DIAGNOSIS — M6281 Muscle weakness (generalized): Secondary | ICD-10-CM | POA: Diagnosis not present

## 2021-03-30 DIAGNOSIS — G8929 Other chronic pain: Secondary | ICD-10-CM | POA: Diagnosis not present

## 2021-03-30 DIAGNOSIS — M25662 Stiffness of left knee, not elsewhere classified: Secondary | ICD-10-CM

## 2021-03-30 DIAGNOSIS — M25562 Pain in left knee: Secondary | ICD-10-CM | POA: Diagnosis not present

## 2021-03-30 NOTE — Therapy (Signed)
[facility] [place], [place], [address] Phone: [phone]   Fax:  [phone]  Physical Therapy Treatment  Patient Details  Name: John Wilson MRN: [id number] Date of Birth: [birth year] Referring Provider (PT): Gaynelle Arabian MD   Encounter Date: 03/30/2021   PT End of Session - 03/30/21 0951    Visit Number 7    Number of Visits 12    Date for PT Re-Evaluation 04/08/21    Authorization Type FOTO AT LEAST EVERY 5TH VISIT.  PROGRESS NOTE AT 10TH VISIT.  KX MODIFIER AFTER 15 VISITS.    PT Start Time 0817    PT Stop Time 0910    PT Time Calculation (min) 53 min    Activity Tolerance Patient tolerated treatment well    Behavior During Therapy [facility] for tasks assessed/performed           Past Medical History:  Diagnosis Date  . Arthritis   . CAD (coronary artery disease)   . Diabetes ([place])   . ED (erectile dysfunction)   . Gout   . Heart murmur   . Hyperlipidemia   . Hypertension   . Obesity   . Vertigo     Past Surgical History:  Procedure Laterality Date  . CHOLECYSTECTOMY  1981  . CORONARY ANGIOPLASTY WITH STENT PLACEMENT  2001   2 separate surgeries, 1 month apart  . HERNIA REPAIR  1607   Umbilical  . LEFT HEART CATH AND CORONARY ANGIOGRAPHY N/A 03/16/2020   Procedure: LEFT HEART CATH AND CORONARY ANGIOGRAPHY;  Surgeon: Troy Sine, MD;  Location: [place] [facility];  Service: Cardiovascular;  Laterality: N/A;  . PILONIDAL CYST EXCISION  1979  . TOTAL KNEE ARTHROPLASTY Left 03/08/2021   Procedure: TOTAL KNEE ARTHROPLASTY;  Surgeon: Gaynelle Arabian, MD;  Location: [facility];  Service: Orthopedics;  Laterality: Left;  29min    There were no vitals filed for this visit.   Subjective Assessment - 03/30/21 0841    Subjective COVID-19 screen performed prior to patient entering clinic.  Working on home exercises.    Patient is accompained by: Family member    Pertinent History Coronary angioplasty with stent  placement, OA, CAD, DM, HTN, hernia repair, h/o LBP.    How long can you walk comfortably? Around house with a FWW.    Patient Stated Goals Perform ADL's without left knee pain.    Currently in Pain? Yes    Pain Score 4     Pain Location Knee    Pain Orientation Left    Pain Descriptors / Indicators Tightness;Sore    Pain Type Surgical pain    Pain Onset 1 to 4 weeks ago                             [facility] Adult PT Treatment/Exercise - 03/30/21 0001      Exercises   Exercises Knee/Hip      Knee/Hip Exercises: Aerobic   Nustep Level 5 x 15 minutes moving seat forward x 2 to increase knee flexion.      Knee/Hip Exercises: Supine   Short Arc Quad Sets Limitations Left SAQ's x 16 minutes faciltated with Bi-Phasic e'stim with 10 sec extension holds and 10 sec rest.      Modalities   Modalities Vasopneumatic      Vasopneumatic   Number Minutes Vasopneumatic  15 minutes    Vasopnuematic Location  --   LEFT KNEE.  Vasopneumatic Pressure Medium      Manual Therapy   Manual Therapy Passive ROM    Passive ROM In supine:  PROM x 4 minutes to patient's left knee into flexion and extension.                       PT Long Term Goals - 03/11/21 1748      PT LONG TERM GOAL #1   Title Independent with a HEP.    Time 4    Period Weeks    Status New      PT LONG TERM GOAL #2   Title Full active left knee extension in order to normalize gait.    Time 4    Period Weeks    Status New      PT LONG TERM GOAL #3   Title Active knee flexion to 115 degrees+ so the patient can perform functional tasks and do so with pain not > 2-3/10.    Time 4    Period Weeks    Status New      PT LONG TERM GOAL #4   Title Decrease edema to within 2.5 cms of non-affected side to assist with pain reduction and range of motion gains.    Time 4    Period Weeks    Status New      PT LONG TERM GOAL #5   Title Increase right hip and knee strength to a solid 4+/5 to provide  good stability for accomplishment of functional activities.    Time 4    Period Weeks    Status New      PT LONG TERM GOAL #6   Title Perform a reciprocating stair gait with one railing with pain not > 2-3/10.    Time 4    Period Weeks    Status New                 Plan - 03/30/21 0947    Clinical Impression Statement Patient is progressing very well.  Excellent left SAQ's facilitated with Bi-Phasic e'stim.  His range of motion is improving nicely.    Personal Factors and Comorbidities Comorbidity 1;Comorbidity 3+;Comorbidity 2;Other    Comorbidities Coronary angioplasty with stent placement, OA, CAD, DM, HTN, hernia repair, h/o LBP.    Examination-Activity Limitations Other;Locomotion Level;Stairs    Examination-Participation Restrictions Other    Stability/Clinical Decision Making Stable/Uncomplicated    Rehab Potential Excellent    PT Frequency 3x / week    PT Duration 4 weeks    PT Treatment/Interventions ADLs/Self Care Home Management;Cryotherapy;Electrical Stimulation;Gait training;Stair training;Functional mobility training;Therapeutic activities;Therapeutic exercise;Neuromuscular re-education;Manual techniques;Patient/family education;Passive range of motion;Vasopneumatic Device    PT Next Visit Plan Nustep, left knee PROM, VMS to left quadriceps, progress per TKA protocol.  Vaso/E'Stim.    Consulted and Agree with Plan of Care Patient           Patient will benefit from skilled therapeutic intervention in order to improve the following deficits and impairments:  Pain,Abnormal gait,Decreased activity tolerance,Decreased range of motion,Decreased strength,Increased edema  Visit Diagnosis: Chronic pain of left knee  Stiffness of left knee, not elsewhere classified  Muscle weakness (generalized)  Localized edema     Problem List Patient Active Problem List   Diagnosis Date Noted  . OA (osteoarthritis) of knee 03/08/2021  . Primary osteoarthritis of left  knee 03/08/2021  . Abdominal aortic ectasia ([place]) 12/31/2020  . Constipation 12/31/2020  . Idiopathic gout 12/31/2020  . Mixed  hyperlipidemia 12/31/2020  . Morbid obesity ([place]) 12/31/2020  . Personal history of colonic polyps 12/31/2020  . Polyneuropathy in diseases classified elsewhere ([place]) 12/31/2020  . Type 2 diabetes mellitus with other circulatory complications ([place]) 76/72/0947  . Diabetes mellitus with coincident hypertension ([place]) 09/16/2019  . Coronary artery disease involving native coronary artery of native heart without angina pectoris 09/16/2019  . Pure hypercholesterolemia 09/16/2019  . Incarcerated umbilical hernia 09/62/8366    Fernanda Twaddell, [name] 03/30/2021, 9:53 AM  [facility] [place], [place], [address] Phone: [phone]   Fax:  [phone]  Name: Bart Ashford MRN: [id number] Date of Birth: [birth year]

## 2021-04-02 ENCOUNTER — Other Ambulatory Visit: Payer: Self-pay

## 2021-04-02 ENCOUNTER — Ambulatory Visit: Payer: Medicare HMO | Admitting: Physical Therapy

## 2021-04-02 DIAGNOSIS — M25562 Pain in left knee: Secondary | ICD-10-CM

## 2021-04-02 DIAGNOSIS — G8929 Other chronic pain: Secondary | ICD-10-CM

## 2021-04-02 DIAGNOSIS — M25662 Stiffness of left knee, not elsewhere classified: Secondary | ICD-10-CM

## 2021-04-02 DIAGNOSIS — R600 Localized edema: Secondary | ICD-10-CM | POA: Diagnosis not present

## 2021-04-02 DIAGNOSIS — M6281 Muscle weakness (generalized): Secondary | ICD-10-CM

## 2021-04-02 NOTE — Therapy (Signed)
[facility] [place], [place], [address] Phone: [phone]   Fax:  [phone]  Physical Therapy Treatment  Patient Details  Name: John Wilson MRN: [id number] Date of Birth: [birth year] Referring Provider (PT): Gaynelle Arabian MD   Encounter Date: 04/02/2021   PT End of Session - 04/02/21 1227    Visit Number 8    Number of Visits 12    Date for PT Re-Evaluation 04/08/21    Authorization Type FOTO AT LEAST EVERY 5TH VISIT.  PROGRESS NOTE AT 10TH VISIT.  KX MODIFIER AFTER 15 VISITS.    PT Start Time 0815    PT Stop Time 0911    PT Time Calculation (min) 56 min    Activity Tolerance Patient tolerated treatment well           Past Medical History:  Diagnosis Date  . Arthritis   . CAD (coronary artery disease)   . Diabetes ([place])   . ED (erectile dysfunction)   . Gout   . Heart murmur   . Hyperlipidemia   . Hypertension   . Obesity   . Vertigo     Past Surgical History:  Procedure Laterality Date  . CHOLECYSTECTOMY  1981  . CORONARY ANGIOPLASTY WITH STENT PLACEMENT  2001   2 separate surgeries, 1 month apart  . HERNIA REPAIR  4010   Umbilical  . LEFT HEART CATH AND CORONARY ANGIOGRAPHY N/A 03/16/2020   Procedure: LEFT HEART CATH AND CORONARY ANGIOGRAPHY;  Surgeon: Troy Sine, MD;  Location: [place] [facility];  Service: Cardiovascular;  Laterality: N/A;  . PILONIDAL CYST EXCISION  1979  . TOTAL KNEE ARTHROPLASTY Left 03/08/2021   Procedure: TOTAL KNEE ARTHROPLASTY;  Surgeon: Gaynelle Arabian, MD;  Location: [facility];  Service: Orthopedics;  Laterality: Left;  68min    There were no vitals filed for this visit.   Subjective Assessment - 04/02/21 1010    Subjective COVID-19 screen performed prior to patient entering clinic.  No new complaints.    Patient is accompained by: Family member    Pertinent History Coronary angioplasty with stent placement, OA, CAD, DM, HTN, hernia repair, h/o LBP.    How long can  you walk comfortably? Around house with a FWW.    Patient Stated Goals Perform ADL's without left knee pain.    Currently in Pain? Yes    Pain Score 4     Pain Location Knee    Pain Descriptors / Indicators Tightness;Burning;Sore    Pain Type Surgical pain    Pain Onset 1 to 4 weeks ago                             [facility] Adult PT Treatment/Exercise - 04/02/21 0001      Exercises   Exercises Knee/Hip      Knee/Hip Exercises: Aerobic   Nustep Level 5 x 15 minutes moving seat forward x 2 to increase knee flexion.      Knee/Hip Exercises: Supine   Short Arc Quad Sets Limitations Left SAQ's x 16 minutes with 2# facilitated with Bi-Phasic electrical stimulation with 10 sec extension holds and 10 sec rest.      Modalities   Modalities Electrical Stimulation;Vasopneumatic      Electrical Stimulation   Electrical Stimulation Location Left knee.    Electrical Stimulation Action Pre-mod.    Electrical Stimulation Parameters 80-150 Hz x 15 minutes    Electrical Stimulation Goals Pain  Vasopneumatic   Number Minutes Vasopneumatic  15 minutes    Vasopnuematic Location  --   Left knee.   Vasopneumatic Pressure Medium                       PT Long Term Goals - 03/11/21 1748      PT LONG TERM GOAL #1   Title Independent with a HEP.    Time 4    Period Weeks    Status New      PT LONG TERM GOAL #2   Title Full active left knee extension in order to normalize gait.    Time 4    Period Weeks    Status New      PT LONG TERM GOAL #3   Title Active knee flexion to 115 degrees+ so the patient can perform functional tasks and do so with pain not > 2-3/10.    Time 4    Period Weeks    Status New      PT LONG TERM GOAL #4   Title Decrease edema to within 2.5 cms of non-affected side to assist with pain reduction and range of motion gains.    Time 4    Period Weeks    Status New      PT LONG TERM GOAL #5   Title Increase right hip and knee  strength to a solid 4+/5 to provide good stability for accomplishment of functional activities.    Time 4    Period Weeks    Status New      PT LONG TERM GOAL #6   Title Perform a reciprocating stair gait with one railing with pain not > 2-3/10.    Time 4    Period Weeks    Status New                 Plan - 04/02/21 1108    Clinical Impression Statement The patient is doing very well.  He tolerated 2# SAQ's without difficulty.    Personal Factors and Comorbidities Comorbidity 1;Comorbidity 3+;Comorbidity 2;Other    Comorbidities Coronary angioplasty with stent placement, OA, CAD, DM, HTN, hernia repair, h/o LBP.    Examination-Activity Limitations Other;Locomotion Level;Stairs    Examination-Participation Restrictions Other    Stability/Clinical Decision Making Stable/Uncomplicated    Rehab Potential Excellent    PT Frequency 3x / week    PT Duration 4 weeks    PT Treatment/Interventions ADLs/Self Care Home Management;Cryotherapy;Electrical Stimulation;Gait training;Stair training;Functional mobility training;Therapeutic activities;Therapeutic exercise;Neuromuscular re-education;Manual techniques;Patient/family education;Passive range of motion;Vasopneumatic Device           Patient will benefit from skilled therapeutic intervention in order to improve the following deficits and impairments:  Pain,Abnormal gait,Decreased activity tolerance,Decreased range of motion,Decreased strength,Increased edema  Visit Diagnosis: Chronic pain of left knee  Stiffness of left knee, not elsewhere classified  Muscle weakness (generalized)     Problem List Patient Active Problem List   Diagnosis Date Noted  . OA (osteoarthritis) of knee 03/08/2021  . Primary osteoarthritis of left knee 03/08/2021  . Abdominal aortic ectasia ([place]) 12/31/2020  . Constipation 12/31/2020  . Idiopathic gout 12/31/2020  . Mixed hyperlipidemia 12/31/2020  . Morbid obesity ([place]) 12/31/2020  . Personal  history of colonic polyps 12/31/2020  . Polyneuropathy in diseases classified elsewhere ([place]) 12/31/2020  . Type 2 diabetes mellitus with other circulatory complications ([place]) 47/82/9562  . Diabetes mellitus with coincident hypertension ([place]) 09/16/2019  . Coronary artery disease involving native coronary  artery of native heart without angina pectoris 09/16/2019  . Pure hypercholesterolemia 09/16/2019  . Incarcerated umbilical hernia 92/33/0076    Edison Nicholson, [name] 04/02/2021, 12:30 PM  [facility] [place], [place], [address] Phone: [phone]   Fax:  [phone]  Name: John Wilson MRN: [id number] Date of Birth: Feb 28, 1946

## 2021-04-06 ENCOUNTER — Ambulatory Visit: Payer: Medicare HMO | Admitting: *Deleted

## 2021-04-06 ENCOUNTER — Other Ambulatory Visit: Payer: Self-pay

## 2021-04-06 DIAGNOSIS — R600 Localized edema: Secondary | ICD-10-CM | POA: Diagnosis not present

## 2021-04-06 DIAGNOSIS — M25662 Stiffness of left knee, not elsewhere classified: Secondary | ICD-10-CM

## 2021-04-06 DIAGNOSIS — M25562 Pain in left knee: Secondary | ICD-10-CM | POA: Diagnosis not present

## 2021-04-06 DIAGNOSIS — G8929 Other chronic pain: Secondary | ICD-10-CM

## 2021-04-06 DIAGNOSIS — M6281 Muscle weakness (generalized): Secondary | ICD-10-CM

## 2021-04-06 NOTE — Therapy (Signed)
[facility] [place], [place], [address] Phone: [phone]   Fax:  [phone]  Physical Therapy Treatment  Patient Details  Name: John Wilson MRN: [id number] Date of Birth: 1945-11-26 Referring Provider (PT): Gaynelle Arabian MD   Encounter Date: 04/06/2021   PT End of Session - 04/06/21 0825    Visit Number 9    Number of Visits 12    Date for PT Re-Evaluation 04/08/21    Authorization Type FOTO AT LEAST EVERY 5TH VISIT.  PROGRESS NOTE AT 10TH VISIT.  KX MODIFIER AFTER 15 VISITS.    PT Start Time 0815    PT Stop Time 0910    PT Time Calculation (min) 55 min           Past Medical History:  Diagnosis Date  . Arthritis   . CAD (coronary artery disease)   . Diabetes ([place])   . ED (erectile dysfunction)   . Gout   . Heart murmur   . Hyperlipidemia   . Hypertension   . Obesity   . Vertigo     Past Surgical History:  Procedure Laterality Date  . CHOLECYSTECTOMY  1981  . CORONARY ANGIOPLASTY WITH STENT PLACEMENT  2001   2 separate surgeries, 1 month apart  . HERNIA REPAIR  9794   Umbilical  . LEFT HEART CATH AND CORONARY ANGIOGRAPHY N/A 03/16/2020   Procedure: LEFT HEART CATH AND CORONARY ANGIOGRAPHY;  Surgeon: Troy Sine, MD;  Location: [place] [facility];  Service: Cardiovascular;  Laterality: N/A;  . PILONIDAL CYST EXCISION  1979  . TOTAL KNEE ARTHROPLASTY Left 03/08/2021   Procedure: TOTAL KNEE ARTHROPLASTY;  Surgeon: Gaynelle Arabian, MD;  Location: [facility];  Service: Orthopedics;  Laterality: Left;  49min    There were no vitals filed for this visit.   Subjective Assessment - 04/06/21 0823    Subjective COVID-19 screen performed prior to patient entering clinic.  Rt knee cap is popping. To MD next week    Patient is accompained by: Family member    Pertinent History Coronary angioplasty with stent placement, OA, CAD, DM, HTN, hernia repair, h/o LBP.    How long can you walk comfortably? Around house  with a FWW.    Patient Stated Goals Perform ADL's without left knee pain.    Currently in Pain? Yes    Pain Score 3     Pain Location Knee    Pain Orientation Left    Pain Descriptors / Indicators Tightness    Pain Type Surgical pain    Pain Onset 1 to 4 weeks ago                             [facility] Adult PT Treatment/Exercise - 04/06/21 0001      Exercises   Exercises Knee/Hip      Knee/Hip Exercises: Aerobic   Nustep Level 5 x 15 minutes moving seat forward seat 10, to increase knee flexion.      Knee/Hip Exercises: Standing   Theraband Level (Terminal Knee Extension) --   XTS blue 2x10 hold 5 secs   Step Down Left;3 sets;10 reps;Step Height: 4";Hand Hold: 2    SLS --   Discussed for HEP   Other Standing Knee Exercises Rock-backs on LT heel for VMO activaionx10      Knee/Hip Exercises: Seated   Long Arc Quad Strengthening;Left;10 reps;2 sets   4#  pause at top for 5 secs  PT Long Term Goals - 03/11/21 1748      PT LONG TERM GOAL #1   Title Independent with a HEP.    Time 4    Period Weeks    Status New      PT LONG TERM GOAL #2   Title Full active left knee extension in order to normalize gait.    Time 4    Period Weeks    Status New      PT LONG TERM GOAL #3   Title Active knee flexion to 115 degrees+ so the patient can perform functional tasks and do so with pain not > 2-3/10.    Time 4    Period Weeks    Status New      PT LONG TERM GOAL #4   Title Decrease edema to within 2.5 cms of non-affected side to assist with pain reduction and range of motion gains.    Time 4    Period Weeks    Status New      PT LONG TERM GOAL #5   Title Increase right hip and knee strength to a solid 4+/5 to provide good stability for accomplishment of functional activities.    Time 4    Period Weeks    Status New      PT LONG TERM GOAL #6   Title Perform a reciprocating stair gait with one railing with pain not > 2-3/10.     Time 4    Period Weeks    Status New                 Plan - 04/06/21 4403    Clinical Impression Statement Pt arrived today reporting that his LT patella intermittently pops still. Rx focused on VMO/ quad activation exs in standing f/b LAQ's for hold times. PROM was 105 degrees today. No popping during Rx  Vaso tolerated well.    Personal Factors and Comorbidities Comorbidity 1;Comorbidity 3+;Comorbidity 2;Other    Comorbidities Coronary angioplasty with stent placement, OA, CAD, DM, HTN, hernia repair, h/o LBP.    Examination-Activity Limitations Other;Locomotion Level;Stairs    Stability/Clinical Decision Making Stable/Uncomplicated    Rehab Potential Excellent    PT Frequency 3x / week    PT Duration 4 weeks    PT Treatment/Interventions ADLs/Self Care Home Management;Cryotherapy;Electrical Stimulation;Gait training;Stair training;Functional mobility training;Therapeutic activities;Therapeutic exercise;Neuromuscular re-education;Manual techniques;Patient/family education;Passive range of motion;Vasopneumatic Device    PT Next Visit Plan Nustep, left knee PROM, VMS to left quadriceps, progress per TKA protocol.  Vaso/E'Stim.    Consulted and Agree with Plan of Care Patient           Patient will benefit from skilled therapeutic intervention in order to improve the following deficits and impairments:  Pain,Abnormal gait,Decreased activity tolerance,Decreased range of motion,Decreased strength,Increased edema  Visit Diagnosis: Chronic pain of left knee  Stiffness of left knee, not elsewhere classified  Muscle weakness (generalized)  Localized edema     Problem List Patient Active Problem List   Diagnosis Date Noted  . OA (osteoarthritis) of knee 03/08/2021  . Primary osteoarthritis of left knee 03/08/2021  . Abdominal aortic ectasia ([place]) 12/31/2020  . Constipation 12/31/2020  . Idiopathic gout 12/31/2020  . Mixed hyperlipidemia 12/31/2020  . Morbid obesity  ([place]) 12/31/2020  . Personal history of colonic polyps 12/31/2020  . Polyneuropathy in diseases classified elsewhere ([place]) 12/31/2020  . Type 2 diabetes mellitus with other circulatory complications ([place]) 47/42/5956  . Diabetes mellitus with coincident hypertension ([place]) 09/16/2019  .  Coronary artery disease involving native coronary artery of native heart without angina pectoris 09/16/2019  . Pure hypercholesterolemia 09/16/2019  . Incarcerated umbilical hernia 27/61/4709    Nyashia Raney,CHRIS, PTA 04/06/2021, 9:21 AM  [facility] [place], [place], [address] Phone: [phone]   Fax:  [phone]  Name: Jacen Carlini MRN: [id number] Date of Birth: 1946/03/14

## 2021-04-08 ENCOUNTER — Other Ambulatory Visit: Payer: Self-pay

## 2021-04-08 ENCOUNTER — Ambulatory Visit: Payer: Medicare HMO | Admitting: *Deleted

## 2021-04-08 DIAGNOSIS — R600 Localized edema: Secondary | ICD-10-CM | POA: Diagnosis not present

## 2021-04-08 DIAGNOSIS — M6281 Muscle weakness (generalized): Secondary | ICD-10-CM

## 2021-04-08 DIAGNOSIS — M25662 Stiffness of left knee, not elsewhere classified: Secondary | ICD-10-CM

## 2021-04-08 DIAGNOSIS — G8929 Other chronic pain: Secondary | ICD-10-CM | POA: Diagnosis not present

## 2021-04-08 DIAGNOSIS — M25562 Pain in left knee: Secondary | ICD-10-CM | POA: Diagnosis not present

## 2021-04-08 NOTE — Therapy (Signed)
[facility] [place], [place], [address] Phone: [phone]   Fax:  [phone]  Physical Therapy Treatment  Patient Details  Name: John Wilson MRN: [id number] Date of Birth: 1946-01-07 Referring Provider (PT): Gaynelle Arabian MD   Encounter Date: 04/08/2021   PT End of Session - 04/08/21 0820    Visit Number 10    Number of Visits 12    Authorization Type FOTO AT LEAST EVERY 5TH VISIT.  PROGRESS NOTE AT 10TH VISIT.  KX MODIFIER AFTER 15 VISITS.   10th visit FOTO 39% limited    PT Start Time 0815    PT Stop Time 0910    PT Time Calculation (min) 55 min           Past Medical History:  Diagnosis Date  . Arthritis   . CAD (coronary artery disease)   . Diabetes ([place])   . ED (erectile dysfunction)   . Gout   . Heart murmur   . Hyperlipidemia   . Hypertension   . Obesity   . Vertigo     Past Surgical History:  Procedure Laterality Date  . CHOLECYSTECTOMY  1981  . CORONARY ANGIOPLASTY WITH STENT PLACEMENT  2001   2 separate surgeries, 1 month apart  . HERNIA REPAIR  [id number]   Umbilical  . LEFT HEART CATH AND CORONARY ANGIOGRAPHY N/A 03/16/2020   Procedure: LEFT HEART CATH AND CORONARY ANGIOGRAPHY;  Surgeon: Troy Sine, MD;  Location: [facility];  Service: Cardiovascular;  Laterality: N/A;  . PILONIDAL CYST EXCISION  1979  . TOTAL KNEE ARTHROPLASTY Left 03/08/2021   Procedure: TOTAL KNEE ARTHROPLASTY;  Surgeon: Gaynelle Arabian, MD;  Location: [facility];  Service: Orthopedics;  Laterality: Left;  37min    There were no vitals filed for this visit.   Subjective Assessment - 04/08/21 0819    Subjective COVID-19 screen performed prior to patient entering clinic.  Rt knee cap is popping still . To MD next Tuesday                             [facility] Adult PT Treatment/Exercise - 04/08/21 0001      Exercises   Exercises Knee/Hip      Knee/Hip Exercises: Aerobic   Nustep Level 5 x 15 minutes  moving seat forward seat 10, 9 , to increase knee flexion.      Knee/Hip Exercises: Standing   Theraband Level (Terminal Knee Extension) --   XTS blue 2x10 hold 5 secs   Lateral Step Up 2 sets;Hand Hold: 2    Forward Step Up Left;2 sets;Step Height: 6";Hand Hold: 2    Step Down Left;10 reps;Step Height: 4";Hand Hold: 2;2 sets    SLS LT SLS x 3 mins total      Knee/Hip Exercises: Seated   Long Arc Quad Strengthening;Left;10 reps;2 sets   4#  pause at top for 5 secs     Modalities   Modalities Vasopneumatic      Vasopneumatic   Number Minutes Vasopneumatic  15 minutes    Vasopnuematic Location  Knee    Vasopneumatic Pressure Medium    Vasopneumatic Temperature  34/pain and edema                       PT Long Term Goals - 03/11/21 1748      PT LONG TERM GOAL #1   Title Independent with a HEP.  Time 4    Period Weeks    Status New      PT LONG TERM GOAL #2   Title Full active left knee extension in order to normalize gait.    Time 4    Period Weeks    Status New      PT LONG TERM GOAL #3   Title Active knee flexion to 115 degrees+ so the patient can perform functional tasks and do so with pain not > 2-3/10.    Time 4    Period Weeks    Status New      PT LONG TERM GOAL #4   Title Decrease edema to within 2.5 cms of non-affected side to assist with pain reduction and range of motion gains.    Time 4    Period Weeks    Status New      PT LONG TERM GOAL #5   Title Increase right hip and knee strength to a solid 4+/5 to provide good stability for accomplishment of functional activities.    Time 4    Period Weeks    Status New      PT LONG TERM GOAL #6   Title Perform a reciprocating stair gait with one railing with pain not > 2-3/10.    Time 4    Period Weeks    Status New                 Plan - 04/08/21 1610    Clinical Impression Statement Pt arrived doing fairly well with LT knee. Rx focused on RT knee strengthening and balance and  tolerated well. ROM 105 degrees flexion.  To MD next week. Vaso tolerated well.    Personal Factors and Comorbidities Comorbidity 1;Comorbidity 3+;Comorbidity 2;Other    Comorbidities Coronary angioplasty with stent placement, OA, CAD, DM, HTN, hernia repair, h/o LBP.    Examination-Activity Limitations Other;Locomotion Level;Stairs    Examination-Participation Restrictions Other    Rehab Potential Excellent    PT Frequency 3x / week    PT Treatment/Interventions ADLs/Self Care Home Management;Cryotherapy;Electrical Stimulation;Gait training;Stair training;Functional mobility training;Therapeutic activities;Therapeutic exercise;Neuromuscular re-education;Manual techniques;Patient/family education;Passive range of motion;Vasopneumatic Device    PT Next Visit Plan Nustep, left knee PROM, VMS to left quadriceps, progress per TKA protocol.  Vaso/E'Stim.    Consulted and Agree with Plan of Care Patient           Patient will benefit from skilled therapeutic intervention in order to improve the following deficits and impairments:  Pain,Abnormal gait,Decreased activity tolerance,Decreased range of motion,Decreased strength,Increased edema  Visit Diagnosis: Chronic pain of left knee  Stiffness of left knee, not elsewhere classified  Localized edema  Muscle weakness (generalized)     Problem List Patient Active Problem List   Diagnosis Date Noted  . OA (osteoarthritis) of knee 03/08/2021  . Primary osteoarthritis of left knee 03/08/2021  . Abdominal aortic ectasia ([place]) 12/31/2020  . Constipation 12/31/2020  . Idiopathic gout 12/31/2020  . Mixed hyperlipidemia 12/31/2020  . Morbid obesity ([place]) 12/31/2020  . Personal history of colonic polyps 12/31/2020  . Polyneuropathy in diseases classified elsewhere ([place]) 12/31/2020  . Type 2 diabetes mellitus with other circulatory complications ([place]) 96/02/5408  . Diabetes mellitus with coincident hypertension ([place]) 09/16/2019  . Coronary  artery disease involving native coronary artery of native heart without angina pectoris 09/16/2019  . Pure hypercholesterolemia 09/16/2019  . Incarcerated umbilical hernia 81/19/1478    Codee Bloodworth,CHRIS, PTA 04/08/2021, 9:29 AM  [facility] [address]  [place], [place], [address] Phone: [phone]   Fax:  [phone]  Name: John Wilson MRN: [id number] Date of Birth: Aug 22, 1946  Progress Note Reporting Period 03/11/21 to 04/08/21  See note below for Objective Data and Assessment of Progress/Goals.  Patient progressing very well toward goals.    Mali Applegate MPT

## 2021-04-13 ENCOUNTER — Encounter: Payer: Self-pay | Admitting: Cardiology

## 2021-04-13 ENCOUNTER — Ambulatory Visit: Payer: Medicare HMO | Admitting: Cardiology

## 2021-04-13 ENCOUNTER — Other Ambulatory Visit: Payer: Self-pay

## 2021-04-13 VITALS — BP 164/70 | HR 59 | Ht 70.0 in | Wt 254.0 lb

## 2021-04-13 DIAGNOSIS — I950 Idiopathic hypotension: Secondary | ICD-10-CM

## 2021-04-13 DIAGNOSIS — I2510 Atherosclerotic heart disease of native coronary artery without angina pectoris: Secondary | ICD-10-CM

## 2021-04-13 DIAGNOSIS — Z471 Aftercare following joint replacement surgery: Secondary | ICD-10-CM | POA: Diagnosis not present

## 2021-04-13 DIAGNOSIS — Z96652 Presence of left artificial knee joint: Secondary | ICD-10-CM | POA: Diagnosis not present

## 2021-04-13 MED ORDER — AMLODIPINE BESYLATE 5 MG PO TABS: 5.0000 mg | ORAL_TABLET | Freq: Every day | ORAL | 3 refills | Status: DC

## 2021-04-13 NOTE — Progress Notes (Signed)
Cardiology Office Note:    Date:  04/13/2021   ID:  John Wilson, DOB 1946-10-08, MRN [id number]  PCP:  Aura Dials, MD   [facility] Providers Cardiologist:  Candee Furbish, MD     Referring MD: No ref. provider found     History of Present Illness:    John Wilson is a 75 y.o. male here for followup CAD.  Prior visit had occasional low BP, washed out.   Cardiac catheterization 2003-RCA stent placed. Nuclear stress test demonstrated an inferior wall abnormality prior to cath.  Cardiac catheterization 03/2020 resulted in medical management.  He blood pressure has been elevated for the past month however he relates this to his recent left knee replacement and pain medications(Tramadol, Oxycodone, and Robaxin). His blood pressure typically averages. Taking Metoprolol.  BP Readings from Last 3 Encounters:  04/13/21 (!) 164/70  03/09/21 (!) 142/68  02/24/21 (!) 172/75   He has gained some weight since his knee recovery. He denies any exertional chest pain, tightness, or pressure. He has no orthopnea, PND, LE edema, or lightheadedness  Past Medical History:  Diagnosis Date  . Arthritis   . CAD (coronary artery disease)   . Diabetes ([place])   . ED (erectile dysfunction)   . Gout   . Heart murmur   . Hyperlipidemia   . Hypertension   . Obesity   . Vertigo     Past Surgical History:  Procedure Laterality Date  . CHOLECYSTECTOMY  1981  . CORONARY ANGIOPLASTY WITH STENT PLACEMENT  2001   2 separate surgeries, 1 month apart  . HERNIA REPAIR  2706   Umbilical  . LEFT HEART CATH AND CORONARY ANGIOGRAPHY N/A 03/16/2020   Procedure: LEFT HEART CATH AND CORONARY ANGIOGRAPHY;  Surgeon: Troy Sine, MD;  Location: [facility];  Service: Cardiovascular;  Laterality: N/A;  . PILONIDAL CYST EXCISION  1979  . TOTAL KNEE ARTHROPLASTY Left 03/08/2021   Procedure: TOTAL KNEE ARTHROPLASTY;  Surgeon: Gaynelle Arabian, MD;  Location: [facility];  Service: Orthopedics;  Laterality:  Left;  34min    Current Medications: Current Meds  Medication Sig  . amLODipine (NORVASC) 2.5 MG tablet Take 1 tablet (2.5 mg total) by mouth daily.  Marland Kitchen atorvastatin (LIPITOR) 80 MG tablet Take 1 tablet (80 mg total) by mouth daily.  Marland Kitchen gabapentin (NEURONTIN) 300 MG capsule Take a 300 mg capsule three times a day for two weeks following surgery.Then take a 300 mg capsule two times a day for two weeks. Then take a 300 mg capsule once a day for two weeks. Then discontinue.  . hydrochlorothiazide (HYDRODIURIL) 25 MG tablet Take 1 tablet (25 mg total) by mouth daily.  . isosorbide mononitrate (IMDUR) 30 MG 24 hr tablet Take 1 tablet (30 mg total) by mouth daily.  Marland Kitchen losartan (COZAAR) 100 MG tablet Take 1 tablet (100 mg total) by mouth daily.  . metFORMIN (GLUCOPHAGE-XR) 500 MG 24 hr tablet Take 1,000 mg by mouth daily with breakfast.  . methocarbamol (ROBAXIN) 500 MG tablet Take 1 tablet (500 mg total) by mouth every 6 (six) hours as needed for muscle spasms.  . metoprolol succinate (TOPROL-XL) 50 MG 24 hr tablet Take 50 mg by mouth daily. Take with or immediately following a meal.  . nitroGLYCERIN (NITROSTAT) 0.4 MG SL tablet Place 0.4 mg under the tongue every 5 (five) minutes as needed for chest pain.  Marland Kitchen oxyCODONE (OXY IR/ROXICODONE) 5 MG immediate release tablet Take 1-2 tablets (5-10 mg total) by mouth every  6 (six) hours as needed for severe pain. Not to exceed 6 tablets a day.  . rivaroxaban (XARELTO) 10 MG TABS tablet Take 1 tablet (10 mg total) by mouth daily with breakfast for 20 days. Then resume one 81 mg aspirin once a day.  . traMADol (ULTRAM) 50 MG tablet Take 1-2 tablets (50-100 mg total) by mouth every 6 (six) hours as needed for moderate pain.     Allergies:   Sulfa antibiotics   Social History   Socioeconomic History  . Marital status: Married    Spouse name: Not on file  . Number of children: Not on file  . Years of education: Not on file  . Highest education level: Not on  file  Occupational History  . Not on file  Tobacco Use  . Smoking status: Former Smoker    Packs/day: 2.00    Years: 14.00    Pack years: 28.00    Types: Cigarettes    Quit date: 11/14/1982    Years since quitting: 38.4  . Smokeless tobacco: Never Used  . Tobacco comment: started age 31  Vaping Use  . Vaping Use: Never used  Substance and Sexual Activity  . Alcohol use: No  . Drug use: No  . Sexual activity: Not on file  Other Topics Concern  . Not on file  Social History Narrative  . Not on file   Social Determinants of Health   Financial Resource Strain: Not on file  Food Insecurity: Not on file  Transportation Needs: Not on file  Physical Activity: Not on file  Stress: Not on file  Social Connections: Not on file     Family History: The patient's family history includes Sick sinus syndrome in his mother.  ROS:   Please see the history of present illness.  All other systems reviewed and are negative.  EKGs/Labs/Other Studies Reviewed:    The following studies were reviewed today:  Cath 03/2020: Diagnostic Dominance: Right    EKG:   04/13/21-Sinus bradycardia, left axis deviation,  rate 69  Recent Labs: 02/24/2021: ALT 28 03/09/2021: BUN 21; Creatinine, Ser 1.12; Hemoglobin 11.4; Platelets 112; Potassium 3.4; Sodium 137  Recent Lipid Panel No results found for: CHOL, TRIG, HDL, CHOLHDL, VLDL, LDLCALC, LDLDIRECT   Risk Assessment/Calculations:      Physical Exam:    VS:  BP (!) 164/70 (BP Location: Left Arm, Patient Position: Sitting, Cuff Size: Normal)   Pulse (!) 59   Ht 5\' 10"  (1.778 m)   Wt 254 lb (115.2 kg)   SpO2 96%   BMI 36.45 kg/m     Wt Readings from Last 3 Encounters:  04/13/21 254 lb (115.2 kg)  03/08/21 252 lb (114.3 kg)  02/24/21 252 lb (114.3 kg)     GEN: Well nourished, well developed in no acute distress HEENT: Normal NECK: No JVD; No carotid bruits LYMPHATICS: No lymphadenopathy CARDIAC:  Bradycardia, no murmurs, rubs,  gallops RESPIRATORY:  Clear to auscultation without rales, wheezing or rhonchi  ABDOMEN: Soft, non-tender, non-distended MUSCULOSKELETAL:  No edema; No deformity  SKIN: Warm and dry NEUROLOGIC:  Alert and oriented x 3 PSYCHIATRIC:  Normal affect   ASSESSMENT:    1. Coronary artery disease involving native coronary artery of native heart without angina pectoris   2. Idiopathic hypotension    PLAN:    In order of problems listed above:  CAD  - Cath reviewed as above. RCA distal 70% medical mgt.   - GDMT. No angina  Hyperlipiidemia  -  LDL goal less than 70.  Previously was 91.  Continuing with high intensity statin therapy for medical management.  No myalgias.  Hypotension  - prior visit decreased losartan to 50, continued with HCTZ given his edema.  Now he is having hypertension post knee replacement.  180s at times at home.  This worries him.  We will go ahead and increase his amlodipine to 5 mg.  We will also continue with his Toprol at 50 at this time.  Once again, prior to his knee replacement at times his blood pressures were in the 24O systolic.  Likely related to inflammatory response.  Also watch weight.  He has gained about 6 to 7 pounds since surgery.        Medication Adjustments/Labs and Tests Ordered: Current medicines are reviewed at length with the patient today.  Concerns regarding medicines are outlined above.  No orders of the defined types were placed in this encounter.  No orders of the defined types were placed in this encounter.   There are no Patient Instructions on file for this visit.    I,Alexis Bryant,acting as a Education administrator for [employer], MD.,have documented all relevant documentation on the behalf of Candee Furbish, MD,as directed by  Candee Furbish, MD while in the presence of Candee Furbish, MD.  I, Candee Furbish, MD, have reviewed all documentation for this visit. The documentation on 04/13/21 for the exam, diagnosis, procedures, and orders are all accurate  and complete.   Signed, Candee Furbish, MD  04/13/2021 9:22 AM    [facility]

## 2021-04-13 NOTE — Patient Instructions (Signed)
Medication Instructions:  Please increase your Amlodipine to 5 mg daily. Continue all other medications as listed.  *If you need a refill on your cardiac medications before your next appointment, please call your pharmacy*  You have been referred to the Hypertension Clinic here for management of your blood pressure.  Follow-Up: At [facility], you and your health needs are our priority.  As part of our continuing mission to provide you with exceptional heart care, we have created designated Provider Care Teams.  These Care Teams include your primary Cardiologist (physician) and Advanced Practice Providers (APPs -  Physician Assistants and Nurse Practitioners) who all work together to provide you with the care you need, when you need it.  We recommend signing up for the patient portal called "MyChart".  Sign up information is provided on this After Visit Summary.  MyChart is used to connect with patients for Virtual Visits (Telemedicine).  Patients are able to view lab/test results, encounter notes, upcoming appointments, etc.  Non-urgent messages can be sent to your provider as well.   To learn more about what you can do with MyChart, go to [email or web address].    Your next appointment:   6 month(s)  The format for your next appointment:   In Person  Provider:   Candee Furbish, MD   Thank you for choosing [facility]!!

## 2021-04-13 NOTE — Progress Notes (Deleted)
Cardiology Office Note:    Date:  04/13/2021   ID:  John Wilson, DOB 30-Dec-1945, MRN [id number]  PCP:  Aura Dials, MD   [facility] Providers Cardiologist:  Candee Furbish, MD { Click to update primary MD,subspecialty MD or APP then REFRESH:1}    Referring MD: No ref. provider found   No chief complaint on file. ***  History of Present Illness:    John Wilson is a 75 y.o. male here for followup CAD.  Prior visit had occasional low BP, washed out.   Cardiac catheterization 2003-RCA stent placed. Nuclear stress test demonstrated an inferior wall abnormality prior to cath.  Cardiac catheterization 03/2020 resulted in medical management.   Past Medical History:  Diagnosis Date  . Arthritis   . CAD (coronary artery disease)   . Diabetes ([place])   . ED (erectile dysfunction)   . Gout   . Heart murmur   . Hyperlipidemia   . Hypertension   . Obesity   . Vertigo     Past Surgical History:  Procedure Laterality Date  . CHOLECYSTECTOMY  1981  . CORONARY ANGIOPLASTY WITH STENT PLACEMENT  2001   2 separate surgeries, 1 month apart  . HERNIA REPAIR  1884   Umbilical  . LEFT HEART CATH AND CORONARY ANGIOGRAPHY N/A 03/16/2020   Procedure: LEFT HEART CATH AND CORONARY ANGIOGRAPHY;  Surgeon: Troy Sine, MD;  Location: [facility];  Service: Cardiovascular;  Laterality: N/A;  . PILONIDAL CYST EXCISION  1979  . TOTAL KNEE ARTHROPLASTY Left 03/08/2021   Procedure: TOTAL KNEE ARTHROPLASTY;  Surgeon: Gaynelle Arabian, MD;  Location: [facility];  Service: Orthopedics;  Laterality: Left;  58min    Current Medications: No outpatient medications have been marked as taking for the 04/13/21 encounter (Appointment) with Jerline Pain, MD.     Allergies:   Sulfa antibiotics   Social History   Socioeconomic History  . Marital status: Married    Spouse name: Not on file  . Number of children: Not on file  . Years of education: Not on file  . Highest education level: Not  on file  Occupational History  . Not on file  Tobacco Use  . Smoking status: Former Smoker    Packs/day: 2.00    Years: 14.00    Pack years: 28.00    Types: Cigarettes    Quit date: 11/14/1982    Years since quitting: 38.4  . Smokeless tobacco: Never Used  . Tobacco comment: started age 33  Vaping Use  . Vaping Use: Never used  Substance and Sexual Activity  . Alcohol use: No  . Drug use: No  . Sexual activity: Not on file  Other Topics Concern  . Not on file  Social History Narrative  . Not on file   Social Determinants of Health   Financial Resource Strain: Not on file  Food Insecurity: Not on file  Transportation Needs: Not on file  Physical Activity: Not on file  Stress: Not on file  Social Connections: Not on file     Family History: The patient's ***family history includes Sick sinus syndrome in his mother.  ROS:   Please see the history of present illness.    *** All other systems reviewed and are negative.  EKGs/Labs/Other Studies Reviewed:    The following studies were reviewed today:  Cath 03/2020: Diagnostic Dominance: Right    EKG:  EKG is *** ordered today.  The ekg ordered today demonstrates ***  Recent Labs: 02/24/2021:  ALT 28 03/09/2021: BUN 21; Creatinine, Ser 1.12; Hemoglobin 11.4; Platelets 112; Potassium 3.4; Sodium 137  Recent Lipid Panel No results found for: CHOL, TRIG, HDL, CHOLHDL, VLDL, LDLCALC, LDLDIRECT   Risk Assessment/Calculations:   {Does this patient have ATRIAL FIBRILLATION?[phone]}   Physical Exam:    VS:  There were no vitals taken for this visit.    Wt Readings from Last 3 Encounters:  03/08/21 252 lb (114.3 kg)  02/24/21 252 lb (114.3 kg)  10/13/20 253 lb (114.8 kg)     GEN: *** Well nourished, well developed in no acute distress HEENT: Normal NECK: No JVD; No carotid bruits LYMPHATICS: No lymphadenopathy CARDIAC: ***RRR, no murmurs, rubs, gallops RESPIRATORY:  Clear to auscultation without rales,  wheezing or rhonchi  ABDOMEN: Soft, non-tender, non-distended MUSCULOSKELETAL:  No edema; No deformity  SKIN: Warm and dry NEUROLOGIC:  Alert and oriented x 3 PSYCHIATRIC:  Normal affect   ASSESSMENT:    No diagnosis found. PLAN:    In order of problems listed above:  CAD  - Cath reviewed as above. RCA distal 70% medical mgt.   - GDMT. No angina  Hyperlipiidemia  -   Hypotension  - prior visit decreased losartan to 50, continued with HCTZ given his edema    {Are you ordering a CV Procedure (e.g. stress test, cath, DCCV, TEE, etc)?   Press F2        :[id number]}    Medication Adjustments/Labs and Tests Ordered: Current medicines are reviewed at length with the patient today.  Concerns regarding medicines are outlined above.  No orders of the defined types were placed in this encounter.  No orders of the defined types were placed in this encounter.   There are no Patient Instructions on file for this visit.   Signed, Candee Furbish, MD  04/13/2021 6:47 AM    [facility]

## 2021-04-14 ENCOUNTER — Other Ambulatory Visit: Payer: Self-pay

## 2021-04-14 ENCOUNTER — Ambulatory Visit: Payer: Medicare HMO | Attending: Orthopedic Surgery | Admitting: *Deleted

## 2021-04-14 ENCOUNTER — Encounter: Payer: Self-pay | Admitting: *Deleted

## 2021-04-14 DIAGNOSIS — M25662 Stiffness of left knee, not elsewhere classified: Secondary | ICD-10-CM | POA: Diagnosis not present

## 2021-04-14 DIAGNOSIS — M6281 Muscle weakness (generalized): Secondary | ICD-10-CM | POA: Diagnosis not present

## 2021-04-14 DIAGNOSIS — R600 Localized edema: Secondary | ICD-10-CM | POA: Insufficient documentation

## 2021-04-14 DIAGNOSIS — G8929 Other chronic pain: Secondary | ICD-10-CM | POA: Insufficient documentation

## 2021-04-14 DIAGNOSIS — M25562 Pain in left knee: Secondary | ICD-10-CM | POA: Insufficient documentation

## 2021-04-14 NOTE — Addendum Note (Signed)
Addended by: Eoin Willden, [place] W on: 04/14/2021 11:02 AM   Modules accepted: Orders

## 2021-04-14 NOTE — Therapy (Signed)
[facility] [place], [place], [address] Phone: [phone]   Fax:  [phone]  Physical Therapy Treatment  Patient Details  Name: John Wilson MRN: [id number] Date of Birth: Mar 27, 1946 Referring Provider (PT): Gaynelle Arabian MD   Encounter Date: 04/14/2021   PT End of Session - 04/14/21 0824    Visit Number 11    Number of Visits 12    Date for PT Re-Evaluation 04/08/21    Authorization Type FOTO AT LEAST EVERY 5TH VISIT.  PROGRESS NOTE AT 10TH VISIT.  KX MODIFIER AFTER 15 VISITS.   10th visit FOTO 39% limited    PT Start Time 0815    PT Stop Time 0912    PT Time Calculation (min) 57 min           Past Medical History:  Diagnosis Date  . Arthritis   . CAD (coronary artery disease)   . Diabetes ([place])   . ED (erectile dysfunction)   . Gout   . Heart murmur   . Hyperlipidemia   . Hypertension   . Obesity   . Vertigo     Past Surgical History:  Procedure Laterality Date  . CHOLECYSTECTOMY  1981  . CORONARY ANGIOPLASTY WITH STENT PLACEMENT  2001   2 separate surgeries, 1 month apart  . HERNIA REPAIR  5027   Umbilical  . LEFT HEART CATH AND CORONARY ANGIOGRAPHY N/A 03/16/2020   Procedure: LEFT HEART CATH AND CORONARY ANGIOGRAPHY;  Surgeon: Troy Sine, MD;  Location: [facility];  Service: Cardiovascular;  Laterality: N/A;  . PILONIDAL CYST EXCISION  1979  . TOTAL KNEE ARTHROPLASTY Left 03/08/2021   Procedure: TOTAL KNEE ARTHROPLASTY;  Surgeon: Gaynelle Arabian, MD;  Location: [facility];  Service: Orthopedics;  Laterality: Left;  37mn    There were no vitals filed for this visit.   Subjective Assessment - 04/14/21 0822    Subjective COVID-19 screen performed prior to patient entering clinic.  Rt knee cap is popping still . MD was pleased with current status.    Pertinent History Coronary angioplasty with stent placement, OA, CAD, DM, HTN, hernia repair, h/o LBP.    How long can you walk comfortably?  Around house with a FWW.    Patient Stated Goals Perform ADL's without left knee pain.    Currently in Pain? Yes    Pain Score 2     Pain Location Knee    Pain Orientation Left    Pain Descriptors / Indicators Tightness                             [facility] Adult PT Treatment/Exercise - 04/14/21 0001      Exercises   Exercises Knee/Hip      Knee/Hip Exercises: Aerobic   Recumbent Bike Bike L1  seat 9 x 7 mins    Nustep Level 5 x 10 minutes moving seat forward seat 10, 9 , to increase knee flexion.      Knee/Hip Exercises: Machines for Strengthening   Cybex Knee Extension 10# LT knee 2x fatigue      Knee/Hip Exercises: Standing   Heel Raises --    Heel Raises Limitations --    Lateral Step Up --    Forward Step Up Left;2 sets;Step Height: 6";Hand Hold: 2    Step Down --    Rocker Board 3 minutes    SLS LT SLS x 4 mins total  Knee/Hip Exercises: Seated   Long Arc Quad --      Modalities   Modalities Vasopneumatic      Vasopneumatic   Number Minutes Vasopneumatic  10 minutes    Vasopnuematic Location  Knee    Vasopneumatic Pressure Medium    Vasopneumatic Temperature  34/pain and edema                       PT Long Term Goals - 04/14/21 0932      PT LONG TERM GOAL #1   Title Independent with a HEP.    Baseline Met 08/20/20    Time 4    Period Weeks    Status Achieved      PT LONG TERM GOAL #2   Title Full active left knee extension in order to normalize gait.    Time 4    Period Weeks    Status On-going      PT LONG TERM GOAL #3   Title Active knee flexion to 115 degrees+ so the patient can perform functional tasks and do so with pain not > 2-3/10.    Time 4    Status On-going      PT LONG TERM GOAL #4   Title Decrease edema to within 2.5 cms of non-affected side to assist with pain reduction and range of motion gains.    Time 4    Period Weeks    Status On-going      PT LONG TERM GOAL #5   Title Increase right hip and  knee strength to a solid 4+/5 to provide good stability for accomplishment of functional activities.    Period Weeks    Status On-going      PT LONG TERM GOAL #6   Title Perform a reciprocating stair gait with one railing with pain not > 2-3/10.    Time 4    Period Weeks    Status Achieved                 Plan - 04/14/21 0825    Clinical Impression Statement Pt arrived today doing well after MD visit. He was able progress with Exs today with bike and knee extension machine.SLS balance improvement as well. Normal Vaso response today.    Personal Factors and Comorbidities Comorbidity 1;Comorbidity 3+;Comorbidity 2;Other    Comorbidities Coronary angioplasty with stent placement, OA, CAD, DM, HTN, hernia repair, h/o LBP.    Examination-Activity Limitations Other;Locomotion Level;Stairs    Rehab Potential Excellent    PT Frequency 3x / week    PT Duration 4 weeks    PT Treatment/Interventions ADLs/Self Care Home Management;Cryotherapy;Electrical Stimulation;Gait training;Stair training;Functional mobility training;Therapeutic activities;Therapeutic exercise;Neuromuscular re-education;Manual techniques;Patient/family education;Passive range of motion;Vasopneumatic Device    PT Next Visit Plan Nustep, left knee PROM, VMS to left quadriceps, progress per TKA protocol.  Vaso/E'Stim.    Consulted and Agree with Plan of Care Patient           Patient will benefit from skilled therapeutic intervention in order to improve the following deficits and impairments:  Pain,Abnormal gait,Decreased activity tolerance,Decreased range of motion,Decreased strength,Increased edema  Visit Diagnosis: Chronic pain of left knee  Stiffness of left knee, not elsewhere classified  Localized edema  Muscle weakness (generalized)     Problem List Patient Active Problem List   Diagnosis Date Noted  . OA (osteoarthritis) of knee 03/08/2021  . Primary osteoarthritis of left knee 03/08/2021  .  Abdominal aortic ectasia ([place]) 12/31/2020  .  Constipation 12/31/2020  . Idiopathic gout 12/31/2020  . Mixed hyperlipidemia 12/31/2020  . Morbid obesity ([place]) 12/31/2020  . Personal history of colonic polyps 12/31/2020  . Polyneuropathy in diseases classified elsewhere ([place]) 12/31/2020  . Type 2 diabetes mellitus with other circulatory complications ([place]) 82/64/1583  . Diabetes mellitus with coincident hypertension ([place]) 09/16/2019  . Coronary artery disease involving native coronary artery of native heart without angina pectoris 09/16/2019  . Pure hypercholesterolemia 09/16/2019  . Incarcerated umbilical hernia 09/40/7680    Amado Andal,CHRIS, PTA 04/14/2021, 9:36 AM  [facility] [place], [place], [address] Phone: [phone]   Fax:  [phone]  Name: John Wilson MRN: [id number] Date of Birth: [birth year]

## 2021-04-16 ENCOUNTER — Other Ambulatory Visit: Payer: Self-pay

## 2021-04-16 ENCOUNTER — Ambulatory Visit: Payer: Medicare HMO | Admitting: *Deleted

## 2021-04-16 DIAGNOSIS — G8929 Other chronic pain: Secondary | ICD-10-CM

## 2021-04-16 DIAGNOSIS — M6281 Muscle weakness (generalized): Secondary | ICD-10-CM

## 2021-04-16 DIAGNOSIS — R600 Localized edema: Secondary | ICD-10-CM | POA: Diagnosis not present

## 2021-04-16 DIAGNOSIS — M25662 Stiffness of left knee, not elsewhere classified: Secondary | ICD-10-CM

## 2021-04-16 DIAGNOSIS — M25562 Pain in left knee: Secondary | ICD-10-CM | POA: Diagnosis not present

## 2021-04-16 NOTE — Therapy (Signed)
[facility] [place], [place], [address] Phone: [phone]   Fax:  [phone]  Physical Therapy Treatment  Patient Details  Name: John Wilson MRN: [id number] Date of Birth: 02/11/1946 Referring Provider (PT): Gaynelle Arabian MD   Encounter Date: 04/16/2021   PT End of Session - 04/16/21 0815    Visit Number 12    Number of Visits 18    Date for PT Re-Evaluation 05/05/21    Authorization Type FOTO AT LEAST EVERY 5TH VISIT.  PROGRESS NOTE AT 10TH VISIT.  KX MODIFIER AFTER 15 VISITS.   10th visit FOTO 39% limited    PT Start Time 0815    PT Stop Time 0911    PT Time Calculation (min) 56 min           Past Medical History:  Diagnosis Date  . Arthritis   . CAD (coronary artery disease)   . Diabetes ([place])   . ED (erectile dysfunction)   . Gout   . Heart murmur   . Hyperlipidemia   . Hypertension   . Obesity   . Vertigo     Past Surgical History:  Procedure Laterality Date  . CHOLECYSTECTOMY  1981  . CORONARY ANGIOPLASTY WITH STENT PLACEMENT  2001   2 separate surgeries, 1 month apart  . HERNIA REPAIR  6962   Umbilical  . LEFT HEART CATH AND CORONARY ANGIOGRAPHY N/A 03/16/2020   Procedure: LEFT HEART CATH AND CORONARY ANGIOGRAPHY;  Surgeon: Troy Sine, MD;  Location: [facility];  Service: Cardiovascular;  Laterality: N/A;  . PILONIDAL CYST EXCISION  1979  . TOTAL KNEE ARTHROPLASTY Left 03/08/2021   Procedure: TOTAL KNEE ARTHROPLASTY;  Surgeon: Gaynelle Arabian, MD;  Location: [facility];  Service: Orthopedics;  Laterality: Left;  38mn    There were no vitals filed for this visit.   Subjective Assessment - 04/16/21 0814    Subjective COVID-19 screen performed prior to patient entering clinic.  Rt knee cap is popping still . Doing good    Pertinent History Coronary angioplasty with stent placement, OA, CAD, DM, HTN, hernia repair, h/o LBP.    How long can you walk comfortably? Around house with a FWW.     Patient Stated Goals Perform ADL's without left knee pain.    Currently in Pain? Yes    Pain Score 2     Pain Location Knee    Pain Orientation Left    Pain Descriptors / Indicators Tightness    Pain Type Surgical pain    Pain Onset 1 to 4 weeks ago                             [facility] 04/16/21 0001      Exercises   Exercises Knee/Hip      Knee/Hip Exercises: Aerobic   Recumbent Bike Bike L1  seat 10 x12  mins    Nustep Level 5 x 5 minutes moving seat forward seat 10, 9 , to increase knee flexion.      Knee/Hip Exercises: Machines for Strengthening   Cybex Knee Extension 10# LT knee 3x fatigue      Knee/Hip Exercises: Standing   Forward Step Up Left;2 sets;Step Height: 6";Hand Hold: 2    Rocker Board 3 minutes    SLS LT SLS x 4 mins total      Modalities   Modalities Vasopneumatic  Vasopneumatic   Number Minutes Vasopneumatic  10 minutes    Vasopnuematic Location  Knee    Vasopneumatic Pressure Medium    Vasopneumatic Temperature  34/pain and edema      Manual Therapy   Manual therapy comments --                       PT Long Term Goals - 04/14/21 0932      PT LONG TERM GOAL #1   Title Independent with a HEP.    Baseline Met 08/20/20    Time 4    Period Weeks    Status Achieved      PT LONG TERM GOAL #2   Title Full active left knee extension in order to normalize gait.    Time 4    Period Weeks    Status On-going      PT LONG TERM GOAL #3   Title Active knee flexion to 115 degrees+ so the patient can perform functional tasks and do so with pain not > 2-3/10.    Time 4    Status On-going      PT LONG TERM GOAL #4   Title Decrease edema to within 2.5 cms of non-affected side to assist with pain reduction and range of motion gains.    Time 4    Period Weeks    Status On-going      PT LONG TERM GOAL #5   Title Increase right hip and knee strength to a solid 4+/5 to provide good stability for  accomplishment of functional activities.    Period Weeks    Status On-going      PT LONG TERM GOAL #6   Title Perform a reciprocating stair gait with one railing with pain not > 2-3/10.    Time 4    Period Weeks    Status Achieved                 Plan - 04/16/21 1005    Clinical Impression Statement Pt arrived today with increased soreness in LT knee due to being up on it a lot yesterday. He was still able to perform therex and act's , but with increased soreness/ stiffness. Normal Vaso response and tolerated well    Personal Factors and Comorbidities Comorbidity 1;Comorbidity 3+;Comorbidity 2;Other    Comorbidities Coronary angioplasty with stent placement, OA, CAD, DM, HTN, hernia repair, h/o LBP.    Stability/Clinical Decision Making Stable/Uncomplicated    Rehab Potential Excellent    PT Frequency 3x / week    PT Treatment/Interventions ADLs/Self Care Home Management;Cryotherapy;Electrical Stimulation;Gait training;Stair training;Functional mobility training;Therapeutic activities;Therapeutic exercise;Neuromuscular re-education;Manual techniques;Patient/family education;Passive range of motion;Vasopneumatic Device    PT Next Visit Plan Nustep, left knee PROM, progress per TKA protocol.  Vaso           Patient will benefit from skilled therapeutic intervention in order to improve the following deficits and impairments:  Pain,Abnormal gait,Decreased activity tolerance,Decreased range of motion,Decreased strength,Increased edema  Visit Diagnosis: Chronic pain of left knee  Stiffness of left knee, not elsewhere classified  Localized edema  Muscle weakness (generalized)     Problem List Patient Active Problem List   Diagnosis Date Noted  . OA (osteoarthritis) of knee 03/08/2021  . Primary osteoarthritis of left knee 03/08/2021  . Abdominal aortic ectasia ([place]) 12/31/2020  . Constipation 12/31/2020  . Idiopathic gout 12/31/2020  . Mixed hyperlipidemia 12/31/2020   . Morbid obesity ([place]) 12/31/2020  . Personal history of  colonic polyps 12/31/2020  . Polyneuropathy in diseases classified elsewhere ([place]) 12/31/2020  . Type 2 diabetes mellitus with other circulatory complications ([place]) 40/97/3532  . Diabetes mellitus with coincident hypertension ([place]) 09/16/2019  . Coronary artery disease involving native coronary artery of native heart without angina pectoris 09/16/2019  . Pure hypercholesterolemia 09/16/2019  . Incarcerated umbilical hernia 99/24/2683    Worley Radermacher,CHRIS, PTA 04/16/2021, 10:08 AM  [facility] [place], [place], [address] Phone: [phone]   Fax:  [phone]  Name: Yazid Pop MRN: [id number] Date of Birth: 02-03-1946

## 2021-04-20 ENCOUNTER — Other Ambulatory Visit: Payer: Self-pay

## 2021-04-20 ENCOUNTER — Ambulatory Visit: Payer: Medicare HMO | Admitting: Physical Therapy

## 2021-04-20 DIAGNOSIS — R600 Localized edema: Secondary | ICD-10-CM

## 2021-04-20 DIAGNOSIS — M25662 Stiffness of left knee, not elsewhere classified: Secondary | ICD-10-CM | POA: Diagnosis not present

## 2021-04-20 DIAGNOSIS — M25562 Pain in left knee: Secondary | ICD-10-CM | POA: Diagnosis not present

## 2021-04-20 DIAGNOSIS — G8929 Other chronic pain: Secondary | ICD-10-CM

## 2021-04-20 DIAGNOSIS — M6281 Muscle weakness (generalized): Secondary | ICD-10-CM | POA: Diagnosis not present

## 2021-04-20 NOTE — Therapy (Signed)
[facility] [place], [place], [address] Phone: [phone]   Fax:  [phone]  Physical Therapy Treatment  Patient Details  Name: John Wilson MRN: [id number] Date of Birth: 1946/10/06 Referring Provider (PT): Gaynelle Arabian MD   Encounter Date: 04/20/2021   PT End of Session - 04/20/21 0850    Visit Number 13    Number of Visits 18    Date for PT Re-Evaluation 05/05/21    Authorization Type FOTO AT LEAST EVERY 5TH VISIT.  PROGRESS NOTE AT 10TH VISIT.  KX MODIFIER AFTER 15 VISITS.   10th visit FOTO 39% limited    PT Start Time 0815    PT Stop Time 0903    PT Time Calculation (min) 48 min    Activity Tolerance Patient tolerated treatment well    Behavior During Therapy [facility] for tasks assessed/performed           Past Medical History:  Diagnosis Date  . Arthritis   . CAD (coronary artery disease)   . Diabetes ([place])   . ED (erectile dysfunction)   . Gout   . Heart murmur   . Hyperlipidemia   . Hypertension   . Obesity   . Vertigo     Past Surgical History:  Procedure Laterality Date  . CHOLECYSTECTOMY  1981  . CORONARY ANGIOPLASTY WITH STENT PLACEMENT  2001   2 separate surgeries, 1 month apart  . HERNIA REPAIR  3825   Umbilical  . LEFT HEART CATH AND CORONARY ANGIOGRAPHY N/A 03/16/2020   Procedure: LEFT HEART CATH AND CORONARY ANGIOGRAPHY;  Surgeon: Troy Sine, MD;  Location: [facility];  Service: Cardiovascular;  Laterality: N/A;  . PILONIDAL CYST EXCISION  1979  . TOTAL KNEE ARTHROPLASTY Left 03/08/2021   Procedure: TOTAL KNEE ARTHROPLASTY;  Surgeon: Gaynelle Arabian, MD;  [facility]: [facility];  Service: Orthopedics;  Laterality: Left;  80mn    There were no vitals filed for this visit.   Subjective Assessment - 04/20/21 0849    Subjective COVID-19 screen performed prior to patient entering clinic.  Doing well.  [place]ed [place] my hunting [place] over the weekend, cut trees etc...    Patient is  accompained by: Family member    Patient Stated Goals Perform ADL's without left knee pain.    Pain Score 2     Pain Orientation Left    Pain Descriptors / Indicators Tightness    Pain Onset More than a month ago                             [facility] 04/20/21 0001      Exercises   Exercises Knee/Hip      Knee/Hip Exercises: Aerobic   Recumbent Bike Level 2 x 10 minutes up to seat 9.    Nustep Level 5 x 5 minutes.      Knee/Hip Exercises: Machines for Strengthening   Cybex Knee Extension 10# x 3 minutes.    Cybex Knee Flexion 30# x 3 minutes.    Cybex Leg Press 2 1/2 plates x 3 minutes.      Modalities   Modalities Vasopneumatic      Vasopneumatic   Number Minutes Vasopneumatic  15 minutes    Vasopnuematic Location  --   Left knee.   Vasopneumatic Pressure Medium  PT Long Term Goals - 04/14/21 0932      PT LONG TERM GOAL #1   Title Independent with a HEP.    Baseline Met 08/20/20    Time 4    Period Weeks    Status Achieved      PT LONG TERM GOAL #2   Title Full active left knee extension in order to normalize gait.    Time 4    Period Weeks    Status On-going      PT LONG TERM GOAL #3   Title Active knee flexion to 115 degrees+ so the patient can perform functional tasks and do so with pain not > 2-3/10.    Time 4    Status On-going      PT LONG TERM GOAL #4   Title Decrease edema to within 2.5 cms of non-affected side to assist with pain reduction and range of motion gains.    Time 4    Period Weeks    Status On-going      PT LONG TERM GOAL #5   Title Increase right hip and knee strength to a solid 4+/5 to provide good stability for accomplishment of functional activities.    Period Weeks    Status On-going      PT LONG TERM GOAL #6   Title Perform a reciprocating stair gait with one railing with pain not > 2-3/10.    Time 4    Period Weeks    Status Achieved                  Plan - 04/20/21 0855    Clinical Impression Statement The patient is doing very well.  He tolerated the addition of ham curls and leg press without complaint.  He had a very busy weekend on his hunting land and did well.    Personal Factors and Comorbidities Comorbidity 1;Comorbidity 3+;Comorbidity 2;Other    Comorbidities Coronary angioplasty with stent placement, OA, CAD, DM, HTN, hernia repair, h/o LBP.    Examination-Activity Limitations Other;Locomotion Level;Stairs    Examination-Participation Restrictions Other    Stability/Clinical Decision Making Stable/Uncomplicated    Rehab Potential Excellent    PT Frequency 3x / week    PT Duration 4 weeks    PT Treatment/Interventions ADLs/Self Care Home Management;Cryotherapy;Electrical Stimulation;Gait training;Stair training;Functional mobility training;Therapeutic activities;Therapeutic exercise;Neuromuscular re-education;Manual techniques;Patient/family education;Passive range of motion;Vasopneumatic Device    PT Next Visit Plan Nustep, left knee PROM, progress per TKA protocol.  Vaso           Patient will benefit from skilled therapeutic intervention in order to improve the following deficits and impairments:     Visit Diagnosis: Chronic pain of left knee  Stiffness of left knee, not elsewhere classified  Localized edema     Problem List Patient Active Problem List   Diagnosis Date Noted  . OA (osteoarthritis) of knee 03/08/2021  . Primary osteoarthritis of left knee 03/08/2021  . Abdominal aortic ectasia ([place]) 12/31/2020  . Constipation 12/31/2020  . Idiopathic gout 12/31/2020  . Mixed hyperlipidemia 12/31/2020  . Morbid obesity ([place]) 12/31/2020  . Personal history of colonic polyps 12/31/2020  . Polyneuropathy in diseases classified elsewhere ([place]) 12/31/2020  . Type 2 diabetes mellitus with other circulatory complications ([place]) 57/26/2035  . Diabetes mellitus with coincident hypertension ([place])  09/16/2019  . Coronary artery disease involving native coronary artery of native heart without angina pectoris 09/16/2019  . Pure hypercholesterolemia 09/16/2019  . Incarcerated umbilical hernia 59/74/1638  Urie Loughner, [name] 04/20/2021, 9:27 AM  [facility] [address] [place], [place], [address] Phone: [phone]   Fax:  [phone]  Name: John Wilson MRN: [id number] Date of Birth: Mar 24, 1946

## 2021-04-22 ENCOUNTER — Other Ambulatory Visit: Payer: Self-pay

## 2021-04-22 ENCOUNTER — Ambulatory Visit: Payer: Medicare HMO | Admitting: *Deleted

## 2021-04-22 DIAGNOSIS — R600 Localized edema: Secondary | ICD-10-CM | POA: Diagnosis not present

## 2021-04-22 DIAGNOSIS — M25662 Stiffness of left knee, not elsewhere classified: Secondary | ICD-10-CM | POA: Diagnosis not present

## 2021-04-22 DIAGNOSIS — G8929 Other chronic pain: Secondary | ICD-10-CM | POA: Diagnosis not present

## 2021-04-22 DIAGNOSIS — M6281 Muscle weakness (generalized): Secondary | ICD-10-CM | POA: Diagnosis not present

## 2021-04-22 DIAGNOSIS — M25562 Pain in left knee: Secondary | ICD-10-CM | POA: Diagnosis not present

## 2021-04-22 NOTE — Therapy (Signed)
[facility] [place], [place], [address] Phone: [phone]   Fax:  [phone]  Physical Therapy Treatment  Patient Details  Name: John Wilson MRN: [id number] Date of Birth: Oct 07, 1946 Referring Provider (PT): Gaynelle Arabian MD   Encounter Date: 04/22/2021   PT End of Session - 04/22/21 1019     Visit Number 14    Number of Visits 18    Date for PT Re-Evaluation 05/05/21    Authorization Type FOTO AT LEAST EVERY 5TH VISIT.  PROGRESS NOTE AT 10TH VISIT.  KX MODIFIER AFTER 15 VISITS.   10th visit FOTO 39% limited    PT Start Time 0815    PT Stop Time 0912    PT Time Calculation (min) 57 min             Past Medical History:  Diagnosis Date   Arthritis    CAD (coronary artery disease)    Diabetes ([place])    ED (erectile dysfunction)    Gout    Heart murmur    Hyperlipidemia    Hypertension    Obesity    Vertigo     Past Surgical History:  Procedure Laterality Date   [place] WITH STENT PLACEMENT  2001   2 separate surgeries, 1 month apart   HERNIA REPAIR  [id number]   Umbilical   LEFT HEART CATH AND CORONARY ANGIOGRAPHY N/A 03/16/2020   Procedure: LEFT HEART CATH AND CORONARY ANGIOGRAPHY;  Surgeon: Troy Sine, MD;  Location: [facility];  Service: Cardiovascular;  Laterality: N/A;   PILONIDAL CYST EXCISION  1979   TOTAL KNEE ARTHROPLASTY Left 03/08/2021   Procedure: TOTAL KNEE ARTHROPLASTY;  Surgeon: Gaynelle Arabian, MD;  Location: [facility];  Service: Orthopedics;  Laterality: Left;  3mn    There were no vitals filed for this visit.   Subjective Assessment - 04/22/21 0838     Subjective COVID-19 screen performed prior to patient entering clinic.  Doing well. RT knee is tight    Pertinent History Coronary angioplasty with stent placement, OA, CAD, DM, HTN, hernia repair, h/o LBP.    How long can you walk comfortably? Around house with a FWW.                                [facility] PT Treatment/Exercise - 04/22/21 0001       Exercises   Exercises Knee/Hip      Knee/Hip Exercises: Aerobic   Recumbent Bike Level 2 x 12 minutes up to seat 9.    Nustep Level 5 x 5 minutes.      Knee/Hip Exercises: Machines for Strengthening   Cybex Knee Extension 10#  3x fatigue    Cybex Knee Flexion 30#   3x fatigue      Knee/Hip Exercises: Standing   SLS LT SLS x 4 mins total      Knee/Hip Exercises: Seated   Sit to Sand with UE support;10 reps   functional pattern practiced and cues to fire glutes to decrease pressure on knees. last 10 were great 4x 10     Modalities   Modalities Vasopneumatic      Vasopneumatic   Number Minutes Vasopneumatic  10 minutes    Vasopnuematic Location  Knee    Vasopneumatic Pressure Medium    Vasopneumatic Temperature  34/pain and edema      Manual Therapy   Manual  Therapy --    Soft tissue mobilization --    Passive ROM --                         PT Long Term Goals - 04/14/21 0932       PT LONG TERM GOAL #1   Title Independent with a HEP.    Baseline Met 08/20/20    Time 4    Period Weeks    Status Achieved      PT LONG TERM GOAL #2   Title Full active left knee extension in order to normalize gait.    Time 4    Period Weeks    Status On-going      PT LONG TERM GOAL #3   Title Active knee flexion to 115 degrees+ so the patient can perform functional tasks and do so with pain not > 2-3/10.    Time 4    Status On-going      PT LONG TERM GOAL #4   Title Decrease edema to within 2.5 cms of non-affected side to assist with pain reduction and range of motion gains.    Time 4    Period Weeks    Status On-going      PT LONG TERM GOAL #5   Title Increase right hip and knee strength to a solid 4+/5 to provide good stability for accomplishment of functional activities.    Period Weeks    Status On-going      PT LONG TERM GOAL #6   Title Perform a reciprocating  stair gait with one railing with pain not > 2-3/10.    Time 4    Period Weeks    Status Achieved                   Plan - 04/22/21 1021     Clinical Impression Statement Pt arrived today LT knee stiffness/ soreness, but did well after Rx. Functional squat and sit to stand practiced for hip/ glute engagement to destress knees. Vaso end of session    Personal Factors and Comorbidities Comorbidity 1;Comorbidity 3+;Comorbidity 2;Other    Comorbidities Coronary angioplasty with stent placement, OA, CAD, DM, HTN, hernia repair, h/o LBP.    Stability/Clinical Decision Making Stable/Uncomplicated    Rehab Potential Excellent    PT Frequency 3x / week    PT Duration 4 weeks    PT Treatment/Interventions ADLs/Self Care Home Management;Cryotherapy;Electrical Stimulation;Gait training;Stair training;Functional mobility training;Therapeutic activities;Therapeutic exercise;Neuromuscular re-education;Manual techniques;Patient/family education;Passive range of motion;Vasopneumatic Device    PT Next Visit Plan Nustep, left knee PROM, progress per TKA protocol.  Vaso             Patient will benefit from skilled therapeutic intervention in order to improve the following deficits and impairments:  Pain, Abnormal gait, Decreased activity tolerance, Decreased range of motion, Decreased strength, Increased edema  Visit Diagnosis: Chronic pain of left knee  Stiffness of left knee, not elsewhere classified  Localized edema  Muscle weakness (generalized)     Problem List Patient Active Problem List   Diagnosis Date Noted   OA (osteoarthritis) of knee 03/08/2021   Primary osteoarthritis of left knee 03/08/2021   Abdominal aortic ectasia (HCC) 12/31/2020   Constipation 12/31/2020   Idiopathic gout 12/31/2020   Mixed hyperlipidemia 12/31/2020   Morbid obesity ([place]) 12/31/2020   Personal history of colonic polyps 12/31/2020   Polyneuropathy in diseases classified elsewhere ([place])  12/31/2020   Type 2 diabetes mellitus  with other circulatory complications ([place]) 81/19/1478   Diabetes mellitus with coincident hypertension ([place]) 09/16/2019   Coronary artery disease involving native coronary artery of native heart without angina pectoris 09/16/2019   Pure hypercholesterolemia 09/16/2019   Incarcerated umbilical hernia 29/56/2130    Rafeef Lau,CHRIS, PTA 04/22/2021, 10:56 AM  [facility] [place], [place], [address] Phone: [phone]   Fax:  [phone]  Name: John Wilson MRN: [id number] Date of Birth: 01-14-1946

## 2021-04-25 DIAGNOSIS — M170 Bilateral primary osteoarthritis of knee: Secondary | ICD-10-CM | POA: Diagnosis not present

## 2021-04-25 DIAGNOSIS — E782 Mixed hyperlipidemia: Secondary | ICD-10-CM | POA: Diagnosis not present

## 2021-04-25 DIAGNOSIS — E1159 Type 2 diabetes mellitus with other circulatory complications: Secondary | ICD-10-CM | POA: Diagnosis not present

## 2021-04-25 DIAGNOSIS — I2510 Atherosclerotic heart disease of native coronary artery without angina pectoris: Secondary | ICD-10-CM | POA: Diagnosis not present

## 2021-04-25 DIAGNOSIS — E119 Type 2 diabetes mellitus without complications: Secondary | ICD-10-CM | POA: Diagnosis not present

## 2021-04-25 DIAGNOSIS — I10 Essential (primary) hypertension: Secondary | ICD-10-CM | POA: Diagnosis not present

## 2021-04-27 ENCOUNTER — Ambulatory Visit: Payer: Medicare HMO | Admitting: Physical Therapy

## 2021-04-27 ENCOUNTER — Encounter: Payer: Self-pay | Admitting: Physical Therapy

## 2021-04-27 ENCOUNTER — Other Ambulatory Visit: Payer: Self-pay

## 2021-04-27 DIAGNOSIS — M6281 Muscle weakness (generalized): Secondary | ICD-10-CM | POA: Diagnosis not present

## 2021-04-27 DIAGNOSIS — M25662 Stiffness of left knee, not elsewhere classified: Secondary | ICD-10-CM | POA: Diagnosis not present

## 2021-04-27 DIAGNOSIS — G8929 Other chronic pain: Secondary | ICD-10-CM

## 2021-04-27 DIAGNOSIS — R600 Localized edema: Secondary | ICD-10-CM | POA: Diagnosis not present

## 2021-04-27 DIAGNOSIS — M25562 Pain in left knee: Secondary | ICD-10-CM | POA: Diagnosis not present

## 2021-04-27 NOTE — Therapy (Signed)
[facility] [place], [place], [address] Phone: [phone]   Fax:  [phone]  Physical Therapy Treatment  Patient Details  Name: John Wilson MRN: [id number] Date of Birth: 1946/01/17 Referring Provider (PT): Gaynelle Arabian MD   Encounter Date: 04/27/2021   PT End of Session - 04/27/21 0819     Visit Number 15    Number of Visits 18    Date for PT Re-Evaluation 05/05/21    Authorization Type FOTO AT LEAST EVERY 5TH VISIT.  PROGRESS NOTE AT 10TH VISIT.  KX MODIFIER AFTER 15 VISITS.   10th visit FOTO 39% limited    PT Start Time 0818    PT Stop Time 0907    PT Time Calculation (min) 49 min    Activity Tolerance Patient tolerated treatment well    Behavior During Therapy WFL for tasks assessed/performed             Past Medical History:  Diagnosis Date   Arthritis    CAD (coronary artery disease)    Diabetes ([place])    ED (erectile dysfunction)    Gout    Heart murmur    Hyperlipidemia    Hypertension    Obesity    Vertigo     Past Surgical History:  Procedure Laterality Date   [place] WITH STENT PLACEMENT  2001   2 separate surgeries, 1 month apart   HERNIA REPAIR  1749   Umbilical   LEFT HEART CATH AND CORONARY ANGIOGRAPHY N/A 03/16/2020   Procedure: LEFT HEART CATH AND CORONARY ANGIOGRAPHY;  Surgeon: Troy Sine, MD;  Location: [facility];  Service: Cardiovascular;  Laterality: N/A;   PILONIDAL CYST EXCISION  1979   TOTAL KNEE ARTHROPLASTY Left 03/08/2021   Procedure: TOTAL KNEE ARTHROPLASTY;  Surgeon: Gaynelle Arabian, MD;  Location: [facility];  Service: Orthopedics;  Laterality: Left;  70mn    There were no vitals filed for this visit.   Subjective Assessment - 04/27/21 0819     Subjective COVID-19 screen performed prior to patient entering clinic. Reports he has had more discomfort in the last few days since surgery. Reports that the only thing he did  was get his four wheeler and walking a short distance. Other than that he just goes out to his garden to pick vegetables.    Pertinent History Coronary angioplasty with stent placement, OA, CAD, DM, HTN, hernia repair, h/o LBP.    How long can you walk comfortably? Around house with a FWW.    Patient Stated Goals Perform ADL's without left knee pain.    Currently in Pain? Yes    Pain Score 8     Pain Location Knee    Pain Orientation Left    Pain Descriptors / Indicators Tightness    Pain Type Surgical pain    Pain Onset More than a month ago    Pain Frequency Constant                [facility] PT Assessment - 04/27/21 0001       Assessment   Medical Diagnosis Left total knee replacement.    Referring Provider (PT) FGaynelle ArabianMD    Onset Date/Surgical Date 03/08/21    Hand Dominance Right    Next MD Visit 06/13/2021      Precautions   Precaution Comments No ultrasound.  [place] Adult PT Treatment/Exercise - 04/27/21 0001       Knee/Hip Exercises: Aerobic   Recumbent Bike Level 1 x 13 minutes, seat 11    Nustep Level 5 x 5 minutes.      Knee/Hip Exercises: Machines for Strengthening   Cybex Knee Extension 10#  3x fatigue    Cybex Knee Flexion 30#   3x fatigue    Cybex Leg Press 2.5 pl, seat 7 x30 reps      Modalities   Modalities Vasopneumatic      Vasopneumatic   Number Minutes Vasopneumatic  10 minutes    Vasopnuematic Location  Knee    Vasopneumatic Pressure Medium    Vasopneumatic Temperature  34/pain and edema                         PT Long Term Goals - 04/14/21 0932       PT LONG TERM GOAL #1   Title Independent with a HEP.    Baseline Met 08/20/20    Time 4    Period Weeks    Status Achieved      PT LONG TERM GOAL #2   Title Full active left knee extension in order to normalize gait.    Time 4    Period Weeks    Status On-going      PT LONG TERM GOAL #3   Title Active knee flexion to  115 degrees+ so the patient can perform functional tasks and do so with pain not > 2-3/10.    Time 4    Status On-going      PT LONG TERM GOAL #4   Title Decrease edema to within 2.5 cms of non-affected side to assist with pain reduction and range of motion gains.    Time 4    Period Weeks    Status On-going      PT LONG TERM GOAL #5   Title Increase right hip and knee strength to a solid 4+/5 to provide good stability for accomplishment of functional activities.    Period Weeks    Status On-going      PT LONG TERM GOAL #6   Title Perform a reciprocating stair gait with one railing with pain not > 2-3/10.    Time 4    Period Weeks    Status Achieved                   Plan - 04/27/21 1016     Clinical Impression Statement Patient presented in clinic with reports of greater L knee pain in the last few days. Some inflammation noted in L knee but patient did load and unload fourwheeler over the weekend. Patient denies any excessive activity that may elicit greater pain. Patient reported some relief after stationary bike to allow more ROM. Patient guided through machine strengthening with rest breaks as needed for pain relief. Normal vasopneumatic response noted following removal of the modality.    Personal Factors and Comorbidities Comorbidity 1;Comorbidity 3+;Comorbidity 2;Other    Comorbidities Coronary angioplasty with stent placement, OA, CAD, DM, HTN, hernia repair, h/o LBP.    Examination-Activity Limitations Other;Locomotion Level;Stairs    Examination-Participation Restrictions Other    Stability/Clinical Decision Making Stable/Uncomplicated    Rehab Potential Excellent    PT Frequency 3x / week    PT Duration 4 weeks    PT Treatment/Interventions ADLs/Self Care Home Management;Cryotherapy;Electrical Stimulation;Gait training;Stair training;Functional mobility training;Therapeutic activities;Therapeutic exercise;Neuromuscular re-education;Manual  techniques;Patient/family education;Passive  range of motion;Vasopneumatic Device    PT Next Visit Plan Nustep, left knee PROM, progress per TKA protocol.  Vaso    Consulted and Agree with Plan of Care Patient             Patient will benefit from skilled therapeutic intervention in order to improve the following deficits and impairments:  Pain, Abnormal gait, Decreased activity tolerance, Decreased range of motion, Decreased strength, Increased edema  Visit Diagnosis: Chronic pain of left knee  Stiffness of left knee, not elsewhere classified  Localized edema  Muscle weakness (generalized)     Problem List Patient Active Problem List   Diagnosis Date Noted   OA (osteoarthritis) of knee 03/08/2021   Primary osteoarthritis of left knee 03/08/2021   Abdominal aortic ectasia (HCC) 12/31/2020   Constipation 12/31/2020   Idiopathic gout 12/31/2020   Mixed hyperlipidemia 12/31/2020   Morbid obesity ([place]) 12/31/2020   Personal history of colonic polyps 12/31/2020   Polyneuropathy in diseases classified elsewhere ([place]) 12/31/2020   Type 2 diabetes mellitus with other circulatory complications ([place]) 89/16/9450   Diabetes mellitus with coincident hypertension ([place]) 09/16/2019   Coronary artery disease involving native coronary artery of native heart without angina pectoris 09/16/2019   Pure hypercholesterolemia 09/16/2019   Incarcerated umbilical hernia 38/88/2800    Standley Brooking, PTA 04/27/2021, 10:22 AM  [facility] [address] [place], [place], [address] Phone: [phone]   Fax:  [phone]  Name: John Wilson MRN: [id number] Date of Birth: 1945/12/16

## 2021-04-29 ENCOUNTER — Ambulatory Visit: Payer: Medicare HMO | Admitting: Physical Therapy

## 2021-04-29 ENCOUNTER — Encounter: Payer: Self-pay | Admitting: Physical Therapy

## 2021-04-29 ENCOUNTER — Other Ambulatory Visit: Payer: Self-pay

## 2021-04-29 DIAGNOSIS — M25562 Pain in left knee: Secondary | ICD-10-CM | POA: Diagnosis not present

## 2021-04-29 DIAGNOSIS — R600 Localized edema: Secondary | ICD-10-CM

## 2021-04-29 DIAGNOSIS — M6281 Muscle weakness (generalized): Secondary | ICD-10-CM | POA: Diagnosis not present

## 2021-04-29 DIAGNOSIS — G8929 Other chronic pain: Secondary | ICD-10-CM

## 2021-04-29 DIAGNOSIS — M25662 Stiffness of left knee, not elsewhere classified: Secondary | ICD-10-CM

## 2021-04-29 NOTE — Therapy (Signed)
[facility] [place], [place], [address] Phone: [phone]   Fax:  [phone]  Physical Therapy Treatment  Patient Details  Name: John Wilson MRN: [id number] Date of Birth: [birth year] Referring Provider (PT): Gaynelle Arabian MD   Encounter Date: 04/29/2021   PT End of Session - 04/29/21 0822     Visit Number 16    Number of Visits 18    Date for PT Re-Evaluation 05/05/21    Authorization Type FOTO AT LEAST EVERY 5TH VISIT.  PROGRESS NOTE AT 10TH VISIT.  KX MODIFIER AFTER 15 VISITS.   10th visit FOTO 39% limited    PT Start Time 0817    PT Stop Time 0904    PT Time Calculation (min) 47 min    Activity Tolerance Patient tolerated treatment well    Behavior During Therapy WFL for tasks assessed/performed             Past Medical History:  Diagnosis Date   Arthritis    CAD (coronary artery disease)    Diabetes ([place])    ED (erectile dysfunction)    Gout    Heart murmur    Hyperlipidemia    Hypertension    Obesity    Vertigo     Past Surgical History:  Procedure Laterality Date   [place] WITH STENT PLACEMENT  2001   2 separate surgeries, 1 month apart   HERNIA REPAIR  6712   Umbilical   LEFT HEART CATH AND CORONARY ANGIOGRAPHY N/A 03/16/2020   Procedure: LEFT HEART CATH AND CORONARY ANGIOGRAPHY;  Surgeon: Troy Sine, MD;  Location: [facility];  Service: Cardiovascular;  Laterality: N/A;   PILONIDAL CYST EXCISION  1979   TOTAL KNEE ARTHROPLASTY Left 03/08/2021   Procedure: TOTAL KNEE ARTHROPLASTY;  Surgeon: Gaynelle Arabian, MD;  Location: [facility];  Service: Orthopedics;  Laterality: Left;  79mn    There were no vitals filed for this visit.   Subjective Assessment - 04/29/21 0821     Subjective COVID-19 screen performed prior to patient entering clinic. Reports his knee feels better today.    Pertinent History Coronary angioplasty with stent placement, OA,  CAD, DM, HTN, hernia repair, h/o LBP.    How long can you walk comfortably? Around house with a FWW.    Patient Stated Goals Perform ADL's without left knee pain.    Currently in Pain? Yes    Pain Score 3     Pain Location Knee    Pain Orientation Left    Pain Descriptors / Indicators Tightness    Pain Type Surgical pain    Pain Onset More than a month ago    Pain Frequency Intermittent                [facility] PT Assessment - 04/29/21 0001       Assessment   Medical Diagnosis Left total knee replacement.    Referring Provider (PT) FGaynelle ArabianMD    Onset Date/Surgical Date 03/08/21    Hand Dominance Right    Next MD Visit 06/13/2021      Precautions   Precaution Comments No ultrasound.      ROM / Strength   AROM / PROM / Strength AROM      AROM   Overall AROM  Within functional limits for tasks performed    AROM Assessment Site Knee    Right/Left Knee Left    Left Knee Extension 10  Left Knee Flexion 120                           [facility] Adult PT Treatment/Exercise - 04/29/21 0001       Knee/Hip Exercises: Aerobic   Recumbent Bike L2, seat 9 x10 min    Nustep L6, seat 10 x6 min      Knee/Hip Exercises: Machines for Strengthening   Cybex Knee Extension 10#  3x fatigue    Cybex Knee Flexion 30#   3x fatigue    Cybex Leg Press 2.5 pl, seat 7 x30 reps      Modalities   Modalities Vasopneumatic      Vasopneumatic   Number Minutes Vasopneumatic  10 minutes    Vasopnuematic Location  Knee    Vasopneumatic Pressure Medium    Vasopneumatic Temperature  34/pain and edema                         PT Long Term Goals - 04/29/21 0857       PT LONG TERM GOAL #1   Title Independent with a HEP.    Baseline Met 08/20/20    Time 4    Period Weeks    Status Achieved      PT LONG TERM GOAL #2   Title Full active left knee extension in order to normalize gait.    Time 4    Period Weeks    Status On-going      PT LONG TERM GOAL #3    Title Active knee flexion to 115 degrees+ so the patient can perform functional tasks and do so with pain not > 2-3/10.    Time 4    Status Achieved      PT LONG TERM GOAL #4   Title Decrease edema to within 2.5 cms of non-affected side to assist with pain reduction and range of motion gains.    Time 4    Period Weeks    Status On-going      PT LONG TERM GOAL #5   Title Increase right hip and knee strength to a solid 4+/5 to provide good stability for accomplishment of functional activities.    Period Weeks    Status On-going      PT LONG TERM GOAL #6   Title Perform a reciprocating stair gait with one railing with pain not > 2-3/10.    Time 4    Period Weeks    Status Achieved                   Plan - 04/29/21 0859     Clinical Impression Statement Patient presented in clinic with less L knee pain today. Patient continues to report some soreness but curious of L knee ROM. Patient able to tolerate all therex for progressive strengthening with no complaints of any increased tightness. Patient states that he needs to disassemble an outdoor fire pit today after PT. AROM of L knee measured as 10-120 deg. Patient instructed in prone hang. Normal vasopneumatic response noted following removal of the modality.    Personal Factors and Comorbidities Comorbidity 1;Comorbidity 3+;Comorbidity 2;Other    Comorbidities Coronary angioplasty with stent placement, OA, CAD, DM, HTN, hernia repair, h/o LBP.    Examination-Activity Limitations Other;Locomotion Level;Stairs    Examination-Participation Restrictions Other    Stability/Clinical Decision Making Stable/Uncomplicated    Rehab Potential Excellent    PT Frequency 3x / week  PT Duration 4 weeks    PT Treatment/Interventions ADLs/Self Care Home Management;Cryotherapy;Electrical Stimulation;Gait training;Stair training;Functional mobility training;Therapeutic activities;Therapeutic exercise;Neuromuscular re-education;Manual  techniques;Patient/family education;Passive range of motion;Vasopneumatic Device    PT Next Visit Plan Nustep, left knee PROM, progress per TKA protocol.  Vaso    Consulted and Agree with Plan of Care Patient             Patient will benefit from skilled therapeutic intervention in order to improve the following deficits and impairments:  Pain, Abnormal gait, Decreased activity tolerance, Decreased range of motion, Decreased strength, Increased edema  Visit Diagnosis: Chronic pain of left knee  Stiffness of left knee, not elsewhere classified  Localized edema  Muscle weakness (generalized)     Problem List Patient Active Problem List   Diagnosis Date Noted   OA (osteoarthritis) of knee 03/08/2021   Primary osteoarthritis of left knee 03/08/2021   Abdominal aortic ectasia (HCC) 12/31/2020   Constipation 12/31/2020   Idiopathic gout 12/31/2020   Mixed hyperlipidemia 12/31/2020   Morbid obesity ([place]) 12/31/2020   Personal history of colonic polyps 12/31/2020   Polyneuropathy in diseases classified elsewhere ([place]) 12/31/2020   Type 2 diabetes mellitus with other circulatory complications ([place]) 70/34/0352   Diabetes mellitus with coincident hypertension ([place]) 09/16/2019   Coronary artery disease involving native coronary artery of native heart without angina pectoris 09/16/2019   Pure hypercholesterolemia 09/16/2019   Incarcerated umbilical hernia 48/18/5909    Standley Brooking, PTA 04/29/2021, 9:13 AM  [facility] [address] [place], [place], [address] Phone: [phone]   Fax:  [phone]  Name: John Wilson MRN: [id number] Date of Birth: 07-12-1946

## 2021-05-03 ENCOUNTER — Ambulatory Visit (INDEPENDENT_AMBULATORY_CARE_PROVIDER_SITE_OTHER): Payer: Medicare HMO | Admitting: Pharmacy Technician

## 2021-05-03 ENCOUNTER — Other Ambulatory Visit: Payer: Self-pay

## 2021-05-03 DIAGNOSIS — I10 Essential (primary) hypertension: Secondary | ICD-10-CM | POA: Insufficient documentation

## 2021-05-03 NOTE — Progress Notes (Signed)
Patient ID: John Wilson                 DOB: [birth year]                      MRN: [id number]     HPI: John Wilson is a 75 y.o. male referred by Dr. Marlou Porch to HTN clinic. PMH is significant for CAD (2003 RCA stent placed, 2021 LHC with 70% stenosis of Dist RCA-1), diabetes, gout, heart murmur, HLD, HTN, and obesity. Patient is post knee replacement in April 2022.  At last visit with Dr. Marlou Porch on 5/31, patient endorsed increasing home blood pressure readings in the mornings with systolic in the 629B but has previously been hypotensive in the middle of the day with systolic in the 28-413K. At this visit, patient's amlodipine was increased from 2.5 mg daily to 5 mg daily.  Patient presents to HTN clinic for initial visit. Patient continued to endorse high blood pressure readings in the morning with systolic in the 440-102'V followed by symptomatic hypotension (lightheadedness, dizziness, fatigue) around 25DG with a systolic blood pressure in the 80-90s, followed by a normalized blood pressure in the evenings with a systolic in the 644'I. Patient takes all blood pressure medications in the morning around 5am. Because of his hypotension, patient reports stopping his Imdur about a week ago with no change in his symptoms or blood pressure readings. Patient endorsed improved pain levels following knee replacement surgery and staying hydrated throughout the day. Patient brought in his blood pressure monitor which had consistent readings with clinic's manual cuff (patient's BP cuff reading: 154/88 mmHg HR 55; manual cuff reading: 158/78 mmHg HR 54).  Current HTN meds: amlodipine 5 mg daily, HCTZ 25 mg daily, metoprolol succinate 50 mg daily, losartan 100 mg daily, Imdur 30 mg daily  Previously tried: valsartan-HCTZ (stopped due to changing doses) BP goal: <130/80 mmHg  Family History: sick sinus syndrome (mother)  Social History: former smoker (quit 1984), denies alcohol or drug use  Diet: eats well w/  no salt in butter or in cooking, no sauces Drinks 5-6 bottles of water, sips of Pepsi  Exercise: gardening, working outside, regular physical therapy for knee  Home BP readings:  Morning: 163/80 HR 58, 150/75 HR 59 Midday: 97/56 HR 54, 78/52 HR 52, 101/58 HR 61, 92/52 HR 61, 86/56 HR 58 Evening: 113/63 HR 59, 109/58 HR 57, 133/76 HR 58  Wt Readings from Last 3 Encounters:  04/13/21 254 lb (115.2 kg)  03/08/21 252 lb (114.3 kg)  02/24/21 252 lb (114.3 kg)   BP Readings from Last 3 Encounters:  04/13/21 (!) 164/70  03/09/21 (!) 142/68  02/24/21 (!) 172/75   Pulse Readings from Last 3 Encounters:  04/13/21 (!) 59  03/09/21 (!) 49  02/24/21 (!) 53    Renal function: CrCl cannot be calculated (Patient's most recent lab result is older than the maximum 21 days allowed.).  Past Medical History:  Diagnosis Date   Arthritis    CAD (coronary artery disease)    Diabetes ([place])    ED (erectile dysfunction)    Gout    Heart murmur    Hyperlipidemia    Hypertension    Obesity    Vertigo     Current Outpatient Medications on File Prior to Visit  Medication Sig Dispense Refill   amLODipine (NORVASC) 5 MG tablet Take 1 tablet (5 mg total) by mouth daily. 90 tablet 3   atorvastatin (LIPITOR) 80 MG tablet Take  1 tablet (80 mg total) by mouth daily. 90 tablet 3   gabapentin (NEURONTIN) 300 MG capsule Take a 300 mg capsule three times a day for two weeks following surgery.Then take a 300 mg capsule two times a day for two weeks. Then take a 300 mg capsule once a day for two weeks. Then discontinue. 84 capsule 0   hydrochlorothiazide (HYDRODIURIL) 25 MG tablet Take 1 tablet (25 mg total) by mouth daily. 90 tablet 3   isosorbide mononitrate (IMDUR) 30 MG 24 hr tablet Take 1 tablet (30 mg total) by mouth daily. 90 tablet 3   losartan (COZAAR) 100 MG tablet Take 1 tablet (100 mg total) by mouth daily. 90 tablet 3   metFORMIN (GLUCOPHAGE-XR) 500 MG 24 hr tablet Take 1,000 mg by mouth daily  with breakfast.     methocarbamol (ROBAXIN) 500 MG tablet Take 1 tablet (500 mg total) by mouth every 6 (six) hours as needed for muscle spasms. 40 tablet 0   metoprolol succinate (TOPROL-XL) 50 MG 24 hr tablet Take 50 mg by mouth daily. Take with or immediately following a meal.     nitroGLYCERIN (NITROSTAT) 0.4 MG SL tablet Place 0.4 mg under the tongue every 5 (five) minutes as needed for chest pain.     oxyCODONE (OXY IR/ROXICODONE) 5 MG immediate release tablet Take 1-2 tablets (5-10 mg total) by mouth every 6 (six) hours as needed for severe pain. Not to exceed 6 tablets a day. 42 tablet 0   rivaroxaban (XARELTO) 10 MG TABS tablet Take 1 tablet (10 mg total) by mouth daily with breakfast for 20 days. Then resume one 81 mg aspirin once a day. 20 tablet 0   traMADol (ULTRAM) 50 MG tablet Take 1-2 tablets (50-100 mg total) by mouth every 6 (six) hours as needed for moderate pain. 40 tablet 0   No current facility-administered medications on file prior to visit.    Allergies  Allergen Reactions   Sulfa Antibiotics Hives     Assessment/Plan:  1. Hypertension - Patient's blood pressure is variable around his goal of <130/80 mmHg with elevated readings in the morning and low readings midday. Because patient takes all blood pressure medications in the morning, will split his losartan 100 mg into 50 mg in the morning and 50 mg in the evening and have patient take amlodipine 5 mg in the evening. Encouraged patient to resume taking his Imdur 30 mg daily given lack of effect on blood pressure, history of CAD and stents, and no change in symptoms with stopping for a week. Encouraged patient to call with any symptoms of lightheadedness or dizziness or worsening of blood pressure readings. Will follow-up with patient in clinic in 4 weeks due to patients vacation.    Romilda Garret, PharmD PGY1 Acute Care Pharmacy Resident 05/03/2021 8:09 AM

## 2021-05-03 NOTE — Patient Instructions (Addendum)
It was great meeting you today!  Your blood pressure is variable around your goal of <130/80 mmHg.   CHANGE how you take your losartan to 50 mg in the morning and 50 mg in the evening  CHANGE how you take your amlodipine 5 mg to in the evening CONTINUE taking your metoprolol succinate 50 mg daily  CONTINUE taking your HCTZ 25 mg daily  RESUME taking your Imdur 30 mg daily  Please call if you notice any more symptoms of lightheadedness or dizziness or high blood pressure in the morning. We will follow up in clinic in 4 weeks.

## 2021-05-04 ENCOUNTER — Ambulatory Visit: Payer: Medicare HMO

## 2021-05-04 DIAGNOSIS — R600 Localized edema: Secondary | ICD-10-CM | POA: Diagnosis not present

## 2021-05-04 DIAGNOSIS — G8929 Other chronic pain: Secondary | ICD-10-CM | POA: Diagnosis not present

## 2021-05-04 DIAGNOSIS — M25662 Stiffness of left knee, not elsewhere classified: Secondary | ICD-10-CM | POA: Diagnosis not present

## 2021-05-04 DIAGNOSIS — M6281 Muscle weakness (generalized): Secondary | ICD-10-CM

## 2021-05-04 DIAGNOSIS — M25562 Pain in left knee: Secondary | ICD-10-CM | POA: Diagnosis not present

## 2021-05-04 NOTE — Therapy (Signed)
[facility] [place], [place], [address] Phone: [phone]   Fax:  [phone]  May 04, 2021   No Recipients  Physical Therapy Discharge Summary  Patient: John Wilson  MRN: [id number]  Date of Birth: [birth year]   Diagnosis: Chronic pain of left knee  Stiffness of left knee, not elsewhere classified  Localized edema  Muscle weakness (generalized) Referring Provider (PT): Gaynelle Arabian MD   The above patient had been seen in Physical Therapy  times of 17 treatments scheduled with 0 no shows and 0 cancellations.  The treatment consisted of Review of HEP; discusssion of therapetic stretches to continue for ongoing tenderness at the VMO. Recommendations for continued strength and conditioning in preparation for his anticipated next surgical intervention for RLE.   The patient is: Improved  Subjective: Patient reports that he does not feel limited with any activities at home. He walks as far as he wants with only limitations presenting due to pain in the R knee that he anticipates having surgcally addressed I nJanuary 2023.   Discharge Findings:  Knee flexion - 120deg; Knee ext -3 deg; MMT 5/5 LLE; Independent with transfers and gait.  On-going muscle soreness at VMO - relieved with stretching. All goals met with independence with continue HEP.   Functional Status at Discharge: Independent  All Goals Met   Plan - 05/04/21 0914     Clinical Impression Statement Patient demonstates independence with HEP and exercises for mobility. He is able to discuss importance of continued HEP. He presents with full functional rROM and strength. At this time, after patient demonstrates and discusses independence and goals achieved, skilled PT appropriate to be discontinued.    Personal Factors and Comorbidities Comorbidity 1;Comorbidity 3+;Comorbidity 2;Other    Comorbidities Coronary angioplasty with stent placement, OA, CAD, DM, HTN,  hernia repair, h/o LBP.    Stability/Clinical Decision Making Stable/Uncomplicated    Clinical Decision Making Low    Rehab Potential Excellent    Consulted and Agree with Plan of Care Patient             Sincerely,   Marylou Mccoy, PT   CC No Recipients  [facility] [address] [place], [place], [address] Phone: [phone]   Fax:  [phone]  Patient: Denilson Salminen  MRN: [id number]  Date of Birth: 1946-09-27

## 2021-05-06 ENCOUNTER — Encounter: Payer: Medicare HMO | Admitting: *Deleted

## 2021-05-28 ENCOUNTER — Ambulatory Visit (INDEPENDENT_AMBULATORY_CARE_PROVIDER_SITE_OTHER): Payer: Medicare HMO | Admitting: Pharmacist

## 2021-05-28 ENCOUNTER — Other Ambulatory Visit: Payer: Self-pay

## 2021-05-28 VITALS — BP 140/78 | HR 55 | Wt 249.2 lb

## 2021-05-28 DIAGNOSIS — I10 Essential (primary) hypertension: Secondary | ICD-10-CM | POA: Diagnosis not present

## 2021-05-28 MED ORDER — CHLORTHALIDONE 25 MG PO TABS: 25.0000 mg | ORAL_TABLET | Freq: Every day | ORAL | 1 refills | Status: DC

## 2021-05-28 MED ORDER — LOSARTAN POTASSIUM 50 MG PO TABS: ORAL_TABLET | ORAL | 1 refills | Status: DC

## 2021-05-28 NOTE — Patient Instructions (Addendum)
It was nice meeting you today!  We would like to have your blood pressure less than 130/80  We will continue losartan 50mg  twice a day but I will send in a new prescription for you  We will change the hydrochlorothiazide to chlorthalidone to help with better blood pressure control.  You will take 1 tablet (25mg ) in the morning  Continue your amlodipine 5 mg daily Continue metoprolol 50mg  daily Continue isosorbide 30 mg daily  Continue to monitor your blood pressure at home and let us know if there are any issues  Karren Cobble, PharmD, BCACP, [place], [facility] [address], [place], [place] [address] Phone: [phone]; Fax: [phone] 05/28/2021 9:14 AM

## 2021-05-28 NOTE — Progress Notes (Signed)
Patient ID: John Wilson                 DOB: May 11, 1946                      MRN: [id number]     HPI: John Wilson is a 75 y.o. male referred by Dr. Marlou Porch to HTN clinic. PMH is significant for DM, CAD, HTN, HLD, and obesity.  At visit with Dr. Marlou Porch on 5/31, patient endorsed increasing home blood pressure readings in the mornings with systolic in the 381W but has previously been hypotensive in the middle of the day with systolic in the 29-937J. At this visit, patient's amlodipine was increased from 2.5 mg daily to 5 mg daily.   At last visit with Pharm D on 05/03/21 patient continued to endorse high blood pressure readings in the morning with systolic in the 696-789'F followed by symptomatic hypotension (lightheadedness, dizziness, fatigue) around 81OF with a systolic blood pressure in the 80-90s, followed by a normalized blood pressure in the evenings with a systolic in the 751'W. Patient takes all blood pressure medications in the morning around 5am. Because of his hypotension, patient reports stopping his Imdur about a week ago with no change in his symptoms or blood pressure readings. Patient endorsed improved pain levels following knee replacement surgery and staying hydrated throughout the day. Patient brought in his blood pressure monitor which had consistent readings with clinic's manual cuff (patient's BP cuff reading: 154/88 mmHg HR 55; manual cuff reading: 158/78 mmHg HR 54).  Patient's losartan was split to 50 mg BID and instructed to resume Imdur.  Patient presents today frustrated by his labile hypertension.  Is also having difficulty cutting his losartan 100mg  tablets due to them being teardrop shaped.  Can not ensure he is getting 50mg  with each dose.    Brought his BP cuff and read off recent readings 163/79 last night 155/92 this morning 7:35am 135/68 127/69 124/63  He takes his metoprolol and losartan 50mg  in morning and takes amlodipine, HCTZ, and other losartan 50mg  at  night.  Reports he does awake to urinate at night but he relates this to old age.  Has a small amount of LEE.  Does not check BG at home and requested to check in room: 115 mg/dL.  Current HTN meds: amlodipine 5mg  daily, HCTZ 25mg  daily, losartan 50mg  BID, Toprol 50mg  daily, isosorbide 30 mg daily Previously tried:  BP goal: <130/80  Wt Readings from Last 3 Encounters:  04/13/21 254 lb (115.2 kg)  03/08/21 252 lb (114.3 kg)  02/24/21 252 lb (114.3 kg)   BP Readings from Last 3 Encounters:  05/03/21 (!) 158/78  04/13/21 (!) 164/70  03/09/21 (!) 142/68   Pulse Readings from Last 3 Encounters:  05/03/21 (!) 54  04/13/21 (!) 59  03/09/21 (!) 49    Renal function: CrCl cannot be calculated (Patient's most recent lab result is older than the maximum 21 days allowed.).  Past Medical History:  Diagnosis Date   Arthritis    CAD (coronary artery disease)    Diabetes ([place])    ED (erectile dysfunction)    Gout    Heart murmur    Hyperlipidemia    Hypertension    Obesity    Vertigo     Current Outpatient Medications on File Prior to Visit  Medication Sig Dispense Refill   amLODipine (NORVASC) 5 MG tablet Take 1 tablet (5 mg total) by mouth daily. 90 tablet 3  aspirin EC 81 MG tablet Take 81 mg by mouth daily. Swallow whole.     atorvastatin (LIPITOR) 80 MG tablet Take 1 tablet (80 mg total) by mouth daily. 90 tablet 3   gabapentin (NEURONTIN) 300 MG capsule Take a 300 mg capsule three times a day for two weeks following surgery.Then take a 300 mg capsule two times a day for two weeks. Then take a 300 mg capsule once a day for two weeks. Then discontinue. (Patient not taking: No sig reported) 84 capsule 0   hydrochlorothiazide (HYDRODIURIL) 25 MG tablet Take 1 tablet (25 mg total) by mouth daily. 90 tablet 3   isosorbide mononitrate (IMDUR) 30 MG 24 hr tablet Take 1 tablet (30 mg total) by mouth daily. (Patient not taking: Reported on 05/03/2021) 90 tablet 3   losartan (COZAAR) 100  MG tablet Take 1 tablet (100 mg total) by mouth daily. 90 tablet 3   metFORMIN (GLUCOPHAGE-XR) 500 MG 24 hr tablet Take 1,000 mg by mouth daily with breakfast.     methocarbamol (ROBAXIN) 500 MG tablet Take 1 tablet (500 mg total) by mouth every 6 (six) hours as needed for muscle spasms. 40 tablet 0   metoprolol succinate (TOPROL-XL) 50 MG 24 hr tablet Take 50 mg by mouth daily. Take with or immediately following a meal.     nitroGLYCERIN (NITROSTAT) 0.4 MG SL tablet Place 0.4 mg under the tongue every 5 (five) minutes as needed for chest pain. (Patient not taking: Reported on 05/03/2021)     oxyCODONE (OXY IR/ROXICODONE) 5 MG immediate release tablet Take 1-2 tablets (5-10 mg total) by mouth every 6 (six) hours as needed for severe pain. Not to exceed 6 tablets a day. (Patient not taking: No sig reported) 42 tablet 0   Psyllium (VEGETABLE LAXATIVE PO) Take 1 tablet by mouth daily.     rivaroxaban (XARELTO) 10 MG TABS tablet Take 1 tablet (10 mg total) by mouth daily with breakfast for 20 days. Then resume one 81 mg aspirin once a day. (Patient not taking: No sig reported) 20 tablet 0   traMADol (ULTRAM) 50 MG tablet Take 1-2 tablets (50-100 mg total) by mouth every 6 (six) hours as needed for moderate pain. 40 tablet 0   No current facility-administered medications on file prior to visit.    Allergies  Allergen Reactions   Sulfa Antibiotics Hives     Assessment/Plan:  1. Hypertension -  Patient BP in room 140/78 which is above goal of <130/80 but has improved.    Will send in new prescription for losartan 50mg  BID to ensure patient is getting an accurate dose twice daily and will not have to cut tablets anymore.  Recommended patient begin taking his thiazide diuretic in the morning to try to reduce nighttime urination.  Will switch to chlorthalidone 25mg  for better BP control.  Will check BMP in about 2 weeks.  Recommended patient continue to monitor BP at home and to call if there are  significant increases.  Continue amlodipine 10mg  daily Continue isosorbide30mg  daily Continue Toprol XL 50mg  daily Start losartan 50mg  BID Stop HCTZ 25mg  daily Start chlorthalidone 25mg  daily in the morniing Check BMP in 2 weeks Recheck as needed  Karren Cobble, PharmD, BCACP, [place], [place] [address] [place], [place] [address] Phone: [phone]; Fax: [phone] 05/28/2021 4:16 PM

## 2021-06-17 ENCOUNTER — Other Ambulatory Visit: Payer: Medicare HMO

## 2021-07-20 DIAGNOSIS — M5136 Other intervertebral disc degeneration, lumbar region: Secondary | ICD-10-CM | POA: Diagnosis not present

## 2021-07-20 DIAGNOSIS — M545 Low back pain, unspecified: Secondary | ICD-10-CM | POA: Diagnosis not present

## 2021-07-23 DIAGNOSIS — B351 Tinea unguium: Secondary | ICD-10-CM | POA: Diagnosis not present

## 2021-07-23 DIAGNOSIS — Z7984 Long term (current) use of oral hypoglycemic drugs: Secondary | ICD-10-CM | POA: Diagnosis not present

## 2021-07-23 DIAGNOSIS — I10 Essential (primary) hypertension: Secondary | ICD-10-CM | POA: Diagnosis not present

## 2021-07-23 DIAGNOSIS — E782 Mixed hyperlipidemia: Secondary | ICD-10-CM | POA: Diagnosis not present

## 2021-07-23 DIAGNOSIS — E119 Type 2 diabetes mellitus without complications: Secondary | ICD-10-CM | POA: Diagnosis not present

## 2021-07-23 DIAGNOSIS — I2510 Atherosclerotic heart disease of native coronary artery without angina pectoris: Secondary | ICD-10-CM | POA: Diagnosis not present

## 2021-07-23 DIAGNOSIS — Z86010 Personal history of colonic polyps: Secondary | ICD-10-CM | POA: Diagnosis not present

## 2021-08-03 DIAGNOSIS — I10 Essential (primary) hypertension: Secondary | ICD-10-CM | POA: Diagnosis not present

## 2021-08-03 DIAGNOSIS — E1159 Type 2 diabetes mellitus with other circulatory complications: Secondary | ICD-10-CM | POA: Diagnosis not present

## 2021-08-03 DIAGNOSIS — M170 Bilateral primary osteoarthritis of knee: Secondary | ICD-10-CM | POA: Diagnosis not present

## 2021-08-03 DIAGNOSIS — I2510 Atherosclerotic heart disease of native coronary artery without angina pectoris: Secondary | ICD-10-CM | POA: Diagnosis not present

## 2021-08-03 DIAGNOSIS — E782 Mixed hyperlipidemia: Secondary | ICD-10-CM | POA: Diagnosis not present

## 2021-08-03 DIAGNOSIS — E119 Type 2 diabetes mellitus without complications: Secondary | ICD-10-CM | POA: Diagnosis not present

## 2021-08-10 DIAGNOSIS — Z0100 Encounter for examination of eyes and vision without abnormal findings: Secondary | ICD-10-CM | POA: Diagnosis not present

## 2021-08-10 DIAGNOSIS — E7800 Pure hypercholesterolemia, unspecified: Secondary | ICD-10-CM | POA: Diagnosis not present

## 2021-08-10 DIAGNOSIS — E119 Type 2 diabetes mellitus without complications: Secondary | ICD-10-CM | POA: Diagnosis not present

## 2021-08-10 DIAGNOSIS — H5200 Hypermetropia, unspecified eye: Secondary | ICD-10-CM | POA: Diagnosis not present

## 2021-08-16 DIAGNOSIS — M47816 Spondylosis without myelopathy or radiculopathy, lumbar region: Secondary | ICD-10-CM | POA: Diagnosis not present

## 2021-09-23 DIAGNOSIS — M25561 Pain in right knee: Secondary | ICD-10-CM | POA: Diagnosis not present

## 2021-09-24 DIAGNOSIS — I10 Essential (primary) hypertension: Secondary | ICD-10-CM | POA: Diagnosis not present

## 2021-09-24 DIAGNOSIS — E1159 Type 2 diabetes mellitus with other circulatory complications: Secondary | ICD-10-CM | POA: Diagnosis not present

## 2021-09-24 DIAGNOSIS — E119 Type 2 diabetes mellitus without complications: Secondary | ICD-10-CM | POA: Diagnosis not present

## 2021-09-24 DIAGNOSIS — E782 Mixed hyperlipidemia: Secondary | ICD-10-CM | POA: Diagnosis not present

## 2021-09-24 DIAGNOSIS — I2510 Atherosclerotic heart disease of native coronary artery without angina pectoris: Secondary | ICD-10-CM | POA: Diagnosis not present

## 2021-09-24 DIAGNOSIS — M170 Bilateral primary osteoarthritis of knee: Secondary | ICD-10-CM | POA: Diagnosis not present

## 2021-09-28 ENCOUNTER — Telehealth: Payer: Self-pay | Admitting: *Deleted

## 2021-09-28 NOTE — Telephone Encounter (Signed)
   Name: John Wilson  DOB: 1946/06/11  MRN: [id number]  Primary Cardiologist: Candee Furbish, MD  Chart reviewed as part of pre-operative protocol coverage. Pt last seen in 03/2021 with plans for 6 mos follow up.  As follow up is due now at time of need for surgical clearance, will arrange follow up appt.  Because of John Wilson's past medical history and time since last visit, he will require a follow-up visit in order to better assess preoperative cardiovascular risk.  Pre-op covering staff: - Please schedule appointment and call patient to inform them. If patient already had an upcoming appointment within acceptable timeframe, please add "pre-op clearance" to the appointment notes so provider is aware. - Please contact requesting surgeon's office via preferred method (i.e, phone, fax) to inform them of need for appointment prior to surgery.   Richardson Dopp, PA-C  09/28/2021, 10:09 PM

## 2021-09-28 NOTE — Telephone Encounter (Signed)
   [facility] Pre-operative Risk Assessment    Patient Name: John Wilson  DOB: 09/19/1946 MRN: [id number]  HEARTCARE STAFF:  - IMPORTANT!!!!!! Under Visit Info/Reason for Call, type in Other and utilize the format Clearance MM/DD/YY or Clearance TBD. Do not use dashes or single digits. - Please review there is not already an duplicate clearance open for this procedure. - If request is for dental extraction, please clarify the # of teeth to be extracted. - If the patient is currently at the dentist's office, call Pre-Op Callback Staff (MA/nurse) to input urgent request.  - If the patient is not currently in the dentist office, please route to the Pre-Op pool.  Request for surgical clearance:  What type of surgery is being performed?  RIGHT TOTAL KNEE ARTHROPLASTY  When is this surgery scheduled?  TBD  What type of clearance is required (medical clearance vs. Pharmacy clearance to hold med vs. Both)?  BOTH  Are there any medications that need to be held prior to surgery and how long?  ASPIRIN  Practice name and name of physician performing surgery?  [facility] / DR. Wynelle Link   What is the office phone number?  [phone]   7.   What is the office fax number?  [phone]  8.   Anesthesia type (None, local, MAC, general) ?  CHOICE    Jeanann Lewandowsky 09/28/2021, 1:34 PM  _________________________________________________________________   (provider comments below)

## 2021-09-29 NOTE — Telephone Encounter (Signed)
Pt is scheduled to see Ermalinda Barrios, PA-C, 11/17/2021 and clearance will be addressed at that time.  I will route back to the requesting surgeon's office to make them aware.

## 2021-10-20 DIAGNOSIS — Z7984 Long term (current) use of oral hypoglycemic drugs: Secondary | ICD-10-CM | POA: Diagnosis not present

## 2021-10-20 DIAGNOSIS — I10 Essential (primary) hypertension: Secondary | ICD-10-CM | POA: Diagnosis not present

## 2021-10-20 DIAGNOSIS — Z86010 Personal history of colonic polyps: Secondary | ICD-10-CM | POA: Diagnosis not present

## 2021-10-20 DIAGNOSIS — E119 Type 2 diabetes mellitus without complications: Secondary | ICD-10-CM | POA: Diagnosis not present

## 2021-10-20 DIAGNOSIS — E669 Obesity, unspecified: Secondary | ICD-10-CM | POA: Diagnosis not present

## 2021-10-20 DIAGNOSIS — I2510 Atherosclerotic heart disease of native coronary artery without angina pectoris: Secondary | ICD-10-CM | POA: Diagnosis not present

## 2021-10-20 DIAGNOSIS — E782 Mixed hyperlipidemia: Secondary | ICD-10-CM | POA: Diagnosis not present

## 2021-10-20 DIAGNOSIS — G479 Sleep disorder, unspecified: Secondary | ICD-10-CM | POA: Diagnosis not present

## 2021-10-22 DIAGNOSIS — M47816 Spondylosis without myelopathy or radiculopathy, lumbar region: Secondary | ICD-10-CM | POA: Insufficient documentation

## 2021-10-25 DIAGNOSIS — M47816 Spondylosis without myelopathy or radiculopathy, lumbar region: Secondary | ICD-10-CM | POA: Diagnosis not present

## 2021-11-03 DIAGNOSIS — N289 Disorder of kidney and ureter, unspecified: Secondary | ICD-10-CM | POA: Diagnosis not present

## 2021-11-10 NOTE — Progress Notes (Addendum)
COVID swab appointment: 11-25-21 @ 8:45  COVID Vaccine Completed:  No Date COVID Vaccine completed: Has received booster: COVID vaccine manufacturer: [employer]   Date of COVID positive in last 90 days: No  PCP - Aura Dials, MD.  Office note on chart Cardiologist - Candee Furbish, MD.  Scheduled to see 11-17-21  Chest x-ray - N/A EKG - 04-13-21 Epic Stress Test - greater than 2 years ECHO - greater than 2 years ago Cardiac Cath - 03-16-20 Epic Pacemaker/ICD device last checked: Spinal Cord Stimulator:  Sleep Study - No CPAP -   Fasting Blood Sugar -  Checks Blood Sugar  - does not check   Blood Thinner Instructions: Aspirin Instructions:  ASA 81 mg.   To stop x1 week Last Dose:  Activity level:   Can go up a flight of stairs and perform activities of daily living without stopping and without symptoms of chest pain or shortness of breath.  Able to exercise without symptoms, patient walks daily.  Anesthesia review:  CAD, HTN, DM, murmur  Patient denies shortness of breath, fever, cough and chest pain at PAT appointment  Patient verbalized understanding of instructions that were given to them at the PAT appointment. Patient was also instructed that they will need to review over the PAT instructions again at home before surgery.

## 2021-11-10 NOTE — Progress Notes (Signed)
.   Cardiology Office Note    Date:  11/17/2021   ID:  John Wilson, DOB April 20, 1946, MRN [id number]   PCP:  Aura Dials, [place]  Cardiologist:  Candee Furbish, MD  Advanced Practice Provider:  No care team member to display Electrophysiologist:  None   [phone]   Chief Complaint  Patient presents with   Pre-op Exam    History of Present Illness:  John Wilson is a 75 y.o. male with history of CAD, RCA stent 2003, cath 03/2020 dRCA 70% medical management, HLD, labile HTN-followed by HTN clinic.  Patient here today for preop clearance for total knee by Dr. Wynelle Link 11/29/21.  Patient says BP is doing better since PCP adjusted when he takes his meds. Was running low. Denies chest pain, dyspnea, palpitations. Mild ankle edema on right from prior cellulitis and knee swelling. 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 Doesn't exercise outside of that.  Patient has been seen his PCP and had blood work that showed his kidney function was abnormal.  He held metformin and chlorthalidone for 2 weeks and had his blood rechecked I can see his creatinine was 1.06 on 11/03/2021 and potassium was 4.6.  Today's labs done for preop potassium is 3.2 and creatinine 1.36.  He says he stays hydrated.  He is on losartan.  He is not on a diuretic.    Past Medical History:  Diagnosis Date   Arthritis    CAD (coronary artery disease)    Diabetes ([place])    ED (erectile dysfunction)    Gout    Heart murmur    Hyperlipidemia    Hypertension    Obesity    Vertigo     Past Surgical History:  Procedure Laterality Date   [place] WITH STENT PLACEMENT  2001   2 separate surgeries, 1 month apart   HERNIA REPAIR  2023   Umbilical   LEFT HEART CATH AND CORONARY ANGIOGRAPHY N/A 03/16/2020   Procedure: LEFT HEART CATH AND CORONARY ANGIOGRAPHY;  Surgeon: Troy Sine, MD;   Location: [facility];  Service: Cardiovascular;  Laterality: N/A;   PILONIDAL CYST EXCISION  1979   TOTAL KNEE ARTHROPLASTY Left 03/08/2021   Procedure: TOTAL KNEE ARTHROPLASTY;  Surgeon: Gaynelle Arabian, MD;  Location: [facility];  Service: Orthopedics;  Laterality: Left;  3mn    Current Medications: Current Meds  Medication Sig   amLODipine (NORVASC) 5 MG tablet Take 1 tablet (5 mg total) by mouth daily.   aspirin EC 81 MG tablet Take 81 mg by mouth daily. Swallow whole.   atorvastatin (LIPITOR) 80 MG tablet Take 1 tablet (80 mg total) by mouth daily.   chlorthalidone (HYGROTON) 25 MG tablet Take 1 tablet (25 mg total) by mouth daily.   indomethacin (INDOCIN) 50 MG capsule Take 50 mg by mouth 2 (two) times daily as needed.   isosorbide mononitrate (IMDUR) 30 MG 24 hr tablet Take 1 tablet (30 mg total) by mouth daily.   losartan (COZAAR) 50 MG tablet Take 1 tablet (53m by mouth twice daily   metFORMIN (GLUCOPHAGE-XR) 500 MG 24 hr tablet Take 500-1,000 mg by mouth See admin instructions. Take 500 mg in the morning and 1000 mg in the evening   metoprolol succinate (TOPROL-XL) 50 MG 24 hr tablet Take 50 mg by mouth daily. Take with or immediately following a meal.  nitroGLYCERIN (NITROSTAT) 0.4 MG SL tablet Place 0.4 mg under the tongue every 5 (five) minutes as needed for chest pain.   potassium chloride SA (KLOR-CON M) 20 MEQ tablet Take 2 tablets by mouth today and take 2 tablets by mouth tomorrow, then hold until labs come back   senna (SENOKOT) 8.6 MG TABS tablet Take 1 tablet by mouth daily as needed for mild constipation.   zolpidem (AMBIEN CR) 12.5 MG CR tablet Take 12.5 mg by mouth at bedtime as needed for sleep.     Allergies:   Sulfa antibiotics   Social History   Socioeconomic History   Marital status: Married    Spouse name: Not on file   Number of children: Not on file   Years of education: Not on file   Highest education level: Not on file  Occupational History    Not on file  Tobacco Use   Smoking status: Former    Packs/day: 2.00    Years: 14.00    Pack years: 28.00    Types: Cigarettes    Quit date: 11/14/1982    Years since quitting: 39.0   Smokeless tobacco: Never   Tobacco comments:    started age 46  Vaping Use   Vaping Use: Never used  Substance and Sexual Activity   Alcohol use: No   Drug use: No   Sexual activity: Not on file  Other Topics Concern   Not on file  Social History Narrative   Not on file   Social Determinants of Health   Financial Resource Strain: Not on file  Food Insecurity: Not on file  Transportation Needs: Not on file  Physical Activity: Not on file  Stress: Not on file  Social Connections: Not on file     Family History:  The patient's  family history includes Sick sinus syndrome in his mother.   ROS:   Please see the history of present illness.    ROS All other systems reviewed and are negative.   PHYSICAL EXAM:   VS:  BP (!) 142/70    Pulse (!) 53    Ht 5' 10"  (1.778 m)    Wt 250 lb 3.2 oz (113.5 kg)    SpO2 97%    BMI 35.90 kg/m   Physical Exam  GEN: Obese, in no acute distress  Neck: no JVD, carotid bruits, or masses Cardiac:RRR; no murmurs, rubs, or gallops  Respiratory:  clear to auscultation bilaterally, normal work of breathing GI: soft, nontender, nondistended, + BS Ext: Brawny changes with mild right ankle edema, varicosities otherwise without cyanosis, clubbing. Good distal pulses bilaterally Neuro:  Alert and Oriented x 3 Psych: euthymic mood, full affect  Wt Readings from Last 3 Encounters:  11/17/21 250 lb 3.2 oz (113.5 kg)  11/17/21 250 lb 2 oz (113.5 kg)  05/28/21 249 lb 3.2 oz (113 kg)      Studies/Labs Reviewed:   EKG:  EKG is  ordered today.  The ekg ordered today demonstrates sinus bradycardia at 53 bpm nonspecific ST-T wave changes, no acute change  Recent Labs: 11/17/2021: ALT 26; BUN 28; Creatinine, Ser 1.36; Hemoglobin 14.4; Platelets 175; Potassium 3.2; Sodium  138   Lipid Panel No results found for: CHOL, TRIG, HDL, CHOLHDL, VLDL, LDLCALC, LDLDIRECT  Additional studies/ records that were reviewed today include:  Cath 03/2020: Diagnostic Dominance: Right     Risk Assessment/Calculations:         ASSESSMENT:    1. Preoperative clearance   2. Hypertension,  unspecified type   3. Coronary artery disease involving native coronary artery of native heart without angina pectoris   4. Hyperlipidemia, unspecified hyperlipidemia type      PLAN:  In order of problems listed above:  Preop clearance for right total knee arthorplasty by Dr. Wynelle Link 11/29/20 patient with known CAD on cardiac cath 03/2020 treated medically.  He remains extremely active and has no angina.  No need for further cardiac work-up prior to knee surgery According to the Revised Cardiac Risk Index (RCRI), his Perioperative Risk of Major Cardiac Event is (%): 0.9  His Functional Capacity in METs is: 8.33 according to the Duke Activity Status Index (DASI).   Labile HTN followed by HTN clinic but now well controlled.  CAD RCA stent 2003, cath 03/2020 medical management no angina  HLD LDL 80 but atorvastatin increased by PCP.  He has had a lot of lab work in the last couple weeks we will have to get that from them.  Goal is less than 55  Hypokalemia potassium low on preop labs today at the hospital.  He says it was low by Dr. Sheryn Bison and kidney function was up.  He held metformin and chlorthalidone for 2 weeks and is back on these.  I will replace his potassium.  Dr. Sheryn Bison is managing his kidneys.  Follow-up be met prior to surgery   Shared Decision Making/Informed Consent        Medication Adjustments/Labs and Tests Ordered: Current medicines are reviewed at length with the patient today.  Concerns regarding medicines are outlined above.  Medication changes, Labs and Tests ordered today are listed in the Patient Instructions below. Patient Instructions  Medication  Instructions:  Your physician has recommended you make the following change in your medication:   START Potassium 20 meq taking 2 today and 2 tomorrow and hold until you get your lab work done    *If you need a refill on your cardiac medications before your next appointment, please call your pharmacy*   Lab Work: Monday 11/22/21:  COME TO THE OFFICE FOR BMET (to recheck your potassium level)  If you have labs (blood work) drawn today and your tests are completely normal, you will receive your results only by: [place] (if you have MyChart) OR A paper copy in the mail If you have any lab test that is abnormal or we need to change your treatment, we will call you to review the results.   Testing/Procedures: None ordered   Follow-Up: At [facility], you and your health needs are our priority.  As part of our continuing mission to provide you with exceptional heart care, we have created designated Provider Care Teams.  These Care Teams include your primary Cardiologist (physician) and Advanced Practice Providers (APPs -  Physician Assistants and Nurse Practitioners) who all work together to provide you with the care you need, when you need it.  We recommend signing up for the patient portal called "MyChart".  Sign up information is provided on this After Visit Summary.  MyChart is used to connect with patients for Virtual Visits (Telemedicine).  Patients are able to view lab/test results, encounter notes, upcoming appointments, etc.  Non-urgent messages can be sent to your provider as well.   To learn more about what you can do with MyChart, go to [email or web address].    Your next appointment:   12 month(s)  The format for your next appointment:   In Person  Provider:   Candee Furbish, MD  Other Instructions    Signed, Ermalinda Barrios, PA-C  11/17/2021 12:53 PM    [facility] [address], [place], [place]  [address] Phone: [phone]; Fax:  [phone]

## 2021-11-10 NOTE — Patient Instructions (Addendum)
DUE TO COVID-19 ONLY ONE VISITOR IS ALLOWED TO COME WITH YOU AND STAY IN THE WAITING ROOM ONLY DURING PRE OP AND PROCEDURE.   **NO VISITORS ARE ALLOWED IN THE SHORT STAY AREA OR RECOVERY ROOM!!**  IF YOU WILL BE ADMITTED INTO THE HOSPITAL YOU ARE ALLOWED ONLY TWO SUPPORT PEOPLE DURING VISITATION HOURS ONLY (7 AM -8PM)    Up to two visitors ages 39+ are allowed at one time in a patient's room.  The visitors may rotate out with other people throughout the day.  Additionally, up to two children between the ages of 36 and 34 are allowed and do not count toward the number of allowed visitors.  Children within this age range must be accompanied by an adult visitor.  One adult visitor may remain with the patient overnight and must be in the room by 8 PM.  COVID SWAB TESTING MUST BE COMPLETED ON:  11-25-21 @ 8:45  COME IN THROUGH MAIN ENTRANCE of [facility].  Take a seat in the lobby area to the right as you come in the main entrance.  Call [phone]  and give your name and let them know you are here for COVID testing  You are not required to quarantine, however you are required to wear a well-fitted mask when you are out and around people not in your household.  Hand Hygiene often Do NOT share personal items Notify your provider if you are in close contact with someone who has COVID or you develop fever 100.4 or greater, new onset of sneezing, cough, sore throat, shortness of breath or body aches.   Your procedure is scheduled on:  Monday, 11-29-21   Report to [facility] Main  Entrance     Report to admitting at 12:00 PM   Call this number if you have problems the morning of surgery [phone]   Do not eat food :After Midnight.   May have liquids until 11:45 AM day of surgery  CLEAR LIQUID DIET  Foods Allowed                                                                     Foods Excluded  Water, Black Coffee (no milk/no creamer) and tea, regular and decaf                               liquids that you cannot  Plain Jell-O in any flavor  (No red)                         see through such as: Fruit ices (not with fruit pulp)                                 milk, soups, orange juice  Iced Popsicles (No red)                                    All solid food  Apple juices Sports drinks like Gatorade (No red) Lightly seasoned clear broth or consume(fat free) Sugar    Complete one G2 drink the morning of surgery at 11:45 AM the day of surgery.     The day of surgery:  Drink ONE (1) Pre-Surgery G2 by am the morning of surgery. Drink in one sitting. Do not sip.  This drink was given to you during your hospital  pre-op appointment visit. Nothing else to drink after completing the Pre-Surgery G2.          If you have questions, please contact your surgeons office.     Oral Hygiene is also important to reduce your risk of infection.                                    Remember - BRUSH YOUR TEETH THE MORNING OF SURGERY WITH YOUR REGULAR TOOTHPASTE   Do NOT smoke after Midnight  Take these medicines the morning of surgery with A SIP OF WATER:  Amlodipine, Atorvastatin, Indomethacin, Isosorbide, Metoprolol  WHAT DO I DO ABOUT MY DIABETES MEDICATION?  Do not take oral diabetes medicines (pills) the morning of surgery.  THE DAY BEFORE SURGERY:  Take Metformin as prescribed.  THE MORNING OF SURGERY: Do not take Metformin.  Reviewed and Endorsed by [facility] Patient Education Committee, August 2015    Stop all vitamins and herbal supplements a week before surgery              You may not have any metal on your body including  jewelry, and body piercing             Do not wear lotions, powders, cologne, or deodorant              Men may shave face and neck.  Do not bring valuables to the hospital. [place].   Contacts, dentures or bridgework may not be worn into surgery.   Bring small  overnight bag day of surgery.  Please read over the following fact sheets you were given: IF YOU HAVE QUESTIONS ABOUT YOUR PRE OP INSTRUCTIONS PLEASE CALL [facility] - Preparing for Surgery Before surgery, you can play an important role.  Because skin is not sterile, your skin needs to be as free of germs as possible.  You can reduce the number of germs on your skin by washing with CHG (chlorahexidine gluconate) soap before surgery.  CHG is an antiseptic cleaner which kills germs and bonds with the skin to continue killing germs even after washing. Please DO NOT use if you have an allergy to CHG or antibacterial soaps.  If your skin becomes reddened/irritated stop using the CHG and inform your nurse when you arrive at Short Stay. Do not shave (including legs and underarms) for at least 48 hours prior to the first CHG shower.  You may shave your face/neck.  Please follow these instructions carefully:  1.  Shower with CHG Soap the night before surgery and the  morning of surgery.  2.  If you choose to wash your hair, wash your hair first as usual with your normal  shampoo.  3.  After you shampoo, rinse your hair and body thoroughly to remove the shampoo.  4.  Use CHG as you would any other liquid soap.  You can apply chg directly to the skin and wash.  Gently with a scrungie or clean washcloth.  5.  Apply the CHG Soap to your body ONLY FROM THE NECK DOWN.   Do   not use on face/ open                           Wound or open sores. Avoid contact with eyes, ears mouth and   genitals (private parts).                       Wash face,  Genitals (private parts) with your normal soap.             6.  Wash thoroughly, paying special attention to the area where your    surgery  will be performed.  7.  Thoroughly rinse your body with warm water from the neck down.  8.  DO NOT shower/wash with your normal soap after using and rinsing off the CHG Soap.                 9.  Pat yourself dry with a clean towel.            10.  Wear clean pajamas.            11.  Place clean sheets on your bed the night of your first shower and do not  sleep with pets. Day of Surgery : Do not apply any lotions/deodorants the morning of surgery.  Please wear clean clothes to the hospital/surgery center.  FAILURE TO FOLLOW THESE INSTRUCTIONS MAY RESULT IN THE CANCELLATION OF YOUR SURGERY  PATIENT SIGNATURE_________________________________  NURSE SIGNATURE__________________________________  ________________________________________________________________________   Adam Phenix  An incentive spirometer is a tool that can help keep your lungs clear and active. This tool measures how well you are filling your lungs with each breath. Taking long deep breaths may help reverse or decrease the chance of developing breathing (pulmonary) problems (especially infection) following: A long period of time when you are unable to move or be active. BEFORE THE PROCEDURE  If the spirometer includes an indicator to show your best effort, your nurse or respiratory therapist will set it to a desired goal. If possible, sit up straight or lean slightly forward. Try not to slouch. Hold the incentive spirometer in an upright position. INSTRUCTIONS FOR USE  Sit on the edge of your bed if possible, or sit up as far as you can in bed or on a chair. Hold the incentive spirometer in an upright position. Breathe out normally. Place the mouthpiece in your mouth and seal your lips tightly around it. Breathe in slowly and as deeply as possible, raising the piston or the ball toward the top of the column. Hold your breath for 3-5 seconds or for as long as possible. Allow the piston or ball to fall to the bottom of the column. Remove the mouthpiece from your mouth and breathe out normally. Rest for a few seconds and repeat Steps 1 through 7 at least 10 times every 1-2 hours when you are awake.  Take your time and take a few normal breaths between deep breaths. The spirometer may include an indicator to show your best effort. Use the indicator as a goal to work toward during each repetition. After each set of 10 deep breaths, practice coughing to be sure  your lungs are clear. If you have an incision (the cut made at the time of surgery), support your incision when coughing by placing a pillow or rolled up towels firmly against it. Once you are able to get out of bed, walk around indoors and cough well. You may stop using the incentive spirometer when instructed by your caregiver.  RISKS AND COMPLICATIONS Take your time so you do not get dizzy or light-headed. If you are in pain, you may need to take or ask for pain medication before doing incentive spirometry. It is harder to take a deep breath if you are having pain. AFTER USE Rest and breathe slowly and easily. It can be helpful to keep track of a log of your progress. Your caregiver can provide you with a simple table to help with this. If you are using the spirometer at home, follow these instructions: [place] IF:  You are having difficultly using the spirometer. You have trouble using the spirometer as often as instructed. Your pain medication is not giving enough relief while using the spirometer. You develop fever of 100.5 F (38.1 C) or higher. SEEK IMMEDIATE MEDICAL CARE IF:  You cough up bloody sputum that had not been present before. You develop fever of 102 F (38.9 C) or greater. You develop worsening pain at or near the incision site. MAKE SURE YOU:  Understand these instructions. Will watch your condition. Will get help right away if you are not doing well or get worse. Document Released: 03/13/2007 Document Revised: 01/23/2012 Document Reviewed: 05/14/2007 [facility] Patient Information 2014 [place], [place].   ________________________________________________________________________

## 2021-11-17 ENCOUNTER — Encounter: Payer: Self-pay | Admitting: Physician Assistant

## 2021-11-17 ENCOUNTER — Ambulatory Visit: Payer: Medicare HMO | Admitting: Physician Assistant

## 2021-11-17 ENCOUNTER — Encounter (HOSPITAL_COMMUNITY)
Admission: RE | Admit: 2021-11-17 | Discharge: 2021-11-17 | Disposition: A | Discharge status: Home / Self Care | Payer: Medicare HMO | Source: Ambulatory Visit | Attending: Orthopedic Surgery | Admitting: Orthopedic Surgery

## 2021-11-17 ENCOUNTER — Encounter (HOSPITAL_COMMUNITY): Payer: Self-pay

## 2021-11-17 ENCOUNTER — Other Ambulatory Visit: Payer: Self-pay

## 2021-11-17 VITALS — BP 149/76 | HR 58 | Temp 98.2°F | Resp 18 | Ht 70.5 in | Wt 250.1 lb

## 2021-11-17 VITALS — BP 142/70 | HR 53 | Ht 70.0 in | Wt 250.2 lb

## 2021-11-17 DIAGNOSIS — I2510 Atherosclerotic heart disease of native coronary artery without angina pectoris: Secondary | ICD-10-CM | POA: Diagnosis not present

## 2021-11-17 DIAGNOSIS — Z87891 Personal history of nicotine dependence: Secondary | ICD-10-CM | POA: Diagnosis not present

## 2021-11-17 DIAGNOSIS — I10 Essential (primary) hypertension: Secondary | ICD-10-CM | POA: Insufficient documentation

## 2021-11-17 DIAGNOSIS — Z01812 Encounter for preprocedural laboratory examination: Secondary | ICD-10-CM | POA: Insufficient documentation

## 2021-11-17 DIAGNOSIS — E119 Type 2 diabetes mellitus without complications: Secondary | ICD-10-CM | POA: Insufficient documentation

## 2021-11-17 DIAGNOSIS — E785 Hyperlipidemia, unspecified: Secondary | ICD-10-CM

## 2021-11-17 DIAGNOSIS — Z20822 Contact with and (suspected) exposure to covid-19: Secondary | ICD-10-CM | POA: Insufficient documentation

## 2021-11-17 DIAGNOSIS — Z01818 Encounter for other preprocedural examination: Secondary | ICD-10-CM

## 2021-11-17 DIAGNOSIS — M1711 Unilateral primary osteoarthritis, right knee: Secondary | ICD-10-CM | POA: Insufficient documentation

## 2021-11-17 LAB — COMPREHENSIVE METABOLIC PANEL
ALT: 26 U/L (ref 0–44)
AST: 27 U/L (ref 15–41)
Albumin: 3.8 g/dL (ref 3.5–5.0)
Alkaline Phosphatase: 62 U/L (ref 38–126)
Anion gap: 8 (ref 5–15)
BUN: 28 mg/dL — ABNORMAL HIGH (ref 8–23)
CO2: 27 mmol/L (ref 22–32)
Calcium: 8.9 mg/dL (ref 8.9–10.3)
Chloride: 103 mmol/L (ref 98–111)
Creatinine, Ser: 1.36 mg/dL — ABNORMAL HIGH (ref 0.61–1.24)
GFR, Estimated: 55 mL/min — ABNORMAL LOW (ref >60)
Glucose, Bld: 134 mg/dL — ABNORMAL HIGH (ref 70–99)
Potassium: 3.2 mmol/L — ABNORMAL LOW (ref 3.5–5.1)
Sodium: 138 mmol/L (ref 135–145)
Total Bilirubin: 0.8 mg/dL (ref 0.3–1.2)
Total Protein: 6.5 g/dL (ref 6.5–8.1)

## 2021-11-17 LAB — PROTIME-INR
INR: 1 (ref 0.8–1.2)
Prothrombin Time: 13 seconds (ref 11.4–15.2)

## 2021-11-17 LAB — CBC
HCT: 42.6 % (ref 39.0–52.0)
Hemoglobin: 14.4 g/dL (ref 13.0–17.0)
MCH: 30.6 pg (ref 26.0–34.0)
MCHC: 33.8 g/dL (ref 30.0–36.0)
MCV: 90.6 fL (ref 80.0–100.0)
Platelets: 175 10*3/uL (ref 150–400)
RBC: 4.7 MIL/uL (ref 4.22–5.81)
RDW: 14.4 % (ref 11.5–15.5)
WBC: 7.1 10*3/uL (ref 4.0–10.5)
nRBC: 0 % (ref 0.0–0.2)

## 2021-11-17 LAB — GLUCOSE, CAPILLARY: Glucose-Capillary: 137 mg/dL — ABNORMAL HIGH (ref 70–99)

## 2021-11-17 LAB — SURGICAL PCR SCREEN
MRSA, PCR: NEGATIVE
Staphylococcus aureus: NEGATIVE

## 2021-11-17 MED ORDER — POTASSIUM CHLORIDE CRYS ER 20 MEQ PO TBCR: EXTENDED_RELEASE_TABLET | ORAL | 0 refills | Status: DC

## 2021-11-17 NOTE — Patient Instructions (Addendum)
Medication Instructions:  Your physician has recommended you make the following change in your medication:   START Potassium 20 meq taking 2 today and 2 tomorrow and hold until you get your lab work done    *If you need a refill on your cardiac medications before your next appointment, please call your pharmacy*   Lab Work: Monday 11/22/21:  COME TO THE OFFICE FOR BMET (to recheck your potassium level)  If you have labs (blood work) drawn today and your tests are completely normal, you will receive your results only by: [place] (if you have MyChart) OR A paper copy in the mail If you have any lab test that is abnormal or we need to change your treatment, we will call you to review the results.   Testing/Procedures: None ordered   Follow-Up: At [facility], you and your health needs are our priority.  As part of our continuing mission to provide you with exceptional heart care, we have created designated Provider Care Teams.  These Care Teams include your primary Cardiologist (physician) and Advanced Practice Providers (APPs -  Physician Assistants and Nurse Practitioners) who all work together to provide you with the care you need, when you need it.  We recommend signing up for the patient portal called "MyChart".  Sign up information is provided on this After Visit Summary.  MyChart is used to connect with patients for Virtual Visits (Telemedicine).  Patients are able to view lab/test results, encounter notes, upcoming appointments, etc.  Non-urgent messages can be sent to your provider as well.   To learn more about what you can do with MyChart, go to [email or web address].    Your next appointment:   12 month(s)  The format for your next appointment:   In Person  Provider:   Candee Furbish, MD    Other Instructions

## 2021-11-18 DIAGNOSIS — M5136 Other intervertebral disc degeneration, lumbar region: Secondary | ICD-10-CM | POA: Diagnosis not present

## 2021-11-19 NOTE — Anesthesia Preprocedure Evaluation (Addendum)
Anesthesia Evaluation  Patient identified by MRN, date of birth, ID band Patient awake    Reviewed: Allergy & Precautions, H&P , NPO status , Patient's Chart, lab work & pertinent test results, reviewed documented beta blocker date and time   Airway Mallampati: II  TM Distance: >3 FB Neck ROM: Full    Dental no notable dental hx. (+) Partial Lower, Partial Upper, Dental Advisory Given   Pulmonary neg pulmonary ROS, former smoker,    Pulmonary exam normal breath sounds clear to auscultation       Cardiovascular hypertension, Pt. on medications and Pt. on home beta blockers + CAD and + Cardiac Stents   Rhythm:Regular Rate:Normal     Neuro/Psych negative neurological ROS  negative psych ROS   GI/Hepatic negative GI ROS, Neg liver ROS,   Endo/Other  diabetes, Type 2, Oral Hypoglycemic Agents  Renal/GU negative Renal ROS  negative genitourinary   Musculoskeletal  (+) Arthritis , Osteoarthritis,    Abdominal   Peds  Hematology negative hematology ROS (+)   Anesthesia Other Findings   Reproductive/Obstetrics negative OB ROS                           Anesthesia Physical Anesthesia Plan  ASA: 3  Anesthesia Plan: Spinal   Post-op Pain Management: Regional block and Ofirmev IV (intra-op)   Induction: Intravenous  PONV Risk Score and Plan: 2 and Propofol infusion and Ondansetron  Airway Management Planned: Natural Airway and Simple Face Mask  Additional Equipment:   Intra-op Plan:   Post-operative Plan:   Informed Consent: I have reviewed the patients History and Physical, chart, labs and discussed the procedure including the risks, benefits and alternatives for the proposed anesthesia with the patient or authorized representative who has indicated his/her understanding and acceptance.     Dental advisory given  Plan Discussed with: CRNA  Anesthesia Plan Comments: (See PAT note  11/17/2021, Konrad Felix Ward, PA-C)       Anesthesia Quick Evaluation

## 2021-11-19 NOTE — Progress Notes (Signed)
Anesthesia Chart Review   Case: [id number] Date/Time: 11/29/21 1435   Procedure: TOTAL KNEE ARTHROPLASTY (Right: Knee)   Anesthesia type: Choice   Pre-op diagnosis: right knee osteoarthritis   Location: [facility] ROOM 10 / [facility]   Surgeons: Gaynelle Arabian, MD       DISCUSSION:76 y.o. former smoker with h/o HTN, DM II, CAD (stent 2003, cath 03/2020 dRCA 70% medical management), right knee OA scheduled for above procedure 11/29/2021 with Dr. Gaynelle Arabian.   Pt last seen by cardiology 11/17/2021. Per OV note, "Preop clearance for right total knee arthorplasty by Dr. Wynelle Link 11/29/20 patient with known CAD on cardiac cath 03/2020 treated medically.  He remains extremely active and has no angina.  No need for further cardiac work-up prior to knee surgery According to the Revised Cardiac Risk Index (RCRI), his Perioperative Risk of Major Cardiac Event is (%): 0.9   His Functional Capacity in METs is: 8.33 according to the Duke Activity Status Index (DASI)."  Anticipate pt can proceed with planned procedure barring acute status change.   VS: BP (!) 149/76    Pulse (!) 58    Temp 36.8 C (Oral)    Resp 18    Ht 5' 10.5" (1.791 m)    Wt 113.5 kg    SpO2 97%    BMI 35.38 kg/m   PROVIDERS: Aura Dials, MD is PCP   Cardiologist:  Candee Furbish, MD  LABS: Labs reviewed: Acceptable for surgery. (all labs ordered are listed, but only abnormal results are displayed)  Labs Reviewed  COMPREHENSIVE METABOLIC PANEL - Abnormal; Notable for the following components:      Result Value   Potassium 3.2 (*)    Glucose, Bld 134 (*)    BUN 28 (*)    Creatinine, Ser 1.36 (*)    GFR, Estimated 55 (*)    All other components within normal limits  GLUCOSE, CAPILLARY - Abnormal; Notable for the following components:   Glucose-Capillary 137 (*)    All other components within normal limits  SURGICAL PCR SCREEN  CBC  PROTIME-INR     IMAGES:   EKG: 11/17/2021 Rate 53 bpm  Sinus bradycardia   CV: Cardiac  Cath 03/16/2020 Dist RCA-1 lesion is 70% stenosed. Previously placed Dist RCA-2 stent (unknown type) is widely patent. Previously placed 1st Mrg stent (unknown type) is widely patent. The left ventricular ejection fraction is 55-65% by visual estimate. The left ventricular systolic function is normal.   Single-vessel coronary artery disease in this patient with a normal left main which trifurcates into an LAD, ramus intermediate, and left circumflex vessel.   The LAD does not reach the apex and is mild luminal irregularity in the mid segment without significant stenoses.   The ramus immediate vessel appears to have a widely patent proximal stent prior to its bifurcation.   The left circumflex vessels angiographically normal and gives rise to 2 marginal vessels.   Very large tortuous dominant RCA which has several sharp angles and beyond the sharp angled segment after the acute margin and before the takeoff of the PDA and posterior lateral vessel there appears to be a 70% stenosis.  There is a patent stent which is hard to discern prior to the takeoff of the PDA.  The PDA and PLA vessels are extremely large and extend all the way to the LV apex.   Normal LV function with EF estimate at least 60% without segmental wall motion abnormalities.  LVEDP 15 mmHg.   RECOMMENDATION: Increase  medical therapy trial.  Recommend resumption of amlodipine 5 mg at bedtime; the patient will continue with metoprolol in a.m. as prescribed by Dr. Marlou Porch.  Consider additional anti-ischemic medicines if necessary.  Due to the RCA vessel tortuosity, PCI would be difficult and with his asymptomatic status with reference to chest pain and normal wall motion initial increase medical therapy was recommended.  Will need aggressive lipid management with target LDL less than 70 and preferably 50 or below in attempt to induce plaque regression.  Patient will follow up with Dr. Marlou Porch.. Past Medical History:  Diagnosis Date    Arthritis    CAD (coronary artery disease)    Diabetes ([place])    ED (erectile dysfunction)    Gout    Heart murmur    Hyperlipidemia    Hypertension    Obesity    Vertigo     Past Surgical History:  Procedure Laterality Date   [place] WITH STENT PLACEMENT  2001   2 separate surgeries, 1 month apart   HERNIA REPAIR  [id number]   Umbilical   LEFT HEART CATH AND CORONARY ANGIOGRAPHY N/A 03/16/2020   Procedure: LEFT HEART CATH AND CORONARY ANGIOGRAPHY;  Surgeon: Troy Sine, MD;  Location: [place] [facility];  Service: Cardiovascular;  Laterality: N/A;   PILONIDAL CYST EXCISION  1979   TOTAL KNEE ARTHROPLASTY Left 03/08/2021   Procedure: TOTAL KNEE ARTHROPLASTY;  Surgeon: Gaynelle Arabian, MD;  Location: [facility];  Service: Orthopedics;  Laterality: Left;  28min    MEDICATIONS:  amLODipine (NORVASC) 5 MG tablet   aspirin EC 81 MG tablet   atorvastatin (LIPITOR) 80 MG tablet   chlorthalidone (HYGROTON) 25 MG tablet   indomethacin (INDOCIN) 50 MG capsule   isosorbide mononitrate (IMDUR) 30 MG 24 hr tablet   losartan (COZAAR) 50 MG tablet   metFORMIN (GLUCOPHAGE-XR) 500 MG 24 hr tablet   metoprolol succinate (TOPROL-XL) 50 MG 24 hr tablet   nitroGLYCERIN (NITROSTAT) 0.4 MG SL tablet   potassium chloride SA (KLOR-CON M) 20 MEQ tablet   senna (SENOKOT) 8.6 MG TABS tablet   zolpidem (AMBIEN CR) 12.5 MG CR tablet   No current facility-administered medications for this encounter.    Konrad Felix Ward, PA-C [facility] [phone]

## 2021-11-22 ENCOUNTER — Other Ambulatory Visit: Payer: Self-pay

## 2021-11-22 ENCOUNTER — Other Ambulatory Visit: Payer: Medicare HMO | Admitting: *Deleted

## 2021-11-22 DIAGNOSIS — Z01818 Encounter for other preprocedural examination: Secondary | ICD-10-CM

## 2021-11-22 DIAGNOSIS — E785 Hyperlipidemia, unspecified: Secondary | ICD-10-CM | POA: Diagnosis not present

## 2021-11-22 DIAGNOSIS — I10 Essential (primary) hypertension: Secondary | ICD-10-CM | POA: Diagnosis not present

## 2021-11-22 DIAGNOSIS — I2510 Atherosclerotic heart disease of native coronary artery without angina pectoris: Secondary | ICD-10-CM

## 2021-11-22 LAB — BASIC METABOLIC PANEL
BUN/Creatinine Ratio: 20 (ref 10–24)
BUN: 25 mg/dL (ref 8–27)
CO2: 32 mmol/L — ABNORMAL HIGH (ref 20–29)
Calcium: 9.6 mg/dL (ref 8.6–10.2)
Chloride: 100 mmol/L (ref 96–106)
Creatinine, Ser: 1.25 mg/dL (ref 0.76–1.27)
Glucose: 133 mg/dL — ABNORMAL HIGH (ref 70–99)
Potassium: 3.8 mmol/L (ref 3.5–5.2)
Sodium: 139 mmol/L (ref 134–144)
eGFR: 60 mL/min/{1.73_m2} (ref >59)

## 2021-11-25 ENCOUNTER — Other Ambulatory Visit: Payer: Self-pay

## 2021-11-25 ENCOUNTER — Encounter (HOSPITAL_COMMUNITY)
Admission: RE | Admit: 2021-11-25 | Discharge: 2021-11-25 | Disposition: A | Discharge status: Home / Self Care | Payer: Medicare HMO | Source: Ambulatory Visit | Attending: Orthopedic Surgery | Admitting: Orthopedic Surgery

## 2021-11-25 DIAGNOSIS — Z20822 Contact with and (suspected) exposure to covid-19: Secondary | ICD-10-CM | POA: Insufficient documentation

## 2021-11-25 DIAGNOSIS — Z01812 Encounter for preprocedural laboratory examination: Secondary | ICD-10-CM | POA: Diagnosis not present

## 2021-11-25 DIAGNOSIS — Z01818 Encounter for other preprocedural examination: Secondary | ICD-10-CM

## 2021-11-25 LAB — SARS CORONAVIRUS 2 (TAT 6-24 HRS): SARS Coronavirus 2: NEGATIVE

## 2021-11-28 NOTE — H&P (Signed)
TOTAL KNEE ADMISSION H&P  Patient is being admitted for right total knee arthroplasty.  Subjective:  Chief Complaint: Right knee pain.  HPI: John Wilson, 76 y.o. male has a history of pain and functional disability in the right knee due to arthritis and has failed non-surgical conservative treatments for greater than 12 weeks to include NSAID's and/or analgesics, flexibility and strengthening excercises, and activity modification. Onset of symptoms was gradual, starting  several  years ago with gradually worsening course since that time. The patient noted no past surgery on the right knee.  Patient currently rates pain in the right knee at 7 out of 10 with activity. Patient has worsening of pain with activity and weight bearing, pain that interferes with activities of daily living, pain with passive range of motion, and crepitus. Patient has evidence of periarticular osteophytes and joint space narrowing by imaging studies.There is no active infection.  Patient Active Problem List   Diagnosis Date Noted   Hypertension 05/03/2021   OA (osteoarthritis) of knee 03/08/2021   Primary osteoarthritis of left knee 03/08/2021   Abdominal aortic ectasia (HCC) 12/31/2020   Constipation 12/31/2020   Idiopathic gout 12/31/2020   Mixed hyperlipidemia 12/31/2020   Morbid obesity ([place]) 12/31/2020   Personal history of colonic polyps 12/31/2020   Polyneuropathy in diseases classified elsewhere ([place]) 12/31/2020   Type 2 diabetes mellitus with other circulatory complications ([place]) 27/04/2375   Diabetes mellitus with coincident hypertension ([place]) 09/16/2019   Coronary artery disease involving native coronary artery of native heart without angina pectoris 09/16/2019   Pure hypercholesterolemia 09/16/2019   Incarcerated umbilical hernia 28/31/5176    Past Medical History:  Diagnosis Date   Arthritis    CAD (coronary artery disease)    Diabetes ([place])    ED (erectile dysfunction)    Gout    Heart  murmur    Hyperlipidemia    Hypertension    Obesity    Vertigo     Past Surgical History:  Procedure Laterality Date   CHOLECYSTECTOMY  1981   CORONARY ANGIOPLASTY WITH STENT PLACEMENT  2001   2 separate surgeries, 1 month apart   HERNIA REPAIR  1607   Umbilical   LEFT HEART CATH AND CORONARY ANGIOGRAPHY N/A 03/16/2020   Procedure: LEFT HEART CATH AND CORONARY ANGIOGRAPHY;  Surgeon: Troy Sine, MD;  Location: [place] [facility];  Service: Cardiovascular;  Laterality: N/A;   PILONIDAL CYST EXCISION  1979   TOTAL KNEE ARTHROPLASTY Left 03/08/2021   Procedure: TOTAL KNEE ARTHROPLASTY;  Surgeon: Gaynelle Arabian, MD;  [facility];  Service: Orthopedics;  Laterality: Left;  55min    Prior to Admission medications   Medication Sig Start Date End Date Taking? Authorizing Provider  amLODipine (NORVASC) 5 MG tablet Take 1 tablet (5 mg total) by mouth daily. 04/13/21  Yes Jerline Pain, MD  aspirin EC 81 MG tablet Take 81 mg by mouth daily. Swallow whole.   Yes [provider]  atorvastatin (LIPITOR) 80 MG tablet Take 1 tablet (80 mg total) by mouth daily. 09/16/19  Yes Jerline Pain, MD  chlorthalidone (HYGROTON) 25 MG tablet Take 1 tablet (25 mg total) by mouth daily. 05/28/21 11/17/21 Yes Jerline Pain, MD  indomethacin (INDOCIN) 50 MG capsule Take 50 mg by mouth 2 (two) times daily as needed. 10/20/21  Yes [provider]  isosorbide mononitrate (IMDUR) 30 MG 24 hr tablet Take 1 tablet (30 mg total) by mouth daily. 03/18/20  Yes Jerline Pain, MD  losartan (COZAAR) 50 MG tablet Take 1 tablet (50mg ) by mouth twice daily 05/28/21  Yes Jerline Pain, MD  metFORMIN (GLUCOPHAGE-XR) 500 MG 24 hr tablet Take 500-1,000 mg by mouth See admin instructions. Take 500 mg in the morning and 1000 mg in the evening 05/08/16  Yes [provider]  metoprolol succinate (TOPROL-XL) 50 MG 24 hr tablet Take 50 mg by mouth daily. Take with or immediately following a meal.   Yes [provider]  nitroGLYCERIN (NITROSTAT) 0.4 MG SL tablet Place 0.4 mg under the tongue every 5 (five) minutes as needed for chest pain.   Yes [provider]  senna (SENOKOT) 8.6 MG TABS tablet Take 1 tablet by mouth daily as needed for mild constipation.   Yes [provider]  zolpidem (AMBIEN CR) 12.5 MG CR tablet Take 12.5 mg by mouth at bedtime as needed for sleep. 10/31/21  Yes [provider]  potassium chloride SA (KLOR-CON M) 20 MEQ tablet Take 2 tablets by mouth today and take 2 tablets by mouth tomorrow, then hold until labs come back 11/17/21   Imogene Burn, PA-C    Allergies  Allergen Reactions   Sulfa Antibiotics Hives    Social History   Socioeconomic History   Marital status: Married    Spouse name: Not on file   Number of children: Not on file   Years of education: Not on file   Highest education level: Not on file  Occupational History   Not on file  Tobacco Use   Smoking status: Former    Packs/day: 2.00    Years: 14.00    Pack years: 28.00    Types: Cigarettes    Quit date: 11/14/1982    Years since quitting: 39.0   Smokeless tobacco: Never   Tobacco comments:    started age 48  Vaping Use   Vaping Use: Never used  Substance and Sexual Activity   Alcohol use: No   Drug use: No   Sexual activity: Not on file  Other Topics Concern   Not on file  Social History Narrative   Not on file   Social Determinants of Health   Financial Resource Strain: Not on file  Food Insecurity: Not on file  Transportation Needs: Not on file  Physical Activity: Not on file  Stress: Not on file  Social Connections: Not on file  Intimate Partner Violence: Not on file    Tobacco Use: Medium Risk   Smoking Tobacco Use: Former   Smokeless Tobacco Use: Never   Passive Exposure: Not on file   Social History   Substance and Sexual Activity  Alcohol Use No    Family History  Problem Relation Age of Onset   Sick sinus syndrome Mother         pacemaker    ROS: Constitutional: no fever, no chills, no night sweats, no significant weight loss Cardiovascular: no chest pain, no palpitations Respiratory: no cough, no shortness of breath, No COPD Gastrointestinal: no vomiting, no nausea Musculoskeletal: no swelling in Joints, Joint Pain Neurologic: no numbness, no tingling, no difficulty with balance   Objective:  Physical Exam: Well nourished and well developed.  General: Alert and oriented x3, cooperative and pleasant, no acute distress.  Head: normocephalic, atraumatic, neck supple.  Eyes: EOMI.  Respiratory: breath sounds clear in all fields, no wheezing, rales, or rhonchi. Cardiovascular: Regular rate and rhythm, no murmurs, gallops or rubs.  Abdomen: non-tender to palpation and soft, normoactive bowel sounds.  Musculoskeletal:  The patient has an antalgic gait pattern favoring the right side.     Right Knee Exam:   No effusion present. No swelling present.   The range of motion is: 5 to 120 degrees.   Marked crepitus on range of motion of the knee.   Positive medial joint line tenderness.   Positive lateral joint line tenderness.   The knee is stable.   Calves soft and nontender. Motor function intact in LE. Strength 5/5 LE bilaterally. Neuro: Distal pulses 2+. Sensation to light touch intact in LE.    Vital signs in last 24 hours:    Imaging Review  Radiographs- AP and lateral of the bilateral knees dated 09/23/2021 demonstrate bone-on-bone arthritis in the medial and patellofemoral compartments of the right knee with large osteophyte formation. The left knee AP view shows the prosthesis in excellent position with no periprosthetic abnormalities.  Assessment/Plan:  End stage arthritis, right knee   The patient history, physical examination, clinical judgment of the provider and imaging studies are consistent with end stage degenerative joint disease of the right knee and total knee arthroplasty is  deemed medically necessary. The treatment options including medical management, injection therapy arthroscopy and arthroplasty were discussed at length. The risks and benefits of total knee arthroplasty were presented and reviewed. The risks due to aseptic loosening, infection, stiffness, patella tracking problems, thromboembolic complications and other imponderables were discussed. The patient acknowledged the explanation, agreed to proceed with the plan and consent was signed. Patient is being admitted for inpatient treatment for surgery, pain control, PT, OT, prophylactic antibiotics, VTE prophylaxis, progressive ambulation and ADLs and discharge planning. The patient is planning to be discharged  home .   Patient's anticipated LOS is less than 2 midnights, meeting these requirements: - Younger than 71 - Lives within 1 hour of care - Has a competent adult at home to recover with post-op recover - NO history of  - Chronic pain requiring opiods  - Diabetes  - Coronary Artery Disease  - Heart failure  - Heart attack  - Stroke  - DVT/VTE  - Cardiac arrhythmia  - Respiratory Failure/COPD  - Renal failure  - Anemia  - Advanced Liver disease    Therapy Plans: [facility] Disposition: Home with Wife Planned DVT Prophylaxis: Aspirin 325mg  DME Needed: None PCP: Dr. Sheryn Bison - clearance received from PCP; Coronary Artery Disease Cardiology clearance recommended - patient seeing tomorrow.  Cardiologist: Dr. Marlou Porch - seeing on 11/17/20 for clearance TXA: IV Allergies: Sulfa  Anesthesia Concerns: None BMI: 36.9 Last HgbA1c: 6.4  Pharmacy: [facility] [address]  - Patient was instructed on what medications to stop prior to surgery. - Follow-up visit in 2 weeks with Dr. Wynelle Link - Begin physical therapy following surgery - Pre-operative lab work as pre-surgical testing - Prescriptions will be provided in hospital at time of discharge  Fenton Foy, [facility], PA-C Orthopedic  Surgery [facility] [place]

## 2021-11-29 ENCOUNTER — Observation Stay (HOSPITAL_COMMUNITY)
Admission: RE | Admit: 2021-11-29 | Discharge: 2021-11-30 | Disposition: A | Discharge status: Home / Self Care | Payer: Medicare HMO | Source: Ambulatory Visit | Attending: Orthopedic Surgery | Admitting: Orthopedic Surgery

## 2021-11-29 ENCOUNTER — Encounter (HOSPITAL_COMMUNITY): Payer: Self-pay | Admitting: Orthopedic Surgery

## 2021-11-29 ENCOUNTER — Other Ambulatory Visit: Payer: Self-pay

## 2021-11-29 ENCOUNTER — Ambulatory Visit (HOSPITAL_COMMUNITY): Payer: Medicare HMO | Admitting: Physician Assistant

## 2021-11-29 ENCOUNTER — Encounter (HOSPITAL_COMMUNITY)
Admission: RE | Disposition: A | Discharge status: Home / Self Care | Payer: Self-pay | Source: Ambulatory Visit | Attending: Orthopedic Surgery

## 2021-11-29 ENCOUNTER — Ambulatory Visit (HOSPITAL_COMMUNITY): Payer: Medicare HMO | Admitting: Registered Nurse

## 2021-11-29 DIAGNOSIS — Z96652 Presence of left artificial knee joint: Secondary | ICD-10-CM | POA: Insufficient documentation

## 2021-11-29 DIAGNOSIS — Z79899 Other long term (current) drug therapy: Secondary | ICD-10-CM | POA: Diagnosis not present

## 2021-11-29 DIAGNOSIS — E119 Type 2 diabetes mellitus without complications: Secondary | ICD-10-CM | POA: Insufficient documentation

## 2021-11-29 DIAGNOSIS — I10 Essential (primary) hypertension: Secondary | ICD-10-CM | POA: Diagnosis not present

## 2021-11-29 DIAGNOSIS — Z87891 Personal history of nicotine dependence: Secondary | ICD-10-CM | POA: Diagnosis not present

## 2021-11-29 DIAGNOSIS — M179 Osteoarthritis of knee, unspecified: Secondary | ICD-10-CM | POA: Diagnosis present

## 2021-11-29 DIAGNOSIS — I2510 Atherosclerotic heart disease of native coronary artery without angina pectoris: Secondary | ICD-10-CM | POA: Insufficient documentation

## 2021-11-29 DIAGNOSIS — M1711 Unilateral primary osteoarthritis, right knee: Secondary | ICD-10-CM | POA: Diagnosis not present

## 2021-11-29 DIAGNOSIS — Z7982 Long term (current) use of aspirin: Secondary | ICD-10-CM | POA: Diagnosis not present

## 2021-11-29 DIAGNOSIS — Z7984 Long term (current) use of oral hypoglycemic drugs: Secondary | ICD-10-CM | POA: Diagnosis not present

## 2021-11-29 DIAGNOSIS — G8918 Other acute postprocedural pain: Secondary | ICD-10-CM | POA: Diagnosis not present

## 2021-11-29 HISTORY — PX: TOTAL KNEE ARTHROPLASTY: SHX125

## 2021-11-29 LAB — GLUCOSE, CAPILLARY
Glucose-Capillary: 128 mg/dL — ABNORMAL HIGH (ref 70–99)
Glucose-Capillary: 219 mg/dL — ABNORMAL HIGH (ref 70–99)
Glucose-Capillary: 244 mg/dL — ABNORMAL HIGH (ref 70–99)

## 2021-11-29 LAB — HEMOGLOBIN A1C
Hgb A1c MFr Bld: 6.6 % — ABNORMAL HIGH (ref 4.8–5.6)
Mean Plasma Glucose: 142.72 mg/dL

## 2021-11-29 SURGERY — ARTHROPLASTY, KNEE, TOTAL
Anesthesia: Spinal | Site: Knee | Laterality: Right

## 2021-11-29 MED ORDER — FLEET ENEMA 7-19 GM/118ML RE ENEM: 1.0000 | ENEMA | Freq: Once | RECTAL | Status: DC | PRN

## 2021-11-29 MED ORDER — CEFAZOLIN SODIUM-DEXTROSE 2-4 GM/100ML-% IV SOLN
2.0000 g | Freq: Four times a day (QID) | INTRAVENOUS | Status: AC
  Administered 2021-11-29 (×2): 2 g via INTRAVENOUS
  Filled 2021-11-29 (×2): qty 100

## 2021-11-29 MED ORDER — BUPIVACAINE LIPOSOME 1.3 % IJ SUSP: 20.0000 mL | Freq: Once | INTRAMUSCULAR | Status: DC

## 2021-11-29 MED ORDER — SODIUM CHLORIDE 0.9 % IR SOLN
Status: DC | PRN
  Administered 2021-11-29: 1000 mL

## 2021-11-29 MED ORDER — LACTATED RINGERS IV SOLN: INTRAVENOUS | Status: DC

## 2021-11-29 MED ORDER — OXYCODONE HCL 5 MG PO TABS
5.0000 mg | ORAL_TABLET | ORAL | Status: DC | PRN
  Administered 2021-11-29 (×2): 10 mg via ORAL
  Administered 2021-11-30 (×2): 5 mg via ORAL
  Filled 2021-11-29: qty 1
  Filled 2021-11-29: qty 2
  Filled 2021-11-29: qty 1
  Filled 2021-11-29: qty 2

## 2021-11-29 MED ORDER — METOPROLOL SUCCINATE ER 50 MG PO TB24
50.0000 mg | ORAL_TABLET | Freq: Every day | ORAL | Status: DC
Start: 2021-11-30 — End: 2021-11-30
  Administered 2021-11-30: 50 mg via ORAL
  Filled 2021-11-29: qty 1

## 2021-11-29 MED ORDER — DOCUSATE SODIUM 100 MG PO CAPS
100.0000 mg | ORAL_CAPSULE | Freq: Two times a day (BID) | ORAL | Status: DC
  Administered 2021-11-29 – 2021-11-30 (×2): 100 mg via ORAL
  Filled 2021-11-29 (×2): qty 1

## 2021-11-29 MED ORDER — ACETAMINOPHEN 10 MG/ML IV SOLN
1000.0000 mg | Freq: Four times a day (QID) | INTRAVENOUS | Status: DC
  Administered 2021-11-29: 1000 mg via INTRAVENOUS
  Filled 2021-11-29: qty 100

## 2021-11-29 MED ORDER — POVIDONE-IODINE 10 % EX SWAB
2.0000 "application " | Freq: Once | CUTANEOUS | Status: AC
  Administered 2021-11-29: 2 via TOPICAL

## 2021-11-29 MED ORDER — NITROGLYCERIN 0.4 MG SL SUBL: 0.4000 mg | SUBLINGUAL_TABLET | SUBLINGUAL | Status: DC | PRN

## 2021-11-29 MED ORDER — HYDROMORPHONE HCL 1 MG/ML IJ SOLN: 0.2500 mg | INTRAMUSCULAR | Status: DC | PRN

## 2021-11-29 MED ORDER — FENTANYL CITRATE PF 50 MCG/ML IJ SOSY: 50.0000 ug | PREFILLED_SYRINGE | Freq: Once | INTRAMUSCULAR | Status: AC

## 2021-11-29 MED ORDER — ISOSORBIDE MONONITRATE ER 30 MG PO TB24
30.0000 mg | ORAL_TABLET | Freq: Every day | ORAL | Status: DC
Start: 2021-11-30 — End: 2021-11-30
  Administered 2021-11-30: 30 mg via ORAL
  Filled 2021-11-29: qty 1

## 2021-11-29 MED ORDER — ONDANSETRON HCL 4 MG/2ML IJ SOLN
INTRAMUSCULAR | Status: DC | PRN
  Administered 2021-11-29: 4 mg via INTRAVENOUS

## 2021-11-29 MED ORDER — SODIUM CHLORIDE (PF) 0.9 % IJ SOLN
INTRAMUSCULAR | Status: DC | PRN
  Administered 2021-11-29: 60 mL

## 2021-11-29 MED ORDER — EPHEDRINE SULFATE-NACL 50-0.9 MG/10ML-% IV SOSY
PREFILLED_SYRINGE | INTRAVENOUS | Status: DC | PRN
Start: 2021-11-29 — End: 2021-11-29
  Administered 2021-11-29: 10 mg via INTRAVENOUS

## 2021-11-29 MED ORDER — GABAPENTIN 300 MG PO CAPS
300.0000 mg | ORAL_CAPSULE | Freq: Three times a day (TID) | ORAL | Status: DC
  Administered 2021-11-29 – 2021-11-30 (×3): 300 mg via ORAL
  Filled 2021-11-29 (×3): qty 1

## 2021-11-29 MED ORDER — ORAL CARE MOUTH RINSE: 15.0000 mL | Freq: Once | OROMUCOSAL | Status: AC

## 2021-11-29 MED ORDER — 0.9 % SODIUM CHLORIDE (POUR BTL) OPTIME
TOPICAL | Status: DC | PRN
  Administered 2021-11-29: 1000 mL

## 2021-11-29 MED ORDER — METHOCARBAMOL 500 MG PO TABS
500.0000 mg | ORAL_TABLET | Freq: Four times a day (QID) | ORAL | Status: DC | PRN
  Administered 2021-11-30 (×2): 500 mg via ORAL
  Filled 2021-11-29 (×3): qty 1

## 2021-11-29 MED ORDER — MIDAZOLAM HCL 2 MG/2ML IJ SOLN: 1.0000 mg | Freq: Once | INTRAMUSCULAR | Status: AC

## 2021-11-29 MED ORDER — PROPOFOL 10 MG/ML IV BOLUS
INTRAVENOUS | Status: AC
  Filled 2021-11-29: qty 20

## 2021-11-29 MED ORDER — CHLORHEXIDINE GLUCONATE 0.12 % MT SOLN
15.0000 mL | Freq: Once | OROMUCOSAL | Status: AC
  Administered 2021-11-29: 15 mL via OROMUCOSAL

## 2021-11-29 MED ORDER — LOSARTAN POTASSIUM 50 MG PO TABS
50.0000 mg | ORAL_TABLET | Freq: Every day | ORAL | Status: DC
  Administered 2021-11-30: 50 mg via ORAL
  Filled 2021-11-29: qty 1

## 2021-11-29 MED ORDER — PROPOFOL 500 MG/50ML IV EMUL
INTRAVENOUS | Status: DC | PRN
  Administered 2021-11-29: 75 ug/kg/min via INTRAVENOUS

## 2021-11-29 MED ORDER — SODIUM CHLORIDE (PF) 0.9 % IJ SOLN
INTRAMUSCULAR | Status: AC
  Filled 2021-11-29: qty 10

## 2021-11-29 MED ORDER — BISACODYL 10 MG RE SUPP: 10.0000 mg | Freq: Every day | RECTAL | Status: DC | PRN

## 2021-11-29 MED ORDER — BUPIVACAINE IN DEXTROSE 0.75-8.25 % IT SOLN
INTRATHECAL | Status: DC | PRN
  Administered 2021-11-29: 1.6 mL via INTRATHECAL

## 2021-11-29 MED ORDER — INSULIN ASPART 100 UNIT/ML IJ SOLN
0.0000 [IU] | Freq: Three times a day (TID) | INTRAMUSCULAR | Status: DC
  Administered 2021-11-29: 5 [IU] via SUBCUTANEOUS
  Administered 2021-11-30: 2 [IU] via SUBCUTANEOUS

## 2021-11-29 MED ORDER — MENTHOL 3 MG MT LOZG: 1.0000 | LOZENGE | OROMUCOSAL | Status: DC | PRN

## 2021-11-29 MED ORDER — METOCLOPRAMIDE HCL 5 MG/ML IJ SOLN: 5.0000 mg | Freq: Three times a day (TID) | INTRAMUSCULAR | Status: DC | PRN

## 2021-11-29 MED ORDER — METHOCARBAMOL 500 MG IVPB - SIMPLE MED
500.0000 mg | Freq: Four times a day (QID) | INTRAVENOUS | Status: DC | PRN
  Administered 2021-11-29: 500 mg via INTRAVENOUS
  Filled 2021-11-29: qty 50
  Filled 2021-11-29: qty 500

## 2021-11-29 MED ORDER — BUPIVACAINE-EPINEPHRINE (PF) 0.5% -1:200000 IJ SOLN
INTRAMUSCULAR | Status: DC | PRN
  Administered 2021-11-29: 20 mL via PERINEURAL

## 2021-11-29 MED ORDER — BUPIVACAINE LIPOSOME 1.3 % IJ SUSP
INTRAMUSCULAR | Status: DC | PRN
  Administered 2021-11-29: 20 mL

## 2021-11-29 MED ORDER — ZOLPIDEM TARTRATE 5 MG PO TABS: 5.0000 mg | ORAL_TABLET | Freq: Every evening | ORAL | Status: DC | PRN

## 2021-11-29 MED ORDER — PHENOL 1.4 % MT LIQD: 1.0000 | OROMUCOSAL | Status: DC | PRN

## 2021-11-29 MED ORDER — CHLORTHALIDONE 25 MG PO TABS
25.0000 mg | ORAL_TABLET | Freq: Every day | ORAL | Status: DC
  Administered 2021-11-30: 25 mg via ORAL
  Filled 2021-11-29: qty 1

## 2021-11-29 MED ORDER — ASPIRIN EC 325 MG PO TBEC
325.0000 mg | DELAYED_RELEASE_TABLET | Freq: Two times a day (BID) | ORAL | Status: DC
  Administered 2021-11-30: 325 mg via ORAL
  Filled 2021-11-29: qty 1

## 2021-11-29 MED ORDER — TRANEXAMIC ACID-NACL 1000-0.7 MG/100ML-% IV SOLN
1000.0000 mg | INTRAVENOUS | Status: AC
  Administered 2021-11-29: 1000 mg via INTRAVENOUS
  Filled 2021-11-29: qty 100

## 2021-11-29 MED ORDER — ONDANSETRON HCL 4 MG/2ML IJ SOLN: 4.0000 mg | Freq: Four times a day (QID) | INTRAMUSCULAR | Status: DC | PRN

## 2021-11-29 MED ORDER — ONDANSETRON HCL 4 MG PO TABS: 4.0000 mg | ORAL_TABLET | Freq: Four times a day (QID) | ORAL | Status: DC | PRN

## 2021-11-29 MED ORDER — PHENYLEPHRINE HCL-NACL 20-0.9 MG/250ML-% IV SOLN
INTRAVENOUS | Status: DC | PRN
  Administered 2021-11-29: 50 ug/min via INTRAVENOUS

## 2021-11-29 MED ORDER — AMLODIPINE BESYLATE 5 MG PO TABS
5.0000 mg | ORAL_TABLET | Freq: Every day | ORAL | Status: DC
  Administered 2021-11-30: 5 mg via ORAL
  Filled 2021-11-29: qty 1

## 2021-11-29 MED ORDER — FENTANYL CITRATE PF 50 MCG/ML IJ SOSY
PREFILLED_SYRINGE | INTRAMUSCULAR | Status: AC
  Administered 2021-11-29: 50 ug via INTRAVENOUS
  Filled 2021-11-29: qty 2

## 2021-11-29 MED ORDER — POLYETHYLENE GLYCOL 3350 17 G PO PACK: 17.0000 g | PACK | Freq: Every day | ORAL | Status: DC | PRN

## 2021-11-29 MED ORDER — ACETAMINOPHEN 500 MG PO TABS
1000.0000 mg | ORAL_TABLET | Freq: Four times a day (QID) | ORAL | Status: DC
  Administered 2021-11-29 – 2021-11-30 (×3): 1000 mg via ORAL
  Filled 2021-11-29 (×3): qty 2

## 2021-11-29 MED ORDER — METOCLOPRAMIDE HCL 5 MG PO TABS: 5.0000 mg | ORAL_TABLET | Freq: Three times a day (TID) | ORAL | Status: DC | PRN

## 2021-11-29 MED ORDER — INSULIN ASPART 100 UNIT/ML IJ SOLN
0.0000 [IU] | Freq: Every day | INTRAMUSCULAR | Status: DC
  Administered 2021-11-29: 2 [IU] via SUBCUTANEOUS

## 2021-11-29 MED ORDER — DIPHENHYDRAMINE HCL 12.5 MG/5ML PO ELIX: 12.5000 mg | ORAL_SOLUTION | ORAL | Status: DC | PRN

## 2021-11-29 MED ORDER — MIDAZOLAM HCL 2 MG/2ML IJ SOLN
INTRAMUSCULAR | Status: AC
  Administered 2021-11-29: 1 mg via INTRAVENOUS
  Filled 2021-11-29: qty 2

## 2021-11-29 MED ORDER — CEFAZOLIN SODIUM-DEXTROSE 2-4 GM/100ML-% IV SOLN
2.0000 g | INTRAVENOUS | Status: AC
  Administered 2021-11-29: 2 g via INTRAVENOUS
  Filled 2021-11-29: qty 100

## 2021-11-29 MED ORDER — PROPOFOL 10 MG/ML IV BOLUS
INTRAVENOUS | Status: DC | PRN
  Administered 2021-11-29: 20 mg via INTRAVENOUS
  Administered 2021-11-29: 30 mg via INTRAVENOUS

## 2021-11-29 MED ORDER — BUPIVACAINE LIPOSOME 1.3 % IJ SUSP
INTRAMUSCULAR | Status: AC
  Filled 2021-11-29: qty 20

## 2021-11-29 MED ORDER — SODIUM CHLORIDE 0.9 % IV SOLN: INTRAVENOUS | Status: DC

## 2021-11-29 MED ORDER — TRAMADOL HCL 50 MG PO TABS
50.0000 mg | ORAL_TABLET | Freq: Four times a day (QID) | ORAL | Status: DC | PRN
  Administered 2021-11-29 – 2021-11-30 (×2): 50 mg via ORAL
  Filled 2021-11-29 (×2): qty 1

## 2021-11-29 MED ORDER — MORPHINE SULFATE (PF) 2 MG/ML IV SOLN: 0.5000 mg | INTRAVENOUS | Status: DC | PRN

## 2021-11-29 MED ORDER — ATORVASTATIN CALCIUM 40 MG PO TABS
80.0000 mg | ORAL_TABLET | Freq: Every day | ORAL | Status: DC
  Administered 2021-11-30: 80 mg via ORAL
  Filled 2021-11-29: qty 2

## 2021-11-29 MED ORDER — DEXAMETHASONE SODIUM PHOSPHATE 10 MG/ML IJ SOLN
INTRAMUSCULAR | Status: AC
  Filled 2021-11-29: qty 1

## 2021-11-29 MED ORDER — DEXAMETHASONE SODIUM PHOSPHATE 10 MG/ML IJ SOLN
8.0000 mg | Freq: Once | INTRAMUSCULAR | Status: AC
  Administered 2021-11-29: 8 mg via INTRAVENOUS

## 2021-11-29 MED ORDER — SENNA 8.6 MG PO TABS: 1.0000 | ORAL_TABLET | Freq: Every day | ORAL | Status: DC | PRN

## 2021-11-29 MED ORDER — ONDANSETRON HCL 4 MG/2ML IJ SOLN
INTRAMUSCULAR | Status: AC
  Filled 2021-11-29: qty 2

## 2021-11-29 MED ORDER — PROPOFOL 1000 MG/100ML IV EMUL
INTRAVENOUS | Status: AC
  Filled 2021-11-29: qty 100

## 2021-11-29 SURGICAL SUPPLY — 57 items
ATTUNE MED DOME PAT 41 KNEE (Knees) ×1 IMPLANT
ATTUNE PS FEM RT SZ 8 CEM KNEE (Femur) ×1 IMPLANT
ATTUNE PSRP INSR SZ8 8 KNEE (Insert) ×1 IMPLANT
BAG COUNTER SPONGE SURGICOUNT (BAG) IMPLANT
BAG SPEC THK2 15X12 ZIP CLS (MISCELLANEOUS) ×1
BAG SPNG CNTER NS LX DISP (BAG)
BAG ZIPLOCK 12X15 (MISCELLANEOUS) ×2 IMPLANT
BASE TIBIAL ROT PLAT SZ 8 KNEE (Knees) IMPLANT
BLADE SAG 18X100X1.27 (BLADE) ×2 IMPLANT
BLADE SAW SGTL 11.0X1.19X90.0M (BLADE) ×2 IMPLANT
BNDG CMPR MED 10X6 ELC LF (GAUZE/BANDAGES/DRESSINGS) ×1
BNDG ELASTIC 6X10 VLCR STRL LF (GAUZE/BANDAGES/DRESSINGS) ×1 IMPLANT
BNDG ELASTIC 6X5.8 VLCR STR LF (GAUZE/BANDAGES/DRESSINGS) ×2 IMPLANT
BOWL SMART MIX CTS (DISPOSABLE) ×2 IMPLANT
BSPLAT TIB 8 CMNT ROT PLAT STR (Knees) ×1 IMPLANT
CEMENT HV SMART SET (Cement) ×4 IMPLANT
COVER SURGICAL LIGHT HANDLE (MISCELLANEOUS) ×2 IMPLANT
CUFF TOURN SGL QUICK 34 (TOURNIQUET CUFF) ×2
CUFF TRNQT CYL 34X4.125X (TOURNIQUET CUFF) ×1 IMPLANT
DECANTER SPIKE VIAL GLASS SM (MISCELLANEOUS) ×2 IMPLANT
DRAPE INCISE IOBAN 66X45 STRL (DRAPES) ×2 IMPLANT
DRAPE U-SHAPE 47X51 STRL (DRAPES) ×2 IMPLANT
DRSG AQUACEL AG ADV 3.5X10 (GAUZE/BANDAGES/DRESSINGS) ×2 IMPLANT
DURAPREP 26ML APPLICATOR (WOUND CARE) ×2 IMPLANT
ELECT REM PT RETURN 15FT ADLT (MISCELLANEOUS) ×2 IMPLANT
GLOVE SRG 8 PF TXTR STRL LF DI (GLOVE) ×1 IMPLANT
GLOVE SURG ENC MOIS LTX SZ6.5 (GLOVE) ×2 IMPLANT
GLOVE SURG ENC MOIS LTX SZ8 (GLOVE) ×4 IMPLANT
GLOVE SURG UNDER POLY LF SZ7 (GLOVE) ×2 IMPLANT
GLOVE SURG UNDER POLY LF SZ8 (GLOVE) ×2
GLOVE SURG UNDER POLY LF SZ8.5 (GLOVE) ×2 IMPLANT
GOWN STRL REUS W/TWL LRG LVL3 (GOWN DISPOSABLE) ×4 IMPLANT
GOWN STRL REUS W/TWL XL LVL3 (GOWN DISPOSABLE) ×2 IMPLANT
HANDPIECE INTERPULSE COAX TIP (DISPOSABLE) ×2
HOLDER FOLEY CATH W/STRAP (MISCELLANEOUS) IMPLANT
IMMOBILIZER KNEE 20 (SOFTGOODS) ×2 IMPLANT
IMMOBILIZER KNEE 20 THIGH 36 (SOFTGOODS) ×1 IMPLANT
KIT TURNOVER KIT A (KITS) IMPLANT
MANIFOLD NEPTUNE II (INSTRUMENTS) ×2 IMPLANT
NS IRRIG 1000ML POUR BTL (IV SOLUTION) ×2 IMPLANT
PACK TOTAL KNEE CUSTOM (KITS) ×2 IMPLANT
PADDING CAST COTTON 6X4 STRL (CAST SUPPLIES) ×3 IMPLANT
PROTECTOR NERVE ULNAR (MISCELLANEOUS) ×2 IMPLANT
SET HNDPC FAN SPRY TIP SCT (DISPOSABLE) ×1 IMPLANT
SPONGE T-LAP 18X18 ~~LOC~~+RFID (SPONGE) ×6 IMPLANT
STRIP CLOSURE SKIN 1/2X4 (GAUZE/BANDAGES/DRESSINGS) ×4 IMPLANT
SUT MNCRL AB 4-0 PS2 18 (SUTURE) ×2 IMPLANT
SUT STRATAFIX 0 PDS 27 VIOLET (SUTURE) ×2
SUT VIC AB 2-0 CT1 27 (SUTURE) ×6
SUT VIC AB 2-0 CT1 TAPERPNT 27 (SUTURE) ×3 IMPLANT
SUTURE STRATFX 0 PDS 27 VIOLET (SUTURE) ×1 IMPLANT
TAPE STRIPS DRAPE STRL (GAUZE/BANDAGES/DRESSINGS) ×1 IMPLANT
TIBIAL BASE ROT PLAT SZ 8 KNEE (Knees) ×2 IMPLANT
TRAY FOLEY MTR SLVR 16FR STAT (SET/KITS/TRAYS/PACK) ×2 IMPLANT
TUBE SUCTION HIGH CAP CLEAR NV (SUCTIONS) ×2 IMPLANT
WATER STERILE IRR 1000ML POUR (IV SOLUTION) ×4 IMPLANT
WRAP KNEE MAXI GEL POST OP (GAUZE/BANDAGES/DRESSINGS) ×2 IMPLANT

## 2021-11-29 NOTE — Care Plan (Signed)
Ortho Bundle Case Management Note  Patient Details  Name: John Wilson MRN: [id number] Date of Birth: 06-07-1946  R TKA on 11-29-21 DCP:  Home with wife DME:  No needs, has a RW PT:  [facility] on 12-02-21.                   DME Arranged:  N/A DME Agency:  NA  HH Arranged:  NA HH Agency:  NA  Additional Comments: Please contact me with any questions of if this plan should need to change.  Marianne Sofia, RN,CCM [facility]  [phone] 11/29/2021, 10:50 AM

## 2021-11-29 NOTE — Anesthesia Procedure Notes (Addendum)
Spinal  Patient location during procedure: OR Start time: 11/29/2021 10:25 AM End time: 11/29/2021 10:30 AM Reason for block: surgical anesthesia Staffing Performed: resident/CRNA  Anesthesiologist: Roderic Palau, MD Resident/CRNA: Talbot Grumbling, CRNA Preanesthetic Checklist Completed: patient identified, IV checked, site marked, risks and benefits discussed, surgical consent, monitors and equipment checked, pre-op evaluation and timeout performed Spinal Block Patient position: sitting Prep: DuraPrep Patient monitoring: heart rate, cardiac monitor, continuous pulse ox and blood pressure Approach: midline Location: L3-4 Injection technique: single-shot Needle Needle type: Pencan  Needle gauge: 24 G Needle length: 9 cm Assessment Sensory level: T4 Events: CSF return Additional Notes Clear CSF, no paresthesia, patient tolerated well.

## 2021-11-29 NOTE — Anesthesia Procedure Notes (Signed)
Anesthesia Regional Block: Adductor canal block   Pre-Anesthetic Checklist: , timeout performed,  Correct Patient, Correct Site, Correct Laterality,  Correct Procedure, Correct Position, site marked,  Risks and benefits discussed,  Pre-op evaluation,  At surgeon's request and post-op pain management  Laterality: Right  Prep: Maximum Sterile Barrier Precautions used, chloraprep       Needles:  Injection technique: Single-shot  Needle Type: Echogenic Stimulator Needle     Needle Length: 9cm  Needle Gauge: 21     Additional Needles:   Procedures:,,,, ultrasound used (permanent image in chart),,    Narrative:  Start time: 11/29/2021 9:16 AM End time: 11/29/2021 9:26 AM Injection made incrementally with aspirations every 5 mL.  Performed by: Personally  Anesthesiologist: Roderic Palau, MD

## 2021-11-29 NOTE — Interval H&P Note (Signed)
History and Physical Interval Note:  11/29/2021 8:30 AM  John Wilson  has presented today for surgery, with the diagnosis of right knee osteoarthritis.  The various methods of treatment have been discussed with the patient and family. After consideration of risks, benefits and other options for treatment, the patient has consented to  Procedure(s): TOTAL KNEE ARTHROPLASTY (Right) as a surgical intervention.  The patient's history has been reviewed, patient examined, no change in status, stable for surgery.  I have reviewed the patient's chart and labs.  Questions were answered to the patient's satisfaction.     Pilar Plate Tina Temme

## 2021-11-29 NOTE — Progress Notes (Signed)
Orthopedic Tech Progress Note Patient Details:  John Wilson 1946/05/19 [id number]  CPM Right Knee CPM Right Knee: On Right Knee Flexion (Degrees): 40 Right Knee Extension (Degrees): 10  Post Interventions Patient Tolerated: Well  [name] 11/29/2021, 2:30 PM

## 2021-11-29 NOTE — Progress Notes (Signed)
AssistedDr. Edmond Fitzgerald with right, ultrasound guided, adductor canal block. Side rails up, monitors on throughout procedure. See vital signs in flow sheet. Tolerated Procedure well.  

## 2021-11-29 NOTE — Anesthesia Postprocedure Evaluation (Signed)
Anesthesia Post Note  Patient: John Wilson  Procedure(s) Performed: TOTAL KNEE ARTHROPLASTY (Right: Knee)     Patient location during evaluation: PACU Anesthesia Type: Spinal and Regional Level of consciousness: oriented and awake and alert Pain management: pain level controlled Vital Signs Assessment: post-procedure vital signs reviewed and stable Respiratory status: spontaneous breathing and respiratory function stable Cardiovascular status: blood pressure returned to baseline and stable Postop Assessment: no headache, no backache, no apparent nausea or vomiting, spinal receding and patient able to bend at knees Anesthetic complications: no   No notable events documented.  Last Vitals:  Vitals:   11/29/21 1245 11/29/21 1300  BP: (!) 99/45 120/69  Pulse: (!) 43 (!) 51  Resp: 15 17  Temp:    SpO2: 100% 97%    Last Pain:  Vitals:   11/29/21 1300  TempSrc:   PainSc: 0-No pain                 Sallee Hogrefe,W. EDMOND

## 2021-11-29 NOTE — Op Note (Signed)
OPERATIVE REPORT-TOTAL KNEE ARTHROPLASTY   Pre-operative diagnosis- Osteoarthritis  Right knee(s)  Post-operative diagnosis- Osteoarthritis Right knee(s)  Procedure-  Right  Total Knee Arthroplasty  Surgeon- Dione Plover. Oreoluwa Gilmer, MD  Assistant- Theresa Duty, PA-C   Anesthesia-   Adductor canal block and spinal  EBL-25 ml   Drains None  Tourniquet time-  Total Tourniquet Time Documented: Thigh (Right) - 45 minutes Total: Thigh (Right) - 45 minutes     Complications- None  Condition-PACU - hemodynamically stable.   Brief Clinical Note  John Wilson is a 76 y.o. year old male with end stage OA of his right knee with progressively worsening pain and dysfunction. He has constant pain, with activity and at rest and significant functional deficits with difficulties even with ADLs. He has had extensive non-op management including analgesics, injections of cortisone and viscosupplements, and home exercise program, but remains in significant pain with significant dysfunction. Radiographs show bone on bone arthritis medial and patellofemoral. He presents now for right Total Knee Arthroplasty.     Procedure in detail---   The patient is brought into the operating room and positioned supine on the operating table. After successful administration of  Adductor canal block and spinal,   a tourniquet is placed high on the  Right thigh(s) and the lower extremity is prepped and draped in the usual sterile fashion. Time out is performed by the operating team and then the  Right lower extremity is wrapped in Esmarch, knee flexed and the tourniquet inflated to 300 mmHg.       A midline incision is made with a ten blade through the subcutaneous tissue to the level of the extensor mechanism. A fresh blade is used to make a medial parapatellar arthrotomy. Soft tissue over the proximal medial tibia is subperiosteally elevated to the joint line with a knife and into the semimembranosus bursa with a Cobb  elevator. Soft tissue over the proximal lateral tibia is elevated with attention being paid to avoiding the patellar tendon on the tibial tubercle. The patella is everted, knee flexed 90 degrees and the ACL and PCL are removed. Findings are bone on bone medial and patellofemoral with large global osteophytes        The drill is used to create a starting hole in the distal femur and the canal is thoroughly irrigated with sterile saline to remove the fatty contents. The 5 degree Right  valgus alignment guide is placed into the femoral canal and the distal femoral cutting block is pinned to remove 9 mm off the distal femur. Resection is made with an oscillating saw.      The tibia is subluxed forward and the menisci are removed. The extramedullary alignment guide is placed referencing proximally at the medial aspect of the tibial tubercle and distally along the second metatarsal axis and tibial crest. The block is pinned to remove 59mm off the more deficient medial  side. Resection is made with an oscillating saw. Size 8is the most appropriate size for the tibia and the proximal tibia is prepared with the modular drill and keel punch for that size.      The femoral sizing guide is placed and size 8 is most appropriate. Rotation is marked off the epicondylar axis and confirmed by creating a rectangular flexion gap at 90 degrees. The size 8 cutting block is pinned in this rotation and the anterior, posterior and chamfer cuts are made with the oscillating saw. The intercondylar block is then placed and that cut is made.  Trial size 8 tibial component, trial size 8 posterior stabilized femur and a 8  mm posterior stabilized rotating platform insert trial is placed. Full extension is achieved with excellent varus/valgus and anterior/posterior balance throughout full range of motion. The patella is everted and thickness measured to be 27  mm. Free hand resection is taken to 15 mm, a 41 template is placed, lug holes  are drilled, trial patella is placed, and it tracks normally. Osteophytes are removed off the posterior femur with the trial in place. All trials are removed and the cut bone surfaces prepared with pulsatile lavage. Cement is mixed and once ready for implantation, the size 8 tibial implant, size  8 posterior stabilized femoral component, and the size 41 patella are cemented in place and the patella is held with the clamp. The trial insert is placed and the knee held in full extension. The Exparel (20 ml mixed with 60 ml saline) is injected into the extensor mechanism, posterior capsule, medial and lateral gutters and subcutaneous tissues.  All extruded cement is removed and once the cement is hard the permanent 8 mm posterior stabilized rotating platform insert is placed into the tibial tray.      The wound is copiously irrigated with saline solution and the extensor mechanism closed with # 0 Stratofix suture. The tourniquet is released for a total tourniquet time of 45  minutes. Flexion against gravity is 140 degrees and the patella tracks normally. Subcutaneous tissue is closed with 2.0 vicryl and subcuticular with running 4.0 Monocryl. The incision is cleaned and dried and steri-strips and a bulky sterile dressing are applied. The limb is placed into a knee immobilizer and the patient is awakened and transported to recovery in stable condition.      Please note that a surgical assistant was a medical necessity for this procedure in order to perform it in a safe and expeditious manner. Surgical assistant was necessary to retract the ligaments and vital neurovascular structures to prevent injury to them and also necessary for proper positioning of the limb to allow for anatomic placement of the prosthesis.   Dione Plover Keiffer Piper, MD    11/29/2021, 11:43 AM

## 2021-11-29 NOTE — Evaluation (Signed)
Physical Therapy Evaluation Patient Details Name: John Wilson MRN: [id number] DOB: 04-02-1946 Today's Date: 11/29/2021  History of Present Illness  76 YO male, S/P RTKA on 11/29/2021, S/P LTKA, vertigo, DM, CAD  Clinical Impression  The patient  ambulated x 90' did not require KI. Patient should progress well to Dc  tomorrow. Pt admitted with above diagnosis.  Pt currently with functional limitations due to the deficits listed below (see PT Problem List). Pt will benefit from skilled PT to increase their independence and safety with mobility to allow discharge to the venue listed below.          Recommendations for follow up therapy are one component of a multi-disciplinary discharge planning process, led by the attending physician.  Recommendations may be updated based on patient status, additional functional criteria and insurance authorization.  Follow Up Recommendations Follow physician's recommendations for discharge plan and follow up therapies    Assistance Recommended at Discharge Intermittent Supervision/Assistance  Patient can return home with the following  Help with stairs or ramp for entrance    Equipment Recommendations None recommended by PT  Recommendations for Other Services       Functional Status Assessment Patient has had a recent decline in their functional status and demonstrates the ability to make significant improvements in function in a reasonable and predictable amount of time.     Precautions / Restrictions Precautions Precautions: Knee;Fall Precaution Comments: did not use KI Required Braces or Orthoses: Knee Immobilizer - Right Restrictions Weight Bearing Restrictions: No      Mobility  Bed Mobility Overal bed mobility: Modified Independent                  Transfers Overall transfer level: Needs assistance Equipment used: Rolling walker (2 wheels) Transfers: Sit to/from Stand Sit to Stand: Supervision           General  transfer comment: cues for hand placement.    Ambulation/Gait Ambulation/Gait assistance: Min guard Gait Distance (Feet): 90 Feet Assistive device: Rolling walker (2 wheels) Gait Pattern/deviations: Step-to pattern;Step-through pattern Gait velocity: decr     General Gait Details: patient improved with  sequencing, right kne steady, no buckling  Stairs            Wheelchair Mobility    Modified Rankin (Stroke Patients Only)       Balance Overall balance assessment: No apparent balance deficits (not formally assessed)                                           Pertinent Vitals/Pain Pain Assessment: 0-10 Pain Score: 2  Pain Location: right knee Pain Descriptors / Indicators: Discomfort Pain Intervention(s): Monitored during session;Premedicated before session;Ice applied    Home Living Family/patient expects to be discharged to:: Private residence Living Arrangements: Spouse/significant other Available Help at Discharge: Family Type of Home: House Home Access: Stairs to enter Entrance Stairs-Rails: None Entrance Stairs-Number of Steps: 2   Home Layout: One level Home Equipment: Conservation officer, nature (2 wheels);Cane - single point;BSC/3in1      Prior Function Prior Level of Function : Independent/Modified Independent                     Hand Dominance   Dominant Hand: Right    Extremity/Trunk Assessment   Upper Extremity Assessment Upper Extremity Assessment: Overall WFL for tasks assessed    Lower Extremity Assessment  Lower Extremity Assessment: RLE deficits/detail RLE Deficits / Details: + SLR, knee flexion 5-80    Cervical / Trunk Assessment Cervical / Trunk Assessment: Normal  Communication   Communication: No difficulties  Cognition Arousal/Alertness: Awake/alert Behavior During Therapy: WFL for tasks assessed/performed Overall Cognitive Status: Within Functional Limits for tasks assessed                                           General Comments      Exercises     Assessment/Plan    PT Assessment Patient needs continued PT services  PT Problem List Decreased strength;Decreased knowledge of precautions;Decreased range of motion;Decreased mobility;Decreased activity tolerance;Pain       PT Treatment Interventions DME instruction;Therapeutic activities;Gait training;Therapeutic exercise;Patient/family education;Stair training;Functional mobility training    PT Goals (Current goals can be found in the Care Plan section)  Acute Rehab PT Goals Patient Stated Goal: to  go home PT Goal Formulation: With patient Time For Goal Achievement: 12/06/21 Potential to Achieve Goals: Good    Frequency 7X/week     Co-evaluation               AM-PAC PT "6 Clicks" Mobility  Outcome Measure Help needed turning from your back to your side while in a flat bed without using bedrails?: None Help needed moving from lying on your back to sitting on the side of a flat bed without using bedrails?: None Help needed moving to and from a bed to a chair (including a wheelchair)?: A Little Help needed standing up from a chair using your arms (e.g., wheelchair or bedside chair)?: A Little Help needed to walk in hospital room?: A Little Help needed climbing 3-5 steps with a railing? : A Little 6 Click Score: 20    End of Session Equipment Utilized During Treatment: Gait belt Activity Tolerance: Patient tolerated treatment well Patient left: in chair;with call bell/phone within reach;with chair alarm set Nurse Communication: Mobility status PT Visit Diagnosis: Unsteadiness on feet (R26.81);Difficulty in walking, not elsewhere classified (R26.2);Pain Pain - Right/Left: Right Pain - part of body: Knee    Time: 8502-7741 PT Time Calculation (min) (ACUTE ONLY): 23 min   Charges:   PT Evaluation $PT Eval Low Complexity: 1 Low PT Treatments $Gait Training: 8-22 mins        Tresa Endo  PT Acute Rehabilitation Services Pager [phone] Office [phone]   Claretha Cooper 11/29/2021, 4:47 PM

## 2021-11-29 NOTE — Transfer of Care (Signed)
Immediate Anesthesia Transfer of Care Note  Patient: John Wilson  Procedure(s) Performed: TOTAL KNEE ARTHROPLASTY (Right: Knee)  Patient Location: PACU  Anesthesia Type:Spinal  Level of Consciousness: sedated  Airway & Oxygen Therapy: Patient Spontanous Breathing and Patient connected to face mask oxygen  Post-op Assessment: Report given to RN and Post -op Vital signs reviewed and stable  Post vital signs: Reviewed and stable  Last Vitals:  Vitals Value Taken Time  BP 83/49 11/29/21 1210  Temp    Pulse 46 11/29/21 1213  Resp 13 11/29/21 1213  SpO2 99 % 11/29/21 1213  Vitals shown include unvalidated device data.  Last Pain:  Vitals:   11/29/21 0907  TempSrc: Oral  PainSc:       Patients Stated Pain Goal: 3 (06/04/56 5051)  Complications: No notable events documented.

## 2021-11-29 NOTE — Discharge Instructions (Signed)
Gaynelle Arabian, MD Total Joint Specialist [facility] [place] [address] [place], [place] [address] [phone]  TOTAL KNEE REPLACEMENT POSTOPERATIVE DIRECTIONS    Knee Rehabilitation, Guidelines Following Surgery  Results after knee surgery are often greatly improved when you follow the exercise, range of motion and muscle strengthening exercises prescribed by your doctor. Safety measures are also important to protect the knee from further injury. If any of these exercises cause you to have increased pain or swelling in your knee joint, decrease the amount until you are comfortable again and slowly increase them. If you have problems or questions, call your caregiver or physical therapist for advice.   BLOOD CLOT PREVENTION Take a 325 mg Aspirin two times a day for three weeks following surgery. Then resume one 81 mg Aspirin once a day. You may resume your vitamins/supplements upon discharge from the hospital. Do not take any NSAIDs (Advil, Aleve, Ibuprofen, Meloxicam, etc.) until you have discontinued the 325 mg Aspirin.  HOME CARE INSTRUCTIONS  Remove items at home which could result in a fall. This includes throw rugs or furniture in walking pathways.  ICE to the affected knee as much as tolerated. Icing helps control swelling. If the swelling is well controlled you will be more comfortable and rehab easier. Continue to use ice on the knee for pain and swelling from surgery. You may notice swelling that will progress down to the foot and ankle. This is normal after surgery. Elevate the leg when you are not up walking on it.    Continue to use the breathing machine which will help keep your temperature down. It is common for your temperature to cycle up and down following surgery, especially at night when you are not up moving around and exerting yourself. The breathing machine keeps your lungs expanded and your temperature down. Do not place pillow under the  operative knee, focus on keeping the knee straight while resting  DIET You may resume your previous home diet once you are discharged from the hospital.  DRESSING / WOUND CARE / SHOWERING Keep your bulky bandage on for 2 days. On the third post-operative day you may remove the Ace bandage and gauze. There is a waterproof adhesive bandage on your skin which will stay in place until your first follow-up appointment. Once you remove this you will not need to place another bandage You may begin showering 3 days following surgery, but do not submerge the incision under water.  ACTIVITY For the first 5 days, the key is rest and control of pain and swelling Do your home exercises twice a day starting on post-operative day 3. On the days you go to physical therapy, just do the home exercises once that day. You should rest, ice and elevate the leg for 50 minutes out of every hour. Get up and walk/stretch for 10 minutes per hour. After 5 days you can increase your activity slowly as tolerated. Walk with your walker as instructed. Use the walker until you are comfortable transitioning to a cane. Walk with the cane in the opposite hand of the operative leg. You may discontinue the cane once you are comfortable and walking steadily. Avoid periods of inactivity such as sitting longer than an hour when not asleep. This helps prevent blood clots.  You may discontinue the knee immobilizer once you are able to perform a straight leg raise while lying down. You may resume a sexual relationship in one month or when given the OK by  your doctor.  You may return to work once you are cleared by your doctor.  Do not drive a car for 6 weeks or until released by your surgeon.  Do not drive while taking narcotics.  TED HOSE STOCKINGS Wear the elastic stockings on both legs for three weeks following surgery during the day. You may remove them at night for sleeping.  WEIGHT BEARING Weight bearing as tolerated with assist  device (walker, cane, etc) as directed, use it as long as suggested by your surgeon or therapist, typically at least 4-6 weeks.  POSTOPERATIVE CONSTIPATION PROTOCOL Constipation - defined medically as fewer than three stools per week and severe constipation as less than one stool per week.  One of the most common issues patients have following surgery is constipation.  Even if you have a regular bowel pattern at home, your normal regimen is likely to be disrupted due to multiple reasons following surgery.  Combination of anesthesia, postoperative narcotics, change in appetite and fluid intake all can affect your bowels.  In order to avoid complications following surgery, here are some recommendations in order to help you during your recovery period.  Colace (docusate) - Pick up an over-the-counter form of Colace or another stool softener and take twice a day as long as you are requiring postoperative pain medications.  Take with a full glass of water daily.  If you experience loose stools or diarrhea, hold the colace until you stool forms back up. If your symptoms do not get better within 1 week or if they get worse, check with your doctor. Dulcolax (bisacodyl) - Pick up over-the-counter and take as directed by the product packaging as needed to assist with the movement of your bowels.  Take with a full glass of water.  Use this product as needed if not relieved by Colace only.  MiraLax (polyethylene glycol) - Pick up over-the-counter to have on hand. MiraLax is a solution that will increase the amount of water in your bowels to assist with bowel movements.  Take as directed and can mix with a glass of water, juice, soda, coffee, or tea. Take if you go more than two days without a movement. Do not use MiraLax more than once per day. Call your doctor if you are still constipated or irregular after using this medication for 7 days in a row.  If you continue to have problems with postoperative constipation,  please contact the office for further assistance and recommendations.  If you experience "the worst abdominal pain ever" or develop nausea or vomiting, please contact the office immediatly for further recommendations for treatment.  ITCHING If you experience itching with your medications, try taking only a single pain pill, or even half a pain pill at a time.  You can also use Benadryl over the counter for itching or also to help with sleep.   MEDICATIONS See your medication summary on the After Visit Summary that the nursing staff will review with you prior to discharge.  You may have some home medications which will be placed on hold until you complete the course of blood thinner medication.  It is important for you to complete the blood thinner medication as prescribed by your surgeon.  Continue your approved medications as instructed at time of discharge.  PRECAUTIONS If you experience chest pain or shortness of breath - call 911 immediately for transfer to the hospital emergency department.  If you develop a fever greater that 101 F, purulent drainage from wound, increased redness  or drainage from wound, foul odor from the wound/dressing, or calf pain - CONTACT YOUR SURGEON.                                                   FOLLOW-UP APPOINTMENTS Make sure you keep all of your appointments after your operation with your surgeon and caregivers. You should call the office at the above phone number and make an appointment for approximately two weeks after the date of your surgery or on the date instructed by your surgeon outlined in the "After Visit Summary".  RANGE OF MOTION AND STRENGTHENING EXERCISES  Rehabilitation of the knee is important following a knee injury or an operation. After just a few days of immobilization, the muscles of the thigh which control the knee become weakened and shrink (atrophy). Knee exercises are designed to build up the tone and strength of the thigh muscles and to  improve knee motion. Often times heat used for twenty to thirty minutes before working out will loosen up your tissues and help with improving the range of motion but do not use heat for the first two weeks following surgery. These exercises can be done on a training (exercise) mat, on the floor, on a table or on a bed. Use what ever works the best and is most comfortable for you Knee exercises include:  Leg Lifts - While your knee is still immobilized in a splint or cast, you can do straight leg raises. Lift the leg to 60 degrees, hold for 3 sec, and slowly lower the leg. Repeat 10-20 times 2-3 times daily. Perform this exercise against resistance later as your knee gets better.  Quad and Hamstring Sets - Tighten up the muscle on the front of the thigh (Quad) and hold for 5-10 sec. Repeat this 10-20 times hourly. Hamstring sets are done by pushing the foot backward against an object and holding for 5-10 sec. Repeat as with quad sets.  Leg Slides: Lying on your back, slowly slide your foot toward your buttocks, bending your knee up off the floor (only go as far as is comfortable). Then slowly slide your foot back down until your leg is flat on the floor again. Angel Wings: Lying on your back spread your legs to the side as far apart as you can without causing discomfort.  A rehabilitation program following serious knee injuries can speed recovery and prevent re-injury in the future due to weakened muscles. Contact your doctor or a physical therapist for more information on knee rehabilitation.   POST-OPERATIVE OPIOID TAPER INSTRUCTIONS: It is important to wean off of your opioid medication as soon as possible. If you do not need pain medication after your surgery it is ok to stop day one. Opioids include: Codeine, Hydrocodone(Norco, Vicodin), Oxycodone(Percocet, oxycontin) and hydromorphone amongst others.  Long term and even short term use of opiods can cause: Increased pain  response Dependence Constipation Depression Respiratory depression And more.  Withdrawal symptoms can include Flu like symptoms Nausea, vomiting And more Techniques to manage these symptoms Hydrate well Eat regular healthy meals Stay active Use relaxation techniques(deep breathing, meditating, yoga) Do Not substitute Alcohol to help with tapering If you have been on opioids for less than two weeks and do not have pain than it is ok to stop all together.  Plan to wean off of opioids This  plan should start within one week post op of your joint replacement. Maintain the same interval or time between taking each dose and first decrease the dose.  Cut the total daily intake of opioids by one tablet each day Next start to increase the time between doses. The last dose that should be eliminated is the evening dose.   IF YOU ARE TRANSFERRED TO A SKILLED REHAB FACILITY If the patient is transferred to a skilled rehab facility following release from the hospital, a list of the current medications will be sent to the facility for the patient to continue.  When discharged from the skilled rehab facility, please have the facility set up the patient's [place] prior to being released. Also, the skilled facility will be responsible for providing the patient with their medications at time of release from the facility to include their pain medication, the muscle relaxants, and their blood thinner medication. If the patient is still at the rehab facility at time of the two week follow up appointment, the skilled rehab facility will also need to assist the patient in arranging follow up appointment in our office and any transportation needs.  MAKE SURE YOU:  Understand these instructions.  Get help right away if you are not doing well or get worse.   DENTAL ANTIBIOTICS:  In most cases prophylactic antibiotics for Dental procdeures after total joint surgery are not  necessary.  Exceptions are as follows:  1. History of prior total joint infection  2. Severely immunocompromised (Organ Transplant, cancer chemotherapy, Rheumatoid biologic meds such as [place])  3. Poorly controlled diabetes (A1C &gt; 8.0, blood glucose over 200)  If you have one of these conditions, contact your surgeon for an antibiotic prescription, prior to your dental procedure.    Pick up stool softner and laxative for home use following surgery while on pain medications. Do not submerge incision under water. Please use good hand washing techniques while changing dressing each day. May shower starting three days after surgery. Please use a clean towel to pat the incision dry following showers. Continue to use ice for pain and swelling after surgery. Do not use any lotions or creams on the incision until instructed by your surgeon.

## 2021-11-30 DIAGNOSIS — E119 Type 2 diabetes mellitus without complications: Secondary | ICD-10-CM | POA: Diagnosis not present

## 2021-11-30 DIAGNOSIS — M1711 Unilateral primary osteoarthritis, right knee: Secondary | ICD-10-CM | POA: Diagnosis not present

## 2021-11-30 DIAGNOSIS — I10 Essential (primary) hypertension: Secondary | ICD-10-CM | POA: Diagnosis not present

## 2021-11-30 DIAGNOSIS — Z7982 Long term (current) use of aspirin: Secondary | ICD-10-CM | POA: Diagnosis not present

## 2021-11-30 DIAGNOSIS — Z79899 Other long term (current) drug therapy: Secondary | ICD-10-CM | POA: Diagnosis not present

## 2021-11-30 DIAGNOSIS — Z96652 Presence of left artificial knee joint: Secondary | ICD-10-CM | POA: Diagnosis not present

## 2021-11-30 DIAGNOSIS — Z87891 Personal history of nicotine dependence: Secondary | ICD-10-CM | POA: Diagnosis not present

## 2021-11-30 DIAGNOSIS — I2510 Atherosclerotic heart disease of native coronary artery without angina pectoris: Secondary | ICD-10-CM | POA: Diagnosis not present

## 2021-11-30 DIAGNOSIS — Z7984 Long term (current) use of oral hypoglycemic drugs: Secondary | ICD-10-CM | POA: Diagnosis not present

## 2021-11-30 LAB — CBC
HCT: 33.5 % — ABNORMAL LOW (ref 39.0–52.0)
Hemoglobin: 11.2 g/dL — ABNORMAL LOW (ref 13.0–17.0)
MCH: 30.5 pg (ref 26.0–34.0)
MCHC: 33.4 g/dL (ref 30.0–36.0)
MCV: 91.3 fL (ref 80.0–100.0)
Platelets: 153 10*3/uL (ref 150–400)
RBC: 3.67 MIL/uL — ABNORMAL LOW (ref 4.22–5.81)
RDW: 14.6 % (ref 11.5–15.5)
WBC: 11.5 10*3/uL — ABNORMAL HIGH (ref 4.0–10.5)
nRBC: 0 % (ref 0.0–0.2)

## 2021-11-30 LAB — BASIC METABOLIC PANEL
Anion gap: 7 (ref 5–15)
BUN: 27 mg/dL — ABNORMAL HIGH (ref 8–23)
CO2: 27 mmol/L (ref 22–32)
Calcium: 8.2 mg/dL — ABNORMAL LOW (ref 8.9–10.3)
Chloride: 99 mmol/L (ref 98–111)
Creatinine, Ser: 1.13 mg/dL (ref 0.61–1.24)
GFR, Estimated: >60 mL/min (ref >60)
Glucose, Bld: 166 mg/dL — ABNORMAL HIGH (ref 70–99)
Potassium: 3.3 mmol/L — ABNORMAL LOW (ref 3.5–5.1)
Sodium: 133 mmol/L — ABNORMAL LOW (ref 135–145)

## 2021-11-30 LAB — GLUCOSE, CAPILLARY: Glucose-Capillary: 141 mg/dL — ABNORMAL HIGH (ref 70–99)

## 2021-11-30 MED ORDER — TRAMADOL HCL 50 MG PO TABS: 50.0000 mg | ORAL_TABLET | Freq: Four times a day (QID) | ORAL | 0 refills | Status: DC | PRN

## 2021-11-30 MED ORDER — METHOCARBAMOL 500 MG PO TABS: 500.0000 mg | ORAL_TABLET | Freq: Four times a day (QID) | ORAL | 0 refills | Status: AC | PRN

## 2021-11-30 MED ORDER — POTASSIUM CHLORIDE CRYS ER 20 MEQ PO TBCR
40.0000 meq | EXTENDED_RELEASE_TABLET | Freq: Once | ORAL | Status: AC
  Administered 2021-11-30: 40 meq via ORAL
  Filled 2021-11-30: qty 2

## 2021-11-30 MED ORDER — ASPIRIN 325 MG PO TBEC: 325.0000 mg | DELAYED_RELEASE_TABLET | Freq: Two times a day (BID) | ORAL | 0 refills | Status: AC

## 2021-11-30 MED ORDER — GABAPENTIN 300 MG PO CAPS: ORAL_CAPSULE | ORAL | 0 refills | Status: DC

## 2021-11-30 MED ORDER — OXYCODONE HCL 5 MG PO TABS
5.0000 mg | ORAL_TABLET | Freq: Four times a day (QID) | ORAL | 0 refills | Status: AC | PRN
Start: 2021-11-30 — End: ?

## 2021-11-30 NOTE — Progress Notes (Signed)
Provided discharge education/instructions, all questions and concerns addressed, Pt not in acute distress, discharged home with belongings accompanied by wife.

## 2021-11-30 NOTE — Progress Notes (Signed)
° °  Subjective: 1 Day Post-Op Procedure(s) (LRB): TOTAL KNEE ARTHROPLASTY (Right) Patient reports pain as mild.   Patient seen in rounds by Dr. Wynelle Link. Patient is well, and has had no acute complaints or problems No issues overnight. Denies chest pain, SOB, or calf pain. Foley catheter removed this AM.  We will continue therapy today, ambulated 49' yesterday.   Objective: Vital signs in last 24 hours: Temp:  [97.5 F (36.4 C)-98.4 F (36.9 C)] 97.7 F (36.5 C) (01/17 0542) Pulse Rate:  [43-59] 52 (01/17 0542) Resp:  [12-23] 18 (01/17 0542) BP: (83-150)/(45-77) 113/66 (01/17 0542) SpO2:  [92 %-100 %] 92 % (01/17 0542) Weight:  [113.5 kg] 113.5 kg (01/16 1335)  Intake/Output from previous day:  Intake/Output Summary (Last 24 hours) at 11/30/2021 0753 Last data filed at 11/30/2021 0615 Gross per 24 hour  Intake 3187.05 ml  Output 1600 ml  Net 1587.05 ml     Intake/Output this shift: No intake/output data recorded.  Labs: Recent Labs    11/30/21 0313  HGB 11.2*   Recent Labs    11/30/21 0313  WBC 11.5*  RBC 3.67*  HCT 33.5*  PLT 153   Recent Labs    11/30/21 0313  NA 133*  K 3.3*  CL 99  CO2 27  BUN 27*  CREATININE 1.13  GLUCOSE 166*  CALCIUM 8.2*   No results for input(s): LABPT, INR in the last 72 hours.  Exam: General - Patient is Alert and Oriented Extremity - Neurologically intact Neurovascular intact Sensation intact distally Dorsiflexion/Plantar flexion intact Dressing - dressing C/D/I Motor Function - intact, moving foot and toes well on exam.   Past Medical History:  Diagnosis Date   Arthritis    CAD (coronary artery disease)    Diabetes ([place])    ED (erectile dysfunction)    Gout    Heart murmur    Hyperlipidemia    Hypertension    Obesity    Vertigo     Assessment/Plan: 1 Day Post-Op Procedure(s) (LRB): TOTAL KNEE ARTHROPLASTY (Right) Principal Problem:   OA (osteoarthritis) of knee Active Problems:   Primary  osteoarthritis of right knee  Estimated body mass index is 35.9 kg/m as calculated from the following:   Height as of this encounter: 5\' 10"  (1.778 m).   Weight as of this encounter: 113.5 kg. Advance diet Up with therapy D/C IV fluids   Patient's anticipated LOS is less than 2 midnights, meeting these requirements: - Lives within 1 hour of care - Has a competent adult at home to recover with post-op recover - NO history of  - Chronic pain requiring opioids  - Heart failure  - Heart attack  - Stroke  - DVT/VTE  - Cardiac arrhythmia  - Respiratory Failure/COPD  - Renal failure  - Anemia  - Advanced Liver disease   DVT Prophylaxis - Aspirin Weight bearing as tolerated. Continue therapy.  Potassium 3.3 this AM, one dose of 40 mEq Kcl ordered.  Plan is to go Home after hospital stay. Plan for discharge later today after one session of therapy if meeting goals. Scheduled for OPP[facility] at [facility]). Follow-up in the office in 2 weeks.  The PDMP database was reviewed today prior to any opioid medications being prescribed to this patient.  Theresa Duty, PA-C Orthopedic Surgery [phone] 11/30/2021, 7:53 AM

## 2021-11-30 NOTE — Progress Notes (Signed)
Physical Therapy Treatment Patient Details Name: John Wilson MRN: [id number] DOB: Oct 06, 1946 Today's Date: 11/30/2021   History of Present Illness 76 YO male, S/P RTKA on 11/29/2021, S/P LTKA, vertigo, DM, CAD    PT Comments    POD # 1 am session General Comments: AxO x 3 very knowledgable from prior TKR April 2022. General transfer comment: one VC to avoid pulling up on walker and one VC safety with turn completion. General Gait Details: tolerated an increased distance with alternating gait. General stair comments: one initial VC on proper sequencing and safety.  Then returned to room to perform some TE's following HEP handout.  Instructed on proper tech, freq as well as use of ICE.   Addressed all mobility questions, discussed appropriate activity, educated on use of ICE.  Pt ready for D/C to home.    Recommendations for follow up therapy are one component of a multi-disciplinary discharge planning process, led by the attending physician.  Recommendations may be updated based on patient status, additional functional criteria and insurance authorization.  Follow Up Recommendations  Outpatient PT     Assistance Recommended at Discharge Intermittent Supervision/Assistance  Patient can return home with the following Help with stairs or ramp for entrance;Assistance with cooking/housework   Equipment Recommendations  None recommended by PT    Recommendations for Other Services       Precautions / Restrictions Precautions Precautions: Knee;Fall Precaution Comments: instructed no pillow under knee Restrictions Weight Bearing Restrictions: No RLE Weight Bearing: Weight bearing as tolerated     Mobility  Bed Mobility               General bed mobility comments: OOB in recliner    Transfers Overall transfer level: Needs assistance Equipment used: Rolling walker (2 wheels) Transfers: Sit to/from Stand Sit to Stand: Supervision           General transfer comment:  one VC to avoid pulling up on walker and one VC safety with turn completion    Ambulation/Gait Ambulation/Gait assistance: Supervision Gait Distance (Feet): 145 Feet Assistive device: Rolling walker (2 wheels) Gait Pattern/deviations: Step-through pattern Gait velocity: WFL     General Gait Details: tolerated an increased distance with alternating gait.   Stairs Stairs: Yes Stairs assistance: Supervision, Min guard Stair Management: Two rails, Forwards, Step to pattern Number of Stairs: 2 General stair comments: one initial VC on proper sequencing and safety   Wheelchair Mobility    Modified Rankin (Stroke Patients Only)       Balance                                            Cognition Arousal/Alertness: Awake/alert Behavior During Therapy: WFL for tasks assessed/performed Overall Cognitive Status: Within Functional Limits for tasks assessed                                 General Comments: AxO x 3 very knowledgable from prior TKR April 2022        Exercises  Total Knee Replacement TE's following HEP handout 10 reps B LE ankle pumps 05 reps towel squeezes 05 reps knee presses 05 reps heel slides  05 reps SAQ's 05 reps SLR's 05 reps ABD Educated on use of gait belt to assist with TE's Followed by ICE     General  Comments        Pertinent Vitals/Pain Pain Assessment Pain Score: 4  Pain Location: right knee Pain Descriptors / Indicators: Discomfort, Operative site guarding, Tightness Pain Intervention(s): Monitored during session, Premedicated before session, Repositioned, Ice applied    Home Living                          Prior Function            PT Goals (current goals can now be found in the care plan section) Progress towards PT goals: Progressing toward goals    Frequency    7X/week      PT Plan Current plan remains appropriate    Co-evaluation              AM-PAC PT "6  Clicks" Mobility   Outcome Measure  Help needed turning from your back to your side while in a flat bed without using bedrails?: None Help needed moving from lying on your back to sitting on the side of a flat bed without using bedrails?: None Help needed moving to and from a bed to a chair (including a wheelchair)?: A Little Help needed standing up from a chair using your arms (e.g., wheelchair or bedside chair)?: A Little Help needed to walk in hospital room?: A Little Help needed climbing 3-5 steps with a railing? : A Little 6 Click Score: 20    End of Session Equipment Utilized During Treatment: Gait belt Activity Tolerance: Patient tolerated treatment well Patient left: in chair;with call bell/phone within reach;with chair alarm set Nurse Communication: Mobility status (pt has met mobility goals to D/C to home afdter this one PT session) PT Visit Diagnosis: Unsteadiness on feet (R26.81);Difficulty in walking, not elsewhere classified (R26.2);Pain Pain - Right/Left: Right Pain - part of body: Knee     Time: 2334-3568 PT Time Calculation (min) (ACUTE ONLY): 26 min  Charges:  $Gait Training: 8-22 mins $Therapeutic Exercise: 8-22 mins                     {Natacha Jepsen  PTA Acute  Rehabilitation Services Pager      [phone] Office      [phone]

## 2021-11-30 NOTE — TOC Transition Note (Signed)
Transition of Care [facility]) - CM/SW Discharge Note  Patient Details  Name: John Wilson MRN: [id number] Date of Birth: [birth year]  [facility] CM/SW Contact:  Sherie Don, LCSW Phone Number: 11/30/2021, 9:49 AM  Clinical Narrative: Patient is expected to discharge home after working with PT. CSW met with patient to review discharge plan. Patient will go home with OPP[facility] at [facility] with the first appointment scheduled for 12/02/21. Patient has a rolling walker and toilet riser at home, so there are no DME needs at this time. TOC signing off.  Final next level of care: OP Rehab Barriers to Discharge: No Barriers Identified  Patient Goals and CMS Choice Patient states their goals for this hospitalization and ongoing recovery are:: Discharge home with OPPT at [facility] Choice offered to / list presented to : NA  Discharge Plan and Services     DME Arranged: N/A DME Agency: NA HH Arranged: NA HH Agency: NA  Readmission Risk Interventions No flowsheet data found.

## 2021-11-30 NOTE — Plan of Care (Signed)

## 2021-12-02 ENCOUNTER — Ambulatory Visit: Payer: Medicare HMO | Attending: Orthopedic Surgery

## 2021-12-02 ENCOUNTER — Other Ambulatory Visit: Payer: Self-pay

## 2021-12-02 DIAGNOSIS — M25661 Stiffness of right knee, not elsewhere classified: Secondary | ICD-10-CM | POA: Insufficient documentation

## 2021-12-02 DIAGNOSIS — M25561 Pain in right knee: Secondary | ICD-10-CM | POA: Insufficient documentation

## 2021-12-02 NOTE — Therapy (Signed)
[facility] [place], [place], [address] Phone: [phone]   Fax:  [phone]  Physical Therapy Evaluation  Patient Details  Name: John Wilson MRN: [id number] Date of Birth: 20-Feb-1946 Referring Provider (PT): Maureen Ralphs, MD   Encounter Date: 12/02/2021   PT End of Session - 12/02/21 0817     Visit Number 1    Number of Visits 12    PT Start Time 0825    PT Stop Time 0901    PT Time Calculation (min) 36 min    Activity Tolerance Patient tolerated treatment well    Behavior During Therapy [facility] for tasks assessed/performed             Past Medical History:  Diagnosis Date   Arthritis    CAD (coronary artery disease)    Diabetes ([place])    ED (erectile dysfunction)    Gout    Heart murmur    Hyperlipidemia    Hypertension    Obesity    Vertigo     Past Surgical History:  Procedure Laterality Date   [place]  2001   2 separate surgeries, 1 month apart   HERNIA REPAIR  [id number]   Umbilical   LEFT HEART CATH AND CORONARY ANGIOGRAPHY N/A 03/16/2020   Procedure: LEFT HEART CATH AND CORONARY ANGIOGRAPHY;  Surgeon: Troy Sine, MD;  Location: [facility];  Service: Cardiovascular;  Laterality: N/A;   PILONIDAL CYST EXCISION  1979   TOTAL KNEE ARTHROPLASTY Left 03/08/2021   Procedure: TOTAL KNEE ARTHROPLASTY;  Surgeon: Gaynelle Arabian, MD;  Location: [facility];  Service: Orthopedics;  Laterality: Left;  65min   TOTAL KNEE ARTHROPLASTY Right 11/29/2021   Procedure: TOTAL KNEE ARTHROPLASTY;  Surgeon: Gaynelle Arabian, MD;  Location: [facility];  Service: Orthopedics;  Laterality: Right;    There were no vitals filed for this visit.    Subjective Assessment - 12/02/21 0816     Subjective Patient reports that he had a right TKA on 11/29/21. He notes that it has been "terrible" as he has not had any pain medication since surgery. He notes that he is still getting  around with a walker, but it is hurting a lot.    Pertinent History diabetes, HTN, OA    Limitations Walking    Patient Stated Goals walking without an assistive device, hunt, fish,    Currently in Pain? Yes    Pain Score 8     Pain Location Knee    Pain Orientation Right    Pain Descriptors / Indicators Sharp;Numbness;Throbbing    Pain Type Surgical pain    Pain Onset In the past 7 days    Pain Frequency Constant    Aggravating Factors  movement    Pain Relieving Factors ice    Effect of Pain on Daily Activities requires increased time                [facility] PT Assessment - 12/02/21 0001       Assessment   Medical Diagnosis Right TKA    Referring Provider (PT) Maureen Ralphs, MD    Onset Date/Surgical Date 11/29/21    Next MD Visit 12/14/21    Prior Therapy Yes, acute care      Precautions   Precautions None      Restrictions   Weight Bearing Restrictions No      Balance Screen   Has the patient fallen in the past 6  months No    Has the patient had a decrease in activity level because of a fear of falling?  No    Is the patient reluctant to leave their home because of a fear of falling?  No      Home Ecologist residence    Home Access Stairs to enter    Entrance Stairs-Number of Steps 2    [place] One level    [place] - 2 wheels      Prior Function   Level of [place] Retired    Leisure hunt, fish from [place]   Overall Cognitive Status Within Functional Limits for tasks assessed    Attention Focused    Focused Attention Appears intact    Memory Appears intact    Awareness Appears intact    Problem Solving Appears intact      Observation/Other Assessments   Observations Right knee incision surrounding area was unable to be observed due to clothing restrictions      Sensation   Additional Comments Patient reports no numbness or tingling      ROM / Strength   AROM / PROM  / Strength AROM;PROM      AROM   AROM Assessment Site Knee    Right/Left Knee Right;Left    Right Knee Extension 13    Right Knee Flexion 93    Left Knee Extension 0    Left Knee Flexion 121      PROM   PROM Assessment Site Knee    Right/Left Knee Right    Right Knee Flexion 93   limited by pain     Palpation   Palpation comment TTP: right IT band and quadriceps      Ambulation/Gait   Ambulation/Gait Yes    Ambulation/Gait Assistance 6: Modified independent (Device/Increase time)    Assistive device Rolling walker    Gait Pattern Step-to pattern;Decreased stride length;Decreased hip/knee flexion - right;Right foot flat;Left foot flat    Ambulation Surface Level;Indoor                        Objective measurements completed on examination: See above findings.       [facility] Adult PT Treatment/Exercise - 12/02/21 0001       Modalities   Modalities Vasopneumatic      Vasopneumatic   Number Minutes Vasopneumatic  15 minutes    Vasopnuematic Location  Knee    Vasopneumatic Pressure Low    Vasopneumatic Temperature  34                          PT Long Term Goals - 12/02/21 5621       PT LONG TERM GOAL #1   Title Patient will be independent with his HEP.    Time 4    Period Weeks    Status New    Target Date 12/30/21      PT LONG TERM GOAL #2   Title Patient will be able to demonstrrate at least 120 degrees of active right knee flexion.    Time 4    Period Weeks    Status New    Target Date 12/30/21      PT LONG TERM GOAL #3   Title Patient will be able to demonstrate active right knee extension with 5 degrees of neutral.  Time 4    Period Weeks    Status New    Target Date 12/30/21      PT LONG TERM GOAL #4   Title Patient will be able to safely ambulate at least 80 feet without an assistive device for improved household mobility.    Time 4    Period Weeks    Status New    Target Date 12/30/21                     Plan - 12/02/21 0820     Clinical Impression Statement Patient is a 76 year old male presenting to physical therapy following a right TKA on 11/29/21. He presented to treatment with high pain severity and irritability. He was able to demonstrate good right knee AROM with his extension and flexion being 13 and 93 degrees, respectively. Recommend that he continue with skilled physical therapy to address his remaining impairments to return to his prior level of function.    Personal Factors and Comorbidities Comorbidity 1;Comorbidity 2;Comorbidity 3+    Comorbidities diabetes, HTN, OA    Examination-Activity Limitations Locomotion Level;Transfers;Stairs;Stand;Squat;Carry;Sleep    Examination-Participation Restrictions Yard Work    Stability/Clinical Decision Making Evolving/Moderate complexity    Clinical Decision Making Moderate    Rehab Potential Excellent    PT Frequency 3x / week    PT Duration 4 weeks    PT Treatment/Interventions ADLs/Self Care Home Management;Cryotherapy;Electrical Stimulation;Moist Heat;Gait training;Stair training;Functional mobility training;Therapeutic activities;Therapeutic exercise;Balance training;Neuromuscular re-education;Manual techniques;Patient/family education;Passive range of motion;Energy conservation;Taping;Vasopneumatic Device    PT Next Visit Plan FOTO; nustep, PROM, AAROM, and AROM interventions for improved knee mobility    Consulted and Agree with Plan of Care Patient             Patient will benefit from skilled therapeutic intervention in order to improve the following deficits and impairments:  Abnormal gait, Decreased range of motion, Difficulty walking, Decreased activity tolerance, Pain, Decreased balance, Decreased mobility, Decreased strength, Increased edema  Visit Diagnosis: Acute pain of right knee  Stiffness of right knee, not elsewhere classified     Problem List Patient Active Problem List   Diagnosis Date  Noted   Primary osteoarthritis of right knee 11/29/2021   Hypertension 05/03/2021   OA (osteoarthritis) of knee 03/08/2021   Primary osteoarthritis of left knee 03/08/2021   Abdominal aortic ectasia ([place]) 12/31/2020   Constipation 12/31/2020   Idiopathic gout 12/31/2020   Mixed hyperlipidemia 12/31/2020   Morbid obesity ([place]) 12/31/2020   Personal history of colonic polyps 12/31/2020   Polyneuropathy in diseases classified elsewhere ([place]) 12/31/2020   Type 2 diabetes mellitus with other circulatory complications ([place]) 88/09/314   Diabetes mellitus with coincident hypertension ([place]) 09/16/2019   Coronary artery disease involving native coronary artery of native heart without angina pectoris 09/16/2019   Pure hypercholesterolemia 09/16/2019   Incarcerated umbilical hernia 94/58/5929    Darlin Coco, PT 12/02/2021, 12:40 PM  [facility] [address] [place], [place], [address] Phone: [phone]   Fax:  [phone]  Name: John Wilson MRN: [id number] Date of Birth: 1946-05-03

## 2021-12-06 ENCOUNTER — Encounter: Payer: Self-pay | Admitting: Physical Therapy

## 2021-12-06 ENCOUNTER — Other Ambulatory Visit: Payer: Self-pay

## 2021-12-06 ENCOUNTER — Ambulatory Visit: Payer: Medicare HMO | Admitting: Physical Therapy

## 2021-12-06 DIAGNOSIS — M25561 Pain in right knee: Secondary | ICD-10-CM

## 2021-12-06 DIAGNOSIS — M25661 Stiffness of right knee, not elsewhere classified: Secondary | ICD-10-CM

## 2021-12-06 NOTE — Therapy (Signed)
[facility] [place], [place], [address] Phone: [phone]   Fax:  [phone]  Physical Therapy Treatment  Patient Details  Name: John Wilson MRN: [id number] Date of Birth: 11/11/1946 Referring Provider (PT): Maureen Ralphs, MD   Encounter Date: 12/06/2021   PT End of Session - 12/06/21 0832     Visit Number 2    Number of Visits 12    Date for PT Re-Evaluation 12/31/21    PT Start Time 0817    PT Stop Time 1779    PT Time Calculation (min) 40 min    Equipment Utilized During Treatment Other (comment)   FWW   Activity Tolerance Patient tolerated treatment well    Behavior During Therapy [facility] for tasks assessed/performed             Past Medical History:  Diagnosis Date   Arthritis    CAD (coronary artery disease)    Diabetes ([place])    ED (erectile dysfunction)    Gout    Heart murmur    Hyperlipidemia    Hypertension    Obesity    Vertigo     Past Surgical History:  Procedure Laterality Date   [place]  2001   2 separate surgeries, 1 month apart   HERNIA REPAIR  [id number]   Umbilical   LEFT HEART CATH AND CORONARY ANGIOGRAPHY N/A 03/16/2020   Procedure: LEFT HEART CATH AND CORONARY ANGIOGRAPHY;  Surgeon: Troy Sine, MD;  Location: [facility];  Service: Cardiovascular;  Laterality: N/A;   PILONIDAL CYST EXCISION  1979   TOTAL KNEE ARTHROPLASTY Left 03/08/2021   Procedure: TOTAL KNEE ARTHROPLASTY;  Surgeon: Gaynelle Arabian, MD;  [facility]: [facility];  Service: Orthopedics;  Laterality: Left;  72min   TOTAL KNEE ARTHROPLASTY Right 11/29/2021   Procedure: TOTAL KNEE ARTHROPLASTY;  Surgeon: Gaynelle Arabian, MD;  [facility];  Service: Orthopedics;  Laterality: Right;    There were no vitals filed for this visit.   Subjective Assessment - 12/06/21 0820     Subjective Reports difficulty walking since doing HEP from surgical center.    Pertinent  History diabetes, HTN, OA    Limitations Walking    Patient Stated Goals walking without an assistive device, hunt, fish,    Currently in Pain? Yes    Pain Score 8     Pain Location Knee    Pain Orientation Right    Pain Descriptors / Indicators Discomfort    Pain Type Surgical pain    Pain Onset 1 to 4 weeks ago    Pain Frequency Constant                [facility] PT Assessment - 12/06/21 0001       Assessment   Medical Diagnosis Right TKA    Referring Provider (PT) Maureen Ralphs, MD    Onset Date/Surgical Date 11/29/21    Next MD Visit 12/14/21    Prior Therapy Yes, acute care      Precautions   Precautions None      Restrictions   Weight Bearing Restrictions No      Observation/Other Assessments   Focus on Therapeutic Outcomes (FOTO)  51% limitation 2nd visit                           [facility] Adult PT Treatment/Exercise - 12/06/21 0001       Exercises  Exercises Knee/Hip      Knee/Hip Exercises: Aerobic   Nustep L3, seat 10- 9 x15 min      Knee/Hip Exercises: Standing   Forward Lunges Right;20 reps;2 seconds    Rocker Board 3 minutes      Knee/Hip Exercises: Supine   Short Arc Quad Sets AROM;Right;20 reps      Modalities   Modalities Vasopneumatic      Vasopneumatic   Number Minutes Vasopneumatic  10 minutes    Vasopnuematic Location  Knee    Vasopneumatic Pressure Medium    Vasopneumatic Temperature  34                          PT Long Term Goals - 12/02/21 7793       PT LONG TERM GOAL #1   Title Patient will be independent with his HEP.    Time 4    Period Weeks    Status New    Target Date 12/30/21      PT LONG TERM GOAL #2   Title Patient will be able to demonstrrate at least 120 degrees of active right knee flexion.    Time 4    Period Weeks    Status New    Target Date 12/30/21      PT LONG TERM GOAL #3   Title Patient will be able to demonstrate active right knee extension with 5 degrees of neutral.     Time 4    Period Weeks    Status New    Target Date 12/30/21      PT LONG TERM GOAL #4   Title Patient will be able to safely ambulate at least 80 feet without an assistive device for improved household mobility.    Time 4    Period Weeks    Status New    Target Date 12/30/21                   Plan - 12/06/21 9030     Clinical Impression Statement Patient presented in clinic with reports of limitations from exercise from over the weekend due to pain. Patient able to tolerate light exercises today for ROM and quad activation. High pain severity reported although patient has pain meds now. More pain reported by patient when completing SAQ. Normal vasopnuematic response noted following removal of the modality.    Personal Factors and Comorbidities Comorbidity 1;Comorbidity 2;Comorbidity 3+    Comorbidities diabetes, HTN, OA    Examination-Activity Limitations Locomotion Level;Transfers;Stairs;Stand;Squat;Carry;Sleep    Examination-Participation Restrictions Yard Work    Stability/Clinical Decision Making Evolving/Moderate complexity    Rehab Potential Excellent    PT Frequency 3x / week    PT Duration 4 weeks    PT Treatment/Interventions ADLs/Self Care Home Management;Cryotherapy;Electrical Stimulation;Moist Heat;Gait training;Stair training;Functional mobility training;Therapeutic activities;Therapeutic exercise;Balance training;Neuromuscular re-education;Manual techniques;Patient/family education;Passive range of motion;Energy conservation;Taping;Vasopneumatic Device    PT Next Visit Plan nustep, PROM, AAROM, and AROM interventions for improved knee mobility    Consulted and Agree with Plan of Care Patient             Patient will benefit from skilled therapeutic intervention in order to improve the following deficits and impairments:  Abnormal gait, Decreased range of motion, Difficulty walking, Decreased activity tolerance, Pain, Decreased balance, Decreased mobility,  Decreased strength, Increased edema  Visit Diagnosis: Acute pain of right knee  Stiffness of right knee, not elsewhere classified     Problem List Patient  Active Problem List   Diagnosis Date Noted   Primary osteoarthritis of right knee 11/29/2021   Hypertension 05/03/2021   OA (osteoarthritis) of knee 03/08/2021   Primary osteoarthritis of left knee 03/08/2021   Abdominal aortic ectasia ([facility]) 12/31/2020   Constipation 12/31/2020   Idiopathic gout 12/31/2020   Mixed hyperlipidemia 12/31/2020   Morbid obesity ([place]) 12/31/2020   Personal history of colonic polyps 12/31/2020   Polyneuropathy in diseases classified elsewhere ([place]) 12/31/2020   Type 2 diabetes mellitus with other circulatory complications ([place]) 20/60/1561   Diabetes mellitus with coincident hypertension ([place]) 09/16/2019   Coronary artery disease involving native coronary artery of native heart without angina pectoris 09/16/2019   Pure hypercholesterolemia 09/16/2019   Incarcerated umbilical hernia 53/79/4327    Standley Brooking, PTA 12/06/2021, 9:06 AM  [facility] [place], [place], [address] Phone: [phone]   Fax:  [phone]  Name: John Wilson MRN: [id number] Date of Birth: [birth year]

## 2021-12-06 NOTE — Discharge Summary (Signed)
Patient ID: John Wilson MRN: [id number] DOB/AGE: 05-31-1946 76 y.o.  Admit date: 11/29/2021 Discharge date: 11/30/2021  Admission Diagnoses:  Principal Problem:   OA (osteoarthritis) of knee Active Problems:   Primary osteoarthritis of right knee   Discharge Diagnoses:  Same  Past Medical History:  Diagnosis Date   Arthritis    CAD (coronary artery disease)    Diabetes ([place])    ED (erectile dysfunction)    Gout    Heart murmur    Hyperlipidemia    Hypertension    Obesity    Vertigo     Surgeries: Procedure(s): TOTAL KNEE ARTHROPLASTY on 11/29/2021   Consultants:   Discharged Condition: Improved  Hospital Course: John Wilson is an 76 y.o. male who was admitted 11/29/2021 for operative treatment ofOA (osteoarthritis) of knee. Patient has severe unremitting pain that affects sleep, daily activities, and work/hobbies. After pre-op clearance the patient was taken to the operating room on 11/29/2021 and underwent  Procedure(s): TOTAL KNEE ARTHROPLASTY.    Patient was given perioperative antibiotics:  Anti-infectives (From admission, onward)    Start     Dose/Rate Route Frequency Ordered Stop   11/29/21 1700  ceFAZolin (ANCEF) IVPB 2g/100 mL premix        2 g 200 mL/hr over 30 Minutes Intravenous Every 6 hours 11/29/21 1344 11/29/21 2248   11/29/21 0830  ceFAZolin (ANCEF) IVPB 2g/100 mL premix        2 g 200 mL/hr over 30 Minutes Intravenous On call to O.R. 11/29/21 0829 11/29/21 1038        Patient was given sequential compression devices, early ambulation, and chemoprophylaxis to prevent DVT.  Patient benefited maximally from hospital stay and there were no complications.    Recent vital signs: No data found.   Recent laboratory studies: No results for input(s): WBC, HGB, HCT, PLT, NA, K, CL, CO2, BUN, CREATININE, GLUCOSE, INR, CALCIUM in the last 72 hours.  Invalid input(s): PT, 2   Discharge Medications:   Allergies as of 11/30/2021       Reactions    Sulfa Antibiotics Hives        Medication List     STOP taking these medications    indomethacin 50 MG capsule Commonly known as: INDOCIN   potassium chloride SA 20 MEQ tablet Commonly known as: KLOR-CON M       TAKE these medications    amLODipine 5 MG tablet Commonly known as: NORVASC Take 1 tablet (5 mg total) by mouth daily.   aspirin 325 MG EC tablet Take 1 tablet (325 mg total) by mouth 2 (two) times daily for 20 days. Then resume one 81 mg aspirin once a day. What changed:  medication strength how much to take when to take this additional instructions   atorvastatin 80 MG tablet Commonly known as: LIPITOR Take 1 tablet (80 mg total) by mouth daily.   chlorthalidone 25 MG tablet Commonly known as: HYGROTON Take 1 tablet (25 mg total) by mouth daily.   gabapentin 300 MG capsule Commonly known as: NEURONTIN Take a 300 mg capsule three times a day for two weeks following surgery.Then take a 300 mg capsule two times a day for two weeks. Then take a 300 mg capsule once a day for two weeks. Then discontinue.   isosorbide mononitrate 30 MG 24 hr tablet Commonly known as: IMDUR Take 1 tablet (30 mg total) by mouth daily.   losartan 50 MG tablet Commonly known as: COZAAR Take 1 tablet (50mg ) by mouth  twice daily   metFORMIN 500 MG 24 hr tablet Commonly known as: GLUCOPHAGE-XR Take 500-1,000 mg by mouth See admin instructions. Take 500 mg in the morning and 1000 mg in the evening Notes to patient: Resume home regimen   methocarbamol 500 MG tablet Commonly known as: ROBAXIN Take 1 tablet (500 mg total) by mouth every 6 (six) hours as needed for muscle spasms. Notes to patient: Last dose given 01/17 12:52am   metoprolol succinate 50 MG 24 hr tablet Commonly known as: TOPROL-XL Take 50 mg by mouth daily. Take with or immediately following a meal.   nitroGLYCERIN 0.4 MG SL tablet Commonly known as: NITROSTAT Place 0.4 mg under the tongue every 5 (five)  minutes as needed for chest pain. Notes to patient: Resume home regimen   oxyCODONE 5 MG immediate release tablet Commonly known as: Oxy IR/ROXICODONE Take 1-2 tablets (5-10 mg total) by mouth every 6 (six) hours as needed for severe pain. Not to exceed 6 tablets a day. Notes to patient: Last dose given 01/17 12:52am   senna 8.6 MG Tabs tablet Commonly known as: SENOKOT Take 1 tablet by mouth daily as needed for mild constipation. Notes to patient: Resume home regimen   traMADol 50 MG tablet Commonly known as: ULTRAM Take 1 tablet (50 mg total) by mouth every 6 (six) hours as needed for moderate pain. Notes to patient: Last dose given 01/17 06:18am   zolpidem 12.5 MG CR tablet Commonly known as: AMBIEN CR Take 12.5 mg by mouth at bedtime as needed for sleep. Notes to patient: Resume home regimen               Discharge Care Instructions  (From admission, onward)           Start     Ordered   11/30/21 0000  Weight bearing as tolerated        11/30/21 0757   11/30/21 0000  Change dressing       Comments: You may remove the bulky bandage (ACE wrap and gauze) two days after surgery. You will have an adhesive waterproof bandage underneath. Leave this in place until your first follow-up appointment.   11/30/21 0757            Diagnostic Studies: No results found.  Disposition: Discharge disposition: 01-Home or Self Care       Discharge Instructions     Call MD / Call 911   Complete by: As directed    If you experience chest pain or shortness of breath, CALL 911 and be transported to the hospital emergency room.  If you develope a fever above 101 F, pus (white drainage) or increased drainage or redness at the wound, or calf pain, call your surgeon's office.   Change dressing   Complete by: As directed    You may remove the bulky bandage (ACE wrap and gauze) two days after surgery. You will have an adhesive waterproof bandage underneath. Leave this in place  until your first follow-up appointment.   Constipation Prevention   Complete by: As directed    Drink plenty of fluids.  Prune juice may be helpful.  You may use a stool softener, such as Colace (over the counter) 100 mg twice a day.  Use MiraLax (over the counter) for constipation as needed.   Diet - low sodium heart healthy   Complete by: As directed    Do not put a pillow under the knee. Place it under the heel.   Complete  by: As directed    Driving restrictions   Complete by: As directed    No driving for two weeks   Post-operative opioid taper instructions:   Complete by: As directed    POST-OPERATIVE OPIOID TAPER INSTRUCTIONS: It is important to wean off of your opioid medication as soon as possible. If you do not need pain medication after your surgery it is ok to stop day one. Opioids include: Codeine, Hydrocodone(Norco, Vicodin), Oxycodone(Percocet, oxycontin) and hydromorphone amongst others.  Long term and even short term use of opiods can cause: Increased pain response Dependence Constipation Depression Respiratory depression And more.  Withdrawal symptoms can include Flu like symptoms Nausea, vomiting And more Techniques to manage these symptoms Hydrate well Eat regular healthy meals Stay active Use relaxation techniques(deep breathing, meditating, yoga) Do Not substitute Alcohol to help with tapering If you have been on opioids for less than two weeks and do not have pain than it is ok to stop all together.  Plan to wean off of opioids This plan should start within one week post op of your joint replacement. Maintain the same interval or time between taking each dose and first decrease the dose.  Cut the total daily intake of opioids by one tablet each day Next start to increase the time between doses. The last dose that should be eliminated is the evening dose.      TED hose   Complete by: As directed    Use stockings (TED hose) for three weeks on both  leg(s).  You may remove them at night for sleeping.   Weight bearing as tolerated   Complete by: As directed         Follow-up Information     Gaynelle Arabian, MD. Go on 12/14/2021.   Specialty: Orthopedic Surgery Why: You are scheduled for a follow up appointment on 12-14-21 at 3:00 pm. Contact information: [address] [place] [address] [phone]                  Signed: Theresa Duty 12/06/2021, 8:08 AM

## 2021-12-08 ENCOUNTER — Encounter: Payer: Self-pay | Admitting: Physical Therapy

## 2021-12-08 ENCOUNTER — Other Ambulatory Visit: Payer: Self-pay

## 2021-12-08 ENCOUNTER — Ambulatory Visit: Payer: Medicare HMO | Admitting: Physical Therapy

## 2021-12-08 DIAGNOSIS — M25561 Pain in right knee: Secondary | ICD-10-CM

## 2021-12-08 DIAGNOSIS — M25661 Stiffness of right knee, not elsewhere classified: Secondary | ICD-10-CM | POA: Diagnosis not present

## 2021-12-08 NOTE — Therapy (Signed)
[facility] [place], [place], [address] Phone: [phone]   Fax:  [phone]  Physical Therapy Treatment  Patient Details  Name: John Wilson MRN: [id number] Date of Birth: 09-20-1946 Referring Provider (PT): Maureen Ralphs, MD   Encounter Date: 12/08/2021   PT End of Session - 12/08/21 0853     Visit Number 3    Number of Visits 12    Date for PT Re-Evaluation 12/31/21    PT Start Time 0817    PT Stop Time 0900    PT Time Calculation (min) 43 min    Equipment Utilized During Treatment Other (comment)   FWW   Activity Tolerance Patient tolerated treatment well    Behavior During Therapy [facility] for tasks assessed/performed             Past Medical History:  Diagnosis Date   Arthritis    CAD (coronary artery disease)    Diabetes ([place])    ED (erectile dysfunction)    Gout    Heart murmur    Hyperlipidemia    Hypertension    Obesity    Vertigo     Past Surgical History:  Procedure Laterality Date   [place]  2001   2 separate surgeries, 1 month apart   HERNIA REPAIR  [id number]   Umbilical   LEFT HEART CATH AND CORONARY ANGIOGRAPHY N/A 03/16/2020   Procedure: LEFT HEART CATH AND CORONARY ANGIOGRAPHY;  Surgeon: Troy Sine, MD;  Location: [facility];  Service: Cardiovascular;  Laterality: N/A;   PILONIDAL CYST EXCISION  1979   TOTAL KNEE ARTHROPLASTY Left 03/08/2021   Procedure: TOTAL KNEE ARTHROPLASTY;  Surgeon: Gaynelle Arabian, MD;  Location: [facility];  Service: Orthopedics;  Laterality: Left;  70min   TOTAL KNEE ARTHROPLASTY Right 11/29/2021   Procedure: TOTAL KNEE ARTHROPLASTY;  Surgeon: Gaynelle Arabian, MD;  Location: [facility];  Service: Orthopedics;  Laterality: Right;    There were no vitals filed for this visit.   Subjective Assessment - 12/08/21 0852     Subjective Reports difficulty and pain with knee flexion.    Pertinent History diabetes, HTN,  OA    Limitations Walking    Patient Stated Goals walking without an assistive device, hunt, fish,    Currently in Pain? Yes    Pain Score 8     Pain Location Knee    Pain Orientation Right    Pain Descriptors / Indicators Discomfort    Pain Type Surgical pain    Pain Onset 1 to 4 weeks ago    Pain Frequency Constant                [facility] PT Assessment - 12/08/21 0001       Assessment   Medical Diagnosis Right TKA    Referring Provider (PT) Maureen Ralphs, MD    Onset Date/Surgical Date 11/29/21    Next MD Visit 12/14/21    Prior Therapy Yes, acute care      Precautions   Precautions None      Restrictions   Weight Bearing Restrictions No                           [facility] Adult PT Treatment/Exercise - 12/08/21 0001       Knee/Hip Exercises: Aerobic   Nustep L3, seat 10 x15 min      Knee/Hip Exercises: Standing   Forward Lunges  Right;20 reps;2 seconds    Forward Lunges Limitations 8" step    Rocker Board 3 minutes      Knee/Hip Exercises: Supine   Short Arc Quad Sets AROM;Right;20 reps    Heel Slides AROM;Right      Modalities   Modalities Vasopneumatic      Vasopneumatic   Number Minutes Vasopneumatic  10 minutes    Vasopnuematic Location  Knee    Vasopneumatic Pressure Medium    Vasopneumatic Temperature  34                          PT Long Term Goals - 12/02/21 0354       PT LONG TERM GOAL #1   Title Patient will be independent with his HEP.    Time 4    Period Weeks    Status New    Target Date 12/30/21      PT LONG TERM GOAL #2   Title Patient will be able to demonstrrate at least 120 degrees of active right knee flexion.    Time 4    Period Weeks    Status New    Target Date 12/30/21      PT LONG TERM GOAL #3   Title Patient will be able to demonstrate active right knee extension with 5 degrees of neutral.    Time 4    Period Weeks    Status New    Target Date 12/30/21      PT LONG TERM GOAL #4   Title  Patient will be able to safely ambulate at least 80 feet without an assistive device for improved household mobility.    Time 4    Period Weeks    Status New    Target Date 12/30/21                   Plan - 12/08/21 0856     Clinical Impression Statement Patient presented in clinic with reports of continued high level R knee pain especially with knee flexion. Patient able to push into ROM exercises some. Increased edema notable throughout the RLE especially in thigh region and knee. No TED hose donned but patient encouraged to don TED hose upon arrival back home. Normal vasopneumatic response noted following removal of the modality.    Personal Factors and Comorbidities Comorbidity 1;Comorbidity 2;Comorbidity 3+    Comorbidities diabetes, HTN, OA    Examination-Activity Limitations Locomotion Level;Transfers;Stairs;Stand;Squat;Carry;Sleep    Examination-Participation Restrictions Yard Work    Stability/Clinical Decision Making Evolving/Moderate complexity    Rehab Potential Excellent    PT Frequency 3x / week    PT Duration 4 weeks    PT Treatment/Interventions ADLs/Self Care Home Management;Cryotherapy;Electrical Stimulation;Moist Heat;Gait training;Stair training;Functional mobility training;Therapeutic activities;Therapeutic exercise;Balance training;Neuromuscular re-education;Manual techniques;Patient/family education;Passive range of motion;Energy conservation;Taping;Vasopneumatic Device    PT Next Visit Plan nustep, PROM, AAROM, and AROM interventions for improved knee mobility    Consulted and Agree with Plan of Care Patient             Patient will benefit from skilled therapeutic intervention in order to improve the following deficits and impairments:  Abnormal gait, Decreased range of motion, Difficulty walking, Decreased activity tolerance, Pain, Decreased balance, Decreased mobility, Decreased strength, Increased edema  Visit Diagnosis: Acute pain of right  knee  Stiffness of right knee, not elsewhere classified     Problem List Patient Active Problem List   Diagnosis Date Noted   Primary osteoarthritis  of right knee 11/29/2021   Hypertension 05/03/2021   OA (osteoarthritis) of knee 03/08/2021   Primary osteoarthritis of left knee 03/08/2021   Abdominal aortic ectasia ([place]) 12/31/2020   Constipation 12/31/2020   Idiopathic gout 12/31/2020   Mixed hyperlipidemia 12/31/2020   Morbid obesity ([place]) 12/31/2020   Personal history of colonic polyps 12/31/2020   Polyneuropathy in diseases classified elsewhere ([place]) 12/31/2020   Type 2 diabetes mellitus with other circulatory complications ([place]) 28/31/5176   Diabetes mellitus with coincident hypertension ([place]) 09/16/2019   Coronary artery disease involving native coronary artery of native heart without angina pectoris 09/16/2019   Pure hypercholesterolemia 09/16/2019   Incarcerated umbilical hernia 16/05/3709    Standley Brooking, PTA 12/08/2021, 9:07 AM  [facility] [address] [place], [place], [address] Phone: [phone]   Fax:  [phone]  Name: John Wilson MRN: [id number] Date of Birth: 07/08/1946

## 2021-12-10 ENCOUNTER — Ambulatory Visit: Payer: Medicare HMO | Admitting: Physical Therapy

## 2021-12-10 ENCOUNTER — Other Ambulatory Visit: Payer: Self-pay

## 2021-12-10 DIAGNOSIS — M25661 Stiffness of right knee, not elsewhere classified: Secondary | ICD-10-CM | POA: Diagnosis not present

## 2021-12-10 DIAGNOSIS — M25561 Pain in right knee: Secondary | ICD-10-CM

## 2021-12-10 NOTE — Therapy (Signed)
John Wilson, [place], [address] Phone: [phone]   Fax:  [phone]  Physical Therapy Treatment  Patient Details  Name: John Wilson MRN: [id number] Date of Birth: 1946-06-23 Referring Provider (PT): Maureen Ralphs, MD   Encounter Date: 12/10/2021   PT End of Session - 12/10/21 1110     Visit Number 4    Number of Visits 12    Date for PT Re-Evaluation 12/31/21    PT Start Time 0818    PT Stop Time 0905    PT Time Calculation (min) 47 min    Activity Tolerance Patient tolerated treatment well    Behavior During Therapy [facility] for tasks assessed/performed             Past Medical History:  Diagnosis Date   Arthritis    CAD (coronary artery disease)    Diabetes ([place])    ED (erectile dysfunction)    Gout    Heart murmur    Hyperlipidemia    Hypertension    Obesity    Vertigo     Past Surgical History:  Procedure Laterality Date   [place]  2001   2 separate surgeries, 1 month apart   HERNIA REPAIR  [id number]   Umbilical   LEFT HEART CATH AND CORONARY ANGIOGRAPHY N/A 03/16/2020   Procedure: LEFT HEART CATH AND CORONARY ANGIOGRAPHY;  Surgeon: Troy Sine, MD;  Location: [facility];  Service: Cardiovascular;  Laterality: N/A;   PILONIDAL CYST EXCISION  1979   TOTAL KNEE ARTHROPLASTY Left 03/08/2021   Procedure: TOTAL KNEE ARTHROPLASTY;  Surgeon: Gaynelle Arabian, MD;  [facility];  Service: Orthopedics;  Laterality: Left;  30min   TOTAL KNEE ARTHROPLASTY Right 11/29/2021   Procedure: TOTAL KNEE ARTHROPLASTY;  Surgeon: Gaynelle Arabian, MD;  [facility];  Service: Orthopedics;  Laterality: Right;    There were no vitals filed for this visit.   Subjective Assessment - 12/10/21 1140     Subjective COVID-19 screen performed prior to patient entering clinic.  No new complaints.    Pertinent History diabetes, HTN, OA    Limitations Walking     Patient Stated Goals walking without an assistive device, hunt, fish,    Currently in Pain? Yes    Pain Score 8     Pain Location Knee    Pain Orientation Right    Pain Descriptors / Indicators Discomfort    Pain Onset 1 to 4 weeks ago                [facility] PT Assessment - 12/10/21 0001       PROM   Right Knee Flexion 100                           [facility] Adult PT Treatment/Exercise - 12/10/21 0001       Exercises   Exercises Knee/Hip      Knee/Hip Exercises: Aerobic   Nustep Level 3 x 15 minutes moving seat forward x 2 to increase flexion.      Vasopneumatic   Number Minutes Vasopneumatic  20 minutes    Vasopnuematic Location  --   Right knee.   Vasopneumatic Pressure Medium      Manual Therapy   Manual Therapy Passive ROM    Passive ROM In supine:  PROM x 8 minutes into right knee flexion and extension.  PT Long Term Goals - 12/02/21 2119       PT LONG TERM GOAL #1   Title Patient will be independent with his HEP.    Time 4    Period Weeks    Status New    Target Date 12/30/21      PT LONG TERM GOAL #2   Title Patient will be able to demonstrrate at least 120 degrees of active right knee flexion.    Time 4    Period Weeks    Status New    Target Date 12/30/21      PT LONG TERM GOAL #3   Title Patient will be able to demonstrate active right knee extension with 5 degrees of neutral.    Time 4    Period Weeks    Status New    Target Date 12/30/21      PT LONG TERM GOAL #4   Title Patient will be able to safely ambulate at least 80 feet without an assistive device for improved household mobility.    Time 4    Period Weeks    Status New    Target Date 12/30/21                   Plan - 12/10/21 1143     Clinical Impression Statement Patient did well with treatment today and achieved 100 degrees of passive right knee flexion.    Personal Factors and Comorbidities Comorbidity  1;Comorbidity 2;Comorbidity 3+    Comorbidities diabetes, HTN, OA    Examination-Activity Limitations Locomotion Level;Transfers;Stairs;Stand;Squat;Carry;Sleep    Examination-Participation Restrictions Yard Work    Stability/Clinical Decision Making Evolving/Moderate complexity    Rehab Potential Excellent    PT Frequency 3x / week    PT Duration 4 weeks    PT Treatment/Interventions ADLs/Self Care Home Management;Cryotherapy;Electrical Stimulation;Moist Heat;Gait training;Stair training;Functional mobility training;Therapeutic activities;Therapeutic exercise;Balance training;Neuromuscular re-education;Manual techniques;Patient/family education;Passive range of motion;Energy conservation;Taping;Vasopneumatic Device    PT Next Visit Plan nustep, PROM, AAROM, and AROM interventions for improved knee mobility    Consulted and Agree with Plan of Care Patient             Patient will benefit from skilled therapeutic intervention in order to improve the following deficits and impairments:  Abnormal gait, Decreased range of motion, Difficulty walking, Decreased activity tolerance, Pain, Decreased balance, Decreased mobility, Decreased strength, Increased edema  Visit Diagnosis: Acute pain of right knee  Stiffness of right knee, not elsewhere classified     Problem List Patient Active Problem List   Diagnosis Date Noted   Primary osteoarthritis of right knee 11/29/2021   Hypertension 05/03/2021   OA (osteoarthritis) of knee 03/08/2021   Primary osteoarthritis of left knee 03/08/2021   Abdominal aortic ectasia (HCC) 12/31/2020   Constipation 12/31/2020   Idiopathic gout 12/31/2020   Mixed hyperlipidemia 12/31/2020   Morbid obesity ([place]) 12/31/2020   Personal history of colonic polyps 12/31/2020   Polyneuropathy in diseases classified elsewhere ([place]) 12/31/2020   Type 2 diabetes mellitus with other circulatory complications ([place]) 41/74/0814   Diabetes mellitus with coincident  hypertension ([place]) 09/16/2019   Coronary artery disease involving native coronary artery of native heart without angina pectoris 09/16/2019   Pure hypercholesterolemia 09/16/2019   Incarcerated umbilical hernia 48/18/5631    John Wilson, [place], PT 12/10/2021, 11:45 AM  [facility] [address] [place], [place], [address] Phone: [phone]   Fax:  [phone]  Name: John Wilson MRN: [id number] Date of Birth: 05/14/1946

## 2021-12-13 ENCOUNTER — Other Ambulatory Visit: Payer: Self-pay

## 2021-12-13 ENCOUNTER — Ambulatory Visit: Payer: Medicare HMO | Admitting: *Deleted

## 2021-12-13 DIAGNOSIS — M25661 Stiffness of right knee, not elsewhere classified: Secondary | ICD-10-CM

## 2021-12-13 DIAGNOSIS — M25561 Pain in right knee: Secondary | ICD-10-CM

## 2021-12-13 NOTE — Therapy (Signed)
[facility] [place], [place], [address] Phone: [phone]   Fax:  [phone]  Physical Therapy Treatment  Patient Details  Name: John Wilson MRN: [id number] Date of Birth: [birth year] Referring Provider (PT): Maureen Ralphs, MD   Encounter Date: 12/13/2021   PT End of Session - 12/13/21 0813     Visit Number 5    Number of Visits 12    Date for PT Re-Evaluation 12/31/21    PT Start Time 0815    PT Stop Time 0905    PT Time Calculation (min) 50 min             Past Medical History:  Diagnosis Date   Arthritis    CAD (coronary artery disease)    Diabetes ([place])    ED (erectile dysfunction)    Gout    Heart murmur    Hyperlipidemia    Hypertension    Obesity    Vertigo     Past Surgical History:  Procedure Laterality Date   [place]  2001   2 separate surgeries, 1 month apart   HERNIA REPAIR  9675   Umbilical   LEFT HEART CATH AND CORONARY ANGIOGRAPHY N/A 03/16/2020   Procedure: LEFT HEART CATH AND CORONARY ANGIOGRAPHY;  Surgeon: Troy Sine, MD;  Location: [facility];  Service: Cardiovascular;  Laterality: N/A;   PILONIDAL CYST EXCISION  1979   TOTAL KNEE ARTHROPLASTY Left 03/08/2021   Procedure: TOTAL KNEE ARTHROPLASTY;  Surgeon: Gaynelle Arabian, MD;  [facility];  Service: Orthopedics;  Laterality: Left;  41min   TOTAL KNEE ARTHROPLASTY Right 11/29/2021   Procedure: TOTAL KNEE ARTHROPLASTY;  Surgeon: Gaynelle Arabian, MD;  [facility];  Service: Orthopedics;  Laterality: Right;    There were no vitals filed for this visit.   Subjective Assessment - 12/13/21 0821     Subjective COVID-19 screen performed prior to patient entering clinic. RT knee swelling still and painful    Pertinent History diabetes, HTN, OA    Limitations Walking    Patient Stated Goals walking without an assistive device, hunt, fish,    Currently in Pain? Yes     Pain Score 8     Pain Location Knee    Pain Orientation Right    Pain Descriptors / Indicators Discomfort    Pain Type Surgical pain    Pain Onset 1 to 4 weeks ago                               [facility] Adult PT Treatment/Exercise - 12/13/21 0001       Exercises   Exercises Knee/Hip      Knee/Hip Exercises: Aerobic   Nustep Level 3 x 15 minutes moving seat 11      Knee/Hip Exercises: Standing   Forward Lunges Right;20 reps;2 seconds    Forward Lunges Limitations 8" step    Rocker Board 3 minutes      Knee/Hip Exercises: Seated   Long Arc Quad Right;AROM;3 sets;10 reps      Modalities   Modalities Vasopneumatic      Vasopneumatic   Number Minutes Vasopneumatic  15 minutes    Vasopnuematic Location  --   Right knee.   Vasopneumatic Pressure Medium    Vasopneumatic Temperature  34      Manual Therapy   Manual Therapy Passive ROM    Passive  ROM In supine:  PROM x 8 minutes into extension.                          PT Long Term Goals - 12/02/21 1610       PT LONG TERM GOAL #1   Title Patient will be independent with his HEP.    Time 4    Period Weeks    Status New    Target Date 12/30/21      PT LONG TERM GOAL #2   Title Patient will be able to demonstrrate at least 120 degrees of active right knee flexion.    Time 4    Period Weeks    Status New    Target Date 12/30/21      PT LONG TERM GOAL #3   Title Patient will be able to demonstrate active right knee extension with 5 degrees of neutral.    Time 4    Period Weeks    Status New    Target Date 12/30/21      PT LONG TERM GOAL #4   Title Patient will be able to safely ambulate at least 80 feet without an assistive device for improved household mobility.    Time 4    Period Weeks    Status New    Target Date 12/30/21                   Plan - 12/13/21 9604     Clinical Impression Statement Pt arrived today doing fairly well with RT knee, but continues to  have edema lower leg and RT knee. He reports wearing Ted hose for edema control and has a f/u visit with MD tomorrow. Rx focused on increased ROM, quad activation and edema control.    Personal Factors and Comorbidities Comorbidity 1;Comorbidity 2;Comorbidity 3+    Comorbidities diabetes, HTN, OA    Examination-Activity Limitations Locomotion Level;Transfers;Stairs;Stand;Squat;Carry;Sleep    Stability/Clinical Decision Making Evolving/Moderate complexity    Rehab Potential Excellent    PT Frequency 3x / week    PT Duration 4 weeks    PT Treatment/Interventions ADLs/Self Care Home Management;Cryotherapy;Electrical Stimulation;Moist Heat;Gait training;Stair training;Functional mobility training;Therapeutic activities;Therapeutic exercise;Balance training;Neuromuscular re-education;Manual techniques;Patient/family education;Passive range of motion;Energy conservation;Taping;Vasopneumatic Device    PT Next Visit Plan nustep, PROM, AAROM, and AROM interventions for improved knee mobility    Consulted and Agree with Plan of Care Patient             Patient will benefit from skilled therapeutic intervention in order to improve the following deficits and impairments:  Abnormal gait, Decreased range of motion, Difficulty walking, Decreased activity tolerance, Pain, Decreased balance, Decreased mobility, Decreased strength, Increased edema  Visit Diagnosis: Acute pain of right knee  Stiffness of right knee, not elsewhere classified     Problem List Patient Active Problem List   Diagnosis Date Noted   Primary osteoarthritis of right knee 11/29/2021   Hypertension 05/03/2021   OA (osteoarthritis) of knee 03/08/2021   Primary osteoarthritis of left knee 03/08/2021   Abdominal aortic ectasia (HCC) 12/31/2020   Constipation 12/31/2020   Idiopathic gout 12/31/2020   Mixed hyperlipidemia 12/31/2020   Morbid obesity ([place]) 12/31/2020   Personal history of colonic polyps 12/31/2020    Polyneuropathy in diseases classified elsewhere ([place]) 12/31/2020   Type 2 diabetes mellitus with other circulatory complications ([place]) 54/07/8118   Diabetes mellitus with coincident hypertension ([place]) 09/16/2019   Coronary artery disease involving native coronary artery  of native heart without angina pectoris 09/16/2019   Pure hypercholesterolemia 09/16/2019   Incarcerated umbilical hernia 35/24/8185    Woodrow Dulski,CHRIS, PTA 12/13/2021, 9:19 AM  [facility] [place], [place], [address] Phone: [phone]   Fax:  [phone]  Name: Chilton Sallade MRN: [id number] Date of Birth: 10-28-1946

## 2021-12-15 ENCOUNTER — Ambulatory Visit: Payer: Medicare HMO | Attending: Orthopedic Surgery

## 2021-12-15 ENCOUNTER — Other Ambulatory Visit: Payer: Self-pay

## 2021-12-15 DIAGNOSIS — M25561 Pain in right knee: Secondary | ICD-10-CM | POA: Insufficient documentation

## 2021-12-15 DIAGNOSIS — M25661 Stiffness of right knee, not elsewhere classified: Secondary | ICD-10-CM | POA: Diagnosis not present

## 2021-12-15 NOTE — Therapy (Signed)
[facility] [place], [place], [address] Phone: [phone]   Fax:  [phone]  Physical Therapy Treatment  Patient Details  Name: John Wilson MRN: [id number] Date of Birth: [birth year] Referring Provider (PT): Maureen Ralphs, MD   Encounter Date: 12/15/2021   PT End of Session - 12/15/21 0821     Visit Number 6    Number of Visits 12    Date for PT Re-Evaluation 12/31/21    PT Start Time 0815    PT Stop Time 0915    PT Time Calculation (min) 60 min    Activity Tolerance Patient tolerated treatment well    Behavior During Therapy [facility] for tasks assessed/performed             Past Medical History:  Diagnosis Date   Arthritis    CAD (coronary artery disease)    Diabetes ([place])    ED (erectile dysfunction)    Gout    Heart murmur    Hyperlipidemia    Hypertension    Obesity    Vertigo     Past Surgical History:  Procedure Laterality Date   [place]  2001   2 separate surgeries, 1 month apart   HERNIA REPAIR  [id number]   Umbilical   LEFT HEART CATH AND CORONARY ANGIOGRAPHY N/A 03/16/2020   Procedure: LEFT HEART CATH AND CORONARY ANGIOGRAPHY;  Surgeon: Troy Sine, MD;  Location: [place] [facility];  Service: Cardiovascular;  Laterality: N/A;   PILONIDAL CYST EXCISION  1979   TOTAL KNEE ARTHROPLASTY Left 03/08/2021   Procedure: TOTAL KNEE ARTHROPLASTY;  Surgeon: Gaynelle Arabian, MD;  Location: [facility];  Service: Orthopedics;  Laterality: Left;  34min   TOTAL KNEE ARTHROPLASTY Right 11/29/2021   Procedure: TOTAL KNEE ARTHROPLASTY;  Surgeon: Gaynelle Arabian, MD;  Location: [facility];  Service: Orthopedics;  Laterality: Right;    There were no vitals filed for this visit.   Subjective Assessment - 12/15/21 0817     Subjective COVID-19 screen performed prior to patient entering clinic. Patient reports that he saw his referring physician yesterday and notes there was no  major concerns. His knee fells about the same otherwise.    Pertinent History diabetes, HTN, OA    Limitations Walking    Patient Stated Goals walking without an assistive device, hunt, fish,    Currently in Pain? Yes    Pain Score 8     Pain Location Knee    Pain Orientation Right    Pain Type Surgical pain    Pain Onset 1 to 4 weeks ago                               [facility] Adult PT Treatment/Exercise - 12/15/21 0001       Knee/Hip Exercises: Aerobic   Nustep L4 x 15 minutes; seat 10      Knee/Hip Exercises: Standing   Forward Lunges Right;20 reps    Forward Lunges Limitations 8" step    Forward Step Up Right;20 reps;Hand Hold: 2;Step Height: 6"    Rocker Board 2 minutes      Knee/Hip Exercises: Seated   Long Arc Quad Right;AROM;3 sets;10 reps    Hamstring Curl Right;20 reps   red t-band     Modalities   Modalities Vasopneumatic      Vasopneumatic   Number Minutes Vasopneumatic  15 minutes  Vasopnuematic Location  Knee    Vasopneumatic Pressure Low    Vasopneumatic Temperature  34      Manual Therapy   Manual Therapy Edema management;Passive ROM    Edema Management retrograde massage to reduce RLE edema    Passive ROM seated; flexion and extension with overpressure to tolerance                          PT Long Term Goals - 12/02/21 7741       PT LONG TERM GOAL #1   Title Patient will be independent with his HEP.    Time 4    Period Weeks    Status New    Target Date 12/30/21      PT LONG TERM GOAL #2   Title Patient will be able to demonstrrate at least 120 degrees of active right knee flexion.    Time 4    Period Weeks    Status New    Target Date 12/30/21      PT LONG TERM GOAL #3   Title Patient will be able to demonstrate active right knee extension with 5 degrees of neutral.    Time 4    Period Weeks    Status New    Target Date 12/30/21      PT LONG TERM GOAL #4   Title Patient will be able to safely  ambulate at least 80 feet without an assistive device for improved household mobility.    Time 4    Period Weeks    Status New    Target Date 12/30/21                   Plan - 12/15/21 2878     Clinical Impression Statement Patient was introduced to step ups and resisted knee flexion with moderate difficulty. He required minimal cuing with today's interventions for proper exercise performance initially. Manual therapy focused on improved knee mobility and reduced edema through the use of PROM and retrograde massage. He reported that his knee felt a little sore upon the conclusion of treatment. He continues to require skilled physical therapy to address his remaining impairments to return to his prior level of function.    Personal Factors and Comorbidities Comorbidity 1;Comorbidity 2;Comorbidity 3+    Comorbidities diabetes, HTN, OA    Examination-Activity Limitations Locomotion Level;Transfers;Stairs;Stand;Squat;Carry;Sleep    Stability/Clinical Decision Making Evolving/Moderate complexity    Rehab Potential Excellent    PT Frequency 3x / week    PT Duration 4 weeks    PT Treatment/Interventions ADLs/Self Care Home Management;Cryotherapy;Electrical Stimulation;Moist Heat;Gait training;Stair training;Functional mobility training;Therapeutic activities;Therapeutic exercise;Balance training;Neuromuscular re-education;Manual techniques;Patient/family education;Passive range of motion;Energy conservation;Taping;Vasopneumatic Device    PT Next Visit Plan nustep, PROM, AAROM, and AROM interventions for improved knee mobility    Consulted and Agree with Plan of Care Patient             Patient will benefit from skilled therapeutic intervention in order to improve the following deficits and impairments:  Abnormal gait, Decreased range of motion, Difficulty walking, Decreased activity tolerance, Pain, Decreased balance, Decreased mobility, Decreased strength, Increased edema  Visit  Diagnosis: Acute pain of right knee  Stiffness of right knee, not elsewhere classified     Problem List Patient Active Problem List   Diagnosis Date Noted   Primary osteoarthritis of right knee 11/29/2021   Hypertension 05/03/2021   OA (osteoarthritis) of knee 03/08/2021   Primary osteoarthritis of  left knee 03/08/2021   Abdominal aortic ectasia (HCC) 12/31/2020   Constipation 12/31/2020   Idiopathic gout 12/31/2020   Mixed hyperlipidemia 12/31/2020   Morbid obesity ([place]) 12/31/2020   Personal history of colonic polyps 12/31/2020   Polyneuropathy in diseases classified elsewhere ([place]) 12/31/2020   Type 2 diabetes mellitus with other circulatory complications ([place]) 95/58/3167   Diabetes mellitus with coincident hypertension ([place]) 09/16/2019   Coronary artery disease involving native coronary artery of native heart without angina pectoris 09/16/2019   Pure hypercholesterolemia 09/16/2019   Incarcerated umbilical hernia 42/55/2589    Darlin Coco, PT 12/15/2021, 9:24 AM  [facility] [address] [place], [place], [address] Phone: [phone]   Fax:  [phone]  Name: John Wilson MRN: [id number] Date of Birth: Nov 02, 1946

## 2021-12-17 ENCOUNTER — Other Ambulatory Visit: Payer: Self-pay

## 2021-12-17 ENCOUNTER — Ambulatory Visit: Payer: Medicare HMO | Admitting: Physical Therapy

## 2021-12-17 DIAGNOSIS — M25661 Stiffness of right knee, not elsewhere classified: Secondary | ICD-10-CM

## 2021-12-17 DIAGNOSIS — M25561 Pain in right knee: Secondary | ICD-10-CM | POA: Diagnosis not present

## 2021-12-17 NOTE — Therapy (Signed)
[facility] [place], [place], [address] Phone: [phone]   Fax:  [phone]  Physical Therapy Treatment  Patient Details  Name: John Wilson MRN: [id number] Date of Birth: [birth year] Referring Provider (PT): Maureen Ralphs, MD   Encounter Date: 12/17/2021   PT End of Session - 12/17/21 1304     Visit Number 7    Number of Visits 12    Date for PT Re-Evaluation 12/31/21    PT Start Time 0815    PT Stop Time 0905    PT Time Calculation (min) 50 min    Activity Tolerance Patient tolerated treatment well    Behavior During Therapy John Wilson for tasks assessed/performed             Past Medical History:  Diagnosis Date   Arthritis    CAD (coronary artery disease)    Diabetes ([place])    ED (erectile dysfunction)    Gout    Heart murmur    Hyperlipidemia    Hypertension    Obesity    Vertigo     Past Surgical History:  Procedure Laterality Date   [place]  2001   2 separate surgeries, 1 month apart   HERNIA REPAIR  6237   Umbilical   LEFT HEART CATH AND CORONARY ANGIOGRAPHY N/A 03/16/2020   Procedure: LEFT HEART CATH AND CORONARY ANGIOGRAPHY;  Surgeon: Troy Sine, MD;  Location: [facility];  Service: Cardiovascular;  Laterality: N/A;   PILONIDAL CYST EXCISION  1979   TOTAL KNEE ARTHROPLASTY Left 03/08/2021   Procedure: TOTAL KNEE ARTHROPLASTY;  Surgeon: Gaynelle Arabian, MD;  [facility]: [facility];  Service: Orthopedics;  Laterality: Left;  61min   TOTAL KNEE ARTHROPLASTY Right 11/29/2021   Procedure: TOTAL KNEE ARTHROPLASTY;  Surgeon: Gaynelle Arabian, MD;  Location: [facility];  Service: Orthopedics;  Laterality: Right;    There were no vitals filed for this visit.   Subjective Assessment - 12/17/21 1304     Subjective COVID-19 screen performed prior to patient entering clinic.  This knee hurts a lot more than the last one.    Pertinent History diabetes, HTN,  OA    Limitations Walking    Patient Stated Goals walking without an assistive device, hunt, fish,    Currently in Pain? Yes    Pain Score 8     Pain Location Knee    Pain Orientation Right    Pain Descriptors / Indicators Discomfort    Pain Type Surgical pain                [facility] PT Assessment - 12/17/21 0001       PROM   Right Knee Flexion 107                           [facility] Adult PT Treatment/Exercise - 12/17/21 0001       Exercises   Exercises Knee/Hip      Knee/Hip Exercises: Aerobic   Nustep Level 4 x 15 minutes.      Vasopneumatic   Number Minutes Vasopneumatic  20 minutes    Vasopnuematic Location  --   Right knee.   Vasopneumatic Pressure Medium      Manual Therapy   Manual Therapy Passive ROM    Passive ROM In supine:  PROM x 8 minutes to patient's right knee into flexion and extension to patient tolerance.  PT Long Term Goals - 12/02/21 8502       PT LONG TERM GOAL #1   Title Patient will be independent with his HEP.    Time 4    Period Weeks    Status New    Target Date 12/30/21      PT LONG TERM GOAL #2   Title Patient will be able to demonstrrate at least 120 degrees of active right knee flexion.    Time 4    Period Weeks    Status New    Target Date 12/30/21      PT LONG TERM GOAL #3   Title Patient will be able to demonstrate active right knee extension with 5 degrees of neutral.    Time 4    Period Weeks    Status New    Target Date 12/30/21      PT LONG TERM GOAL #4   Title Patient will be able to safely ambulate at least 80 feet without an assistive device for improved household mobility.    Time 4    Period Weeks    Status New    Target Date 12/30/21                   Plan - 12/17/21 1309     Clinical Impression Statement Patient continues to reports a lot of right knee pain but was able to achieve passive flexion to 107 degrees today.    Personal  Factors and Comorbidities Comorbidity 1;Comorbidity 2;Comorbidity 3+    Comorbidities diabetes, HTN, OA    Examination-Activity Limitations Locomotion Level;Transfers;Stairs;Stand;Squat;Carry;Sleep    Examination-Participation Restrictions Yard Work    Stability/Clinical Decision Making Evolving/Moderate complexity    Rehab Potential Excellent    PT Frequency 3x / week    PT Duration 4 weeks    PT Treatment/Interventions ADLs/Self Care Home Management;Cryotherapy;Electrical Stimulation;Moist Heat;Gait training;Stair training;Functional mobility training;Therapeutic activities;Therapeutic exercise;Balance training;Neuromuscular re-education;Manual techniques;Patient/family education;Passive range of motion;Energy conservation;Taping;Vasopneumatic Device    PT Next Visit Plan nustep, PROM, AAROM, and AROM interventions for improved knee mobility    Consulted and Agree with Plan of Care Patient             Patient will benefit from skilled therapeutic intervention in order to improve the following deficits and impairments:  Abnormal gait, Decreased range of motion, Difficulty walking, Decreased activity tolerance, Pain, Decreased balance, Decreased mobility, Decreased strength, Increased edema  Visit Diagnosis: Acute pain of right knee  Stiffness of right knee, not elsewhere classified     Problem List Patient Active Problem List   Diagnosis Date Noted   Primary osteoarthritis of right knee 11/29/2021   Hypertension 05/03/2021   OA (osteoarthritis) of knee 03/08/2021   Primary osteoarthritis of left knee 03/08/2021   Abdominal aortic ectasia (HCC) 12/31/2020   Constipation 12/31/2020   Idiopathic gout 12/31/2020   Mixed hyperlipidemia 12/31/2020   Morbid obesity ([place]) 12/31/2020   Personal history of colonic polyps 12/31/2020   Polyneuropathy in diseases classified elsewhere ([place]) 12/31/2020   Type 2 diabetes mellitus with other circulatory complications ([place]) 77/41/2878    Diabetes mellitus with coincident hypertension ([place]) 09/16/2019   Coronary artery disease involving native coronary artery of native heart without angina pectoris 09/16/2019   Pure hypercholesterolemia 09/16/2019   Incarcerated umbilical hernia 67/67/2094    Laurina Fischl, [place], PT 12/17/2021, 1:12 PM  [facility] [address] [place], [place], [address] Phone: [phone]   Fax:  [phone]  Name: John Wilson MRN:  [id number] Date of Birth: [birth year]

## 2021-12-20 ENCOUNTER — Ambulatory Visit: Payer: Medicare HMO | Admitting: Physical Therapy

## 2021-12-20 ENCOUNTER — Encounter: Payer: Self-pay | Admitting: Physical Therapy

## 2021-12-20 ENCOUNTER — Other Ambulatory Visit: Payer: Self-pay

## 2021-12-20 DIAGNOSIS — M25661 Stiffness of right knee, not elsewhere classified: Secondary | ICD-10-CM | POA: Diagnosis not present

## 2021-12-20 DIAGNOSIS — M25561 Pain in right knee: Secondary | ICD-10-CM | POA: Diagnosis not present

## 2021-12-20 NOTE — Therapy (Signed)
[facility] [place], [place], [address] Phone: [phone]   Fax:  [phone]  Physical Therapy Treatment  Patient Details  Name: John Wilson MRN: [id number] Date of Birth: November 19, 1945 Referring Provider (PT): Maureen Ralphs, MD   Encounter Date: 12/20/2021   PT End of Session - 12/20/21 0906     Visit Number 8    Number of Visits 12    Date for PT Re-Evaluation 12/31/21    PT Start Time 0902    PT Stop Time 0949    PT Time Calculation (min) 47 min    Equipment Utilized During Treatment Other (comment)   SBQC   Activity Tolerance Patient tolerated treatment well    Behavior During Therapy [facility] for tasks assessed/performed             Past Medical History:  Diagnosis Date   Arthritis    CAD (coronary artery disease)    Diabetes ([place])    ED (erectile dysfunction)    Gout    Heart murmur    Hyperlipidemia    Hypertension    Obesity    Vertigo     Past Surgical History:  Procedure Laterality Date   [place]  2001   2 separate surgeries, 1 month apart   HERNIA REPAIR  1448   Umbilical   LEFT HEART CATH AND CORONARY ANGIOGRAPHY N/A 03/16/2020   Procedure: LEFT HEART CATH AND CORONARY ANGIOGRAPHY;  Surgeon: Troy Sine, MD;  Location: [place];  Service: Cardiovascular;  Laterality: N/A;   PILONIDAL CYST EXCISION  1979   TOTAL KNEE ARTHROPLASTY Left 03/08/2021   Procedure: TOTAL KNEE ARTHROPLASTY;  Surgeon: Gaynelle Arabian, MD;  [facility]: [facility];  Service: Orthopedics;  Laterality: Left;  67min   TOTAL KNEE ARTHROPLASTY Right 11/29/2021   Procedure: TOTAL KNEE ARTHROPLASTY;  Surgeon: Gaynelle Arabian, MD;  [facility]: [facility];  Service: Orthopedics;  Laterality: Right;    There were no vitals filed for this visit.   Subjective Assessment - 12/20/21 0906     Subjective COVID-19 screen performed prior to patient entering clinic.  States that his  swelling is beginning to go down now.    Pertinent History diabetes, HTN, OA    Limitations Walking    Patient Stated Goals walking without an assistive device, hunt, fish,    Currently in Pain? Yes    Pain Score 6     Pain Location Knee    Pain Orientation Right    Pain Descriptors / Indicators Discomfort    Pain Type Surgical pain    Pain Onset 1 to 4 weeks ago    Pain Frequency Intermittent                [facility] PT Assessment - 12/20/21 0001       Assessment   Medical Diagnosis Right TKA    Referring Provider (PT) Maureen Ralphs, MD    Onset Date/Surgical Date 11/29/21    Hand Dominance Right    Next MD Visit 01/07/2022    Prior Therapy Yes, acute care      Precautions   Precautions None      Restrictions   Weight Bearing Restrictions No      ROM / Strength   AROM / PROM / Strength AROM      AROM   Overall AROM  Deficits    AROM Assessment Site Knee    Right/Left Knee Right  Right Knee Extension 2    Right Knee Flexion 105                           [facility] Adult PT Treatment/Exercise - 12/20/21 0001       Knee/Hip Exercises: Aerobic   Nustep Level 4, seat 10-9 x 15 minutes.      Knee/Hip Exercises: Standing   Forward Lunges Right;20 reps    Forward Lunges Limitations 14" step    Forward Step Up Right;20 reps;Hand Hold: 2;Step Height: 6"    Step Down Right;20 reps;Hand Hold: 2;Step Height: 4"    Rocker Board 3 minutes      Knee/Hip Exercises: Seated   Long Arc Quad Strengthening;Right;20 reps;Weights    Long Arc Quad Weight 3 lbs.      Knee/Hip Exercises: Supine   Straight Leg Raises AROM;Right;20 reps      Modalities   Modalities Vasopneumatic      Vasopneumatic   Number Minutes Vasopneumatic  10 minutes    Vasopnuematic Location  Knee    Vasopneumatic Pressure Medium    Vasopneumatic Temperature  34                          PT Long Term Goals - 12/20/21 0942       PT LONG TERM GOAL #1   Title Patient will be  independent with his HEP.    Time 4    Period Weeks    Status On-going    Target Date 12/30/21      PT LONG TERM GOAL #2   Title Patient will be able to demonstrrate at least 120 degrees of active right knee flexion.    Time 4    Period Weeks    Status On-going    Target Date 12/30/21      PT LONG TERM GOAL #3   Title Patient will be able to demonstrate active right knee extension with 5 degrees of neutral.    Time 4    Period Weeks    Status Achieved    Target Date 12/30/21      PT LONG TERM GOAL #4   Title Patient will be able to safely ambulate at least 80 feet without an assistive device for improved household mobility.    Time 4    Period Weeks    Status On-going    Target Date 12/30/21                   Plan - 12/20/21 0943     Clinical Impression Statement Patient presented in clinic with reports of less swelling of the Rknee. Patient able to be more compliant with HEP now as swelling is receding. Patient able to tolerate strengthening exercises well. AROM of R knee measured as 2-105 deg. Normal vasopnuematic response noted following removal of the modality. Patient now using SBQC.    Personal Factors and Comorbidities Comorbidity 1;Comorbidity 2;Comorbidity 3+    Comorbidities diabetes, HTN, OA    Examination-Activity Limitations Locomotion Level;Transfers;Stairs;Stand;Squat;Carry;Sleep    Examination-Participation Restrictions Yard Work    Stability/Clinical Decision Making Evolving/Moderate complexity    Rehab Potential Excellent    PT Frequency 3x / week    PT Duration 4 weeks    PT Treatment/Interventions ADLs/Self Care Home Management;Cryotherapy;Electrical Stimulation;Moist Heat;Gait training;Stair training;Functional mobility training;Therapeutic activities;Therapeutic exercise;Balance training;Neuromuscular re-education;Manual techniques;Patient/family education;Passive range of motion;Energy conservation;Taping;Vasopneumatic Device    PT Next Visit  Plan nustep, PROM, AAROM, and AROM interventions for improved knee mobility    Consulted and Agree with Plan of Care Patient             Patient will benefit from skilled therapeutic intervention in order to improve the following deficits and impairments:  Abnormal gait, Decreased range of motion, Difficulty walking, Decreased activity tolerance, Pain, Decreased balance, Decreased mobility, Decreased strength, Increased edema  Visit Diagnosis: Acute pain of right knee  Stiffness of right knee, not elsewhere classified     Problem List Patient Active Problem List   Diagnosis Date Noted   Primary osteoarthritis of right knee 11/29/2021   Hypertension 05/03/2021   OA (osteoarthritis) of knee 03/08/2021   Primary osteoarthritis of left knee 03/08/2021   Abdominal aortic ectasia (HCC) 12/31/2020   Constipation 12/31/2020   Idiopathic gout 12/31/2020   Mixed hyperlipidemia 12/31/2020   Morbid obesity ([place]) 12/31/2020   Personal history of colonic polyps 12/31/2020   Polyneuropathy in diseases classified elsewhere ([place]) 12/31/2020   Type 2 diabetes mellitus with other circulatory complications ([place]) 28/36/6294   Diabetes mellitus with coincident hypertension ([place]) 09/16/2019   Coronary artery disease involving native coronary artery of native heart without angina pectoris 09/16/2019   Pure hypercholesterolemia 09/16/2019   Incarcerated umbilical hernia 76/54/6503    Standley Brooking, PTA 12/20/2021, 11:09 AM  [facility] [address] [place], [place], [address] Phone: [phone]   Fax:  [phone]  Name: John Wilson MRN: [id number] Date of Birth: [birth year]

## 2021-12-22 ENCOUNTER — Encounter: Payer: Self-pay | Admitting: Physical Therapy

## 2021-12-22 ENCOUNTER — Other Ambulatory Visit: Payer: Self-pay

## 2021-12-22 ENCOUNTER — Ambulatory Visit: Payer: Medicare HMO | Admitting: Physical Therapy

## 2021-12-22 DIAGNOSIS — M25561 Pain in right knee: Secondary | ICD-10-CM

## 2021-12-22 DIAGNOSIS — M25661 Stiffness of right knee, not elsewhere classified: Secondary | ICD-10-CM

## 2021-12-22 NOTE — Therapy (Signed)
[facility] [place], [place], [address] Phone: [phone]   Fax:  [phone]  Physical Therapy Treatment  Patient Details  Name: John Wilson MRN: [id number] Date of Birth: May 24, 1946 Referring Provider (PT): Maureen Ralphs, MD   Encounter Date: 12/22/2021   PT End of Session - 12/22/21 0849     Visit Number 9    Number of Visits 12    Date for PT Re-Evaluation 12/31/21    PT Start Time 4970    PT Stop Time 2637    PT Time Calculation (min) 41 min    Activity Tolerance Patient tolerated treatment well    Behavior During Therapy John Wilson for tasks assessed/performed             Past Medical History:  Diagnosis Date   Arthritis    CAD (coronary artery disease)    Diabetes ([place])    ED (erectile dysfunction)    Gout    Heart murmur    Hyperlipidemia    Hypertension    Obesity    Vertigo     Past Surgical History:  Procedure Laterality Date   [place]  2001   2 separate surgeries, 1 month apart   HERNIA REPAIR  [id number]   Umbilical   LEFT HEART CATH AND CORONARY ANGIOGRAPHY N/A 03/16/2020   Procedure: LEFT HEART CATH AND CORONARY ANGIOGRAPHY;  Surgeon: John Sine, MD;  Location: [facility];  Service: Cardiovascular;  Laterality: N/A;   PILONIDAL CYST EXCISION  1979   TOTAL KNEE ARTHROPLASTY Left 03/08/2021   Procedure: TOTAL KNEE ARTHROPLASTY;  Surgeon: John Arabian, MD;  Location: [facility];  Service: Orthopedics;  Laterality: Left;  23min   TOTAL KNEE ARTHROPLASTY Right 11/29/2021   Procedure: TOTAL KNEE ARTHROPLASTY;  Surgeon: John Arabian, MD;  Location: [facility];  Service: Orthopedics;  Laterality: Right;    There were no vitals filed for this visit.   Subjective Assessment - 12/22/21 0845     Subjective COVID-19 screen performed prior to patient entering clinic.  Doing better today.    Pertinent History diabetes, HTN, OA    Limitations Walking     Patient Stated Goals walking without an assistive device, hunt, fish,    Currently in Pain? Yes    Pain Score 5     Pain Orientation Right    Pain Descriptors / Indicators Discomfort    Pain Type Surgical pain    Pain Onset 1 to 4 weeks ago    Pain Frequency Intermittent                [facility] PT Assessment - 12/22/21 0001       PROM   Right Knee Flexion 110                           [facility] Adult PT Treatment/Exercise - 12/22/21 0001       Exercises   Exercises Knee/Hip      Knee/Hip Exercises: Aerobic   Nustep Level 3 x 15 minutes moving seat forward x 2 to increase flexion      Modalities   Modalities Vasopneumatic      Vasopneumatic   Number Minutes Vasopneumatic  15 minutes    Vasopnuematic Location  --   Right knee.   Vasopneumatic Pressure Medium      Manual Therapy   Manual Therapy Passive ROM    Passive ROM  In supine:  PROM to patient tolerance x 8 minutes into right knee flexion and extension                          PT Long Term Goals - 12/20/21 0942       PT LONG TERM GOAL #1   Title Patient will be independent with his HEP.    Time 4    Period Weeks    Status On-going    Target Date 12/30/21      PT LONG TERM GOAL #2   Title Patient will be able to demonstrrate at least 120 degrees of active right knee flexion.    Time 4    Period Weeks    Status On-going    Target Date 12/30/21      PT LONG TERM GOAL #3   Title Patient will be able to demonstrate active right knee extension with 5 degrees of neutral.    Time 4    Period Weeks    Status Achieved    Target Date 12/30/21      PT LONG TERM GOAL #4   Title Patient will be able to safely ambulate at least 80 feet without an assistive device for improved household mobility.    Time 4    Period Weeks    Status On-going    Target Date 12/30/21                   Plan - 12/22/21 0904     Clinical Impression Statement Pain down some today.   Patient walking safely with a quad cane.  He achieved passive righ tknee flexion to 110 degrees today.    Personal Factors and Comorbidities Comorbidity 1;Comorbidity 2;Comorbidity 3+    Comorbidities diabetes, HTN, OA    Examination-Activity Limitations Locomotion Level;Transfers;Stairs;Stand;Squat;Carry;Sleep    Examination-Participation Restrictions Yard Work    Stability/Clinical Decision Making Evolving/Moderate complexity    Rehab Potential Excellent    PT Frequency 3x / week    PT Duration 4 weeks    PT Treatment/Interventions ADLs/Self Care Home Management;Cryotherapy;Electrical Stimulation;Moist Heat;Gait training;Stair training;Functional mobility training;Therapeutic activities;Therapeutic exercise;Balance training;Neuromuscular re-education;Manual techniques;Patient/family education;Passive range of motion;Energy conservation;Taping;Vasopneumatic Device    PT Next Visit Plan nustep, PROM, AAROM, and AROM interventions for improved knee mobility    Consulted and Agree with Plan of Care Patient             Patient will benefit from skilled therapeutic intervention in order to improve the following deficits and impairments:  Abnormal gait, Decreased range of motion, Difficulty walking, Decreased activity tolerance, Pain, Decreased balance, Decreased mobility, Decreased strength, Increased edema  Visit Diagnosis: Acute pain of right knee  Stiffness of right knee, not elsewhere classified     Problem List Patient Active Problem List   Diagnosis Date Noted   Primary osteoarthritis of right knee 11/29/2021   Hypertension 05/03/2021   OA (osteoarthritis) of knee 03/08/2021   Primary osteoarthritis of left knee 03/08/2021   Abdominal aortic ectasia (HCC) 12/31/2020   Constipation 12/31/2020   Idiopathic gout 12/31/2020   Mixed hyperlipidemia 12/31/2020   Morbid obesity ([place]) 12/31/2020   Personal history of colonic polyps 12/31/2020   Polyneuropathy in diseases classified  elsewhere ([place]) 12/31/2020   Type 2 diabetes mellitus with other circulatory complications ([place]) 38/45/3646   Diabetes mellitus with coincident hypertension ([place]) 09/16/2019   Coronary artery disease involving native coronary artery of native heart without angina pectoris 09/16/2019  Pure hypercholesterolemia 09/16/2019   Incarcerated umbilical hernia 65/80/0634    John Wilson, [place], PT 12/22/2021, 9:12 AM  [facility] [place], [place], [address] Phone: [phone]   Fax:  [phone]  Name: John Wilson MRN: [id number] Date of Birth: February 26, 1946

## 2021-12-24 ENCOUNTER — Ambulatory Visit: Payer: Medicare HMO | Admitting: *Deleted

## 2021-12-24 ENCOUNTER — Other Ambulatory Visit: Payer: Self-pay

## 2021-12-24 DIAGNOSIS — M25661 Stiffness of right knee, not elsewhere classified: Secondary | ICD-10-CM

## 2021-12-24 DIAGNOSIS — M25561 Pain in right knee: Secondary | ICD-10-CM

## 2021-12-24 NOTE — Therapy (Signed)
[facility] [place], [place], [address] Phone: [phone]   Fax:  [phone]  Physical Therapy Treatment  Patient Details  Name: John Wilson MRN: [id number] Date of Birth: [birth year] Referring Provider (PT): Maureen Ralphs, MD   Encounter Date: 12/24/2021   PT End of Session - 12/24/21 0804     Visit Number 10    Number of Visits 12    Date for PT Re-Evaluation 12/31/21    PT Start Time 0815    PT Stop Time 0910    PT Time Calculation (min) 55 min             Past Medical History:  Diagnosis Date   Arthritis    CAD (coronary artery disease)    Diabetes ([place])    ED (erectile dysfunction)    Gout    Heart murmur    Hyperlipidemia    Hypertension    Obesity    Vertigo     Past Surgical History:  Procedure Laterality Date   [place]  2001   2 separate surgeries, 1 month apart   HERNIA REPAIR  [id number]   Umbilical   LEFT HEART CATH AND CORONARY ANGIOGRAPHY N/A 03/16/2020   Procedure: LEFT HEART CATH AND CORONARY ANGIOGRAPHY;  Surgeon: Troy Sine, MD;  Location: [facility];  Service: Cardiovascular;  Laterality: N/A;   PILONIDAL CYST EXCISION  1979   TOTAL KNEE ARTHROPLASTY Left 03/08/2021   Procedure: TOTAL KNEE ARTHROPLASTY;  Surgeon: Gaynelle Arabian, MD;  Location: [facility];  Service: Orthopedics;  Laterality: Left;  64min   TOTAL KNEE ARTHROPLASTY Right 11/29/2021   Procedure: TOTAL KNEE ARTHROPLASTY;  Surgeon: Gaynelle Arabian, MD;  Location: [facility];  Service: Orthopedics;  Laterality: Right;    There were no vitals filed for this visit.   Subjective Assessment - 12/24/21 0805     Subjective COVID-19 screen performed prior to patient entering clinic. 4/10 RT knee pain today    Pertinent History diabetes, HTN, OA    Limitations Walking    Patient Stated Goals walking without an assistive device, hunt, fish,    Currently in Pain? Yes    Pain  Score 5     Pain Location Knee    Pain Orientation Right    Pain Descriptors / Indicators Discomfort;Tightness    Pain Type Surgical pain    Pain Onset 1 to 4 weeks ago                               [facility] Adult PT Treatment/Exercise - 12/24/21 0001       Exercises   Exercises Knee/Hip      Knee/Hip Exercises: Aerobic   Nustep Level 6 x 15 minutes moving seat forward x 2 to increase flexion      Knee/Hip Exercises: Standing   Forward Lunges Right;20 reps    Forward Lunges Limitations 14" step    Rocker Board 5 minutes   calf stretching     Modalities   Modalities Vasopneumatic;Electrical Stimulation      Electrical Stimulation   Electrical Stimulation Location RT knee    Electrical Stimulation Action microcurrent    Electrical Stimulation Parameters 2 chs freq. 40/ 195 , intensity 100 both chs    Electrical Stimulation Goals Edema      Vasopneumatic   Number Minutes Vasopneumatic  15 minutes    Vasopnuematic Location  Knee    Vasopneumatic Pressure Medium    Vasopneumatic Temperature  34 for edema      Manual Therapy   Manual Therapy Passive ROM    Passive ROM In supine:  PROM x 7 minutes into extension with active quad sets                          PT Long Term Goals - 12/20/21 0942       PT LONG TERM GOAL #1   Title Patient will be independent with his HEP.    Time 4    Period Weeks    Status On-going    Target Date 12/30/21      PT LONG TERM GOAL #2   Title Patient will be able to demonstrrate at least 120 degrees of active right knee flexion.    Time 4    Period Weeks    Status On-going    Target Date 12/30/21      PT LONG TERM GOAL #3   Title Patient will be able to demonstrate active right knee extension with 5 degrees of neutral.    Time 4    Period Weeks    Status Achieved    Target Date 12/30/21      PT LONG TERM GOAL #4   Title Patient will be able to safely ambulate at least 80 feet without an assistive  device for improved household mobility.    Time 4    Period Weeks    Status On-going    Target Date 12/30/21                   Plan - 12/24/21 0806     Clinical Impression Statement Patient arrived today doing fairly well with RT knee and cc is swelling. Rx focused on ROM and decreased swelling. Decreased pain end of session..Normal Vaso    Personal Factors and Comorbidities Comorbidity 1;Comorbidity 2;Comorbidity 3+    Examination-Activity Limitations Locomotion Level;Transfers;Stairs;Stand;Squat;Carry;Sleep    Stability/Clinical Decision Making Evolving/Moderate complexity    PT Frequency 3x / week    PT Duration 4 weeks    PT Treatment/Interventions ADLs/Self Care Home Management;Cryotherapy;Electrical Stimulation;Moist Heat;Gait training;Stair training;Functional mobility training;Therapeutic activities;Therapeutic exercise;Balance training;Neuromuscular re-education;Manual techniques;Patient/family education;Passive range of motion;Energy conservation;Taping;Vasopneumatic Device    PT Next Visit Plan nustep, PROM, AAROM, and AROM interventions for improved knee mobility    Consulted and Agree with Plan of Care Patient             Patient will benefit from skilled therapeutic intervention in order to improve the following deficits and impairments:  Abnormal gait, Decreased range of motion, Difficulty walking, Decreased activity tolerance, Pain, Decreased balance, Decreased mobility, Decreased strength, Increased edema  Visit Diagnosis: Acute pain of right knee  Stiffness of right knee, not elsewhere classified     Problem List Patient Active Problem List   Diagnosis Date Noted   Primary osteoarthritis of right knee 11/29/2021   Hypertension 05/03/2021   OA (osteoarthritis) of knee 03/08/2021   Primary osteoarthritis of left knee 03/08/2021   Abdominal aortic ectasia (HCC) 12/31/2020   Constipation 12/31/2020   Idiopathic gout 12/31/2020   Mixed  hyperlipidemia 12/31/2020   Morbid obesity ([place]) 12/31/2020   Personal history of colonic polyps 12/31/2020   Polyneuropathy in diseases classified elsewhere ([place]) 12/31/2020   Type 2 diabetes mellitus with other circulatory complications ([place]) 39/01/91   Diabetes mellitus with coincident hypertension ([place]) 09/16/2019   Coronary artery  disease involving native coronary artery of native heart without angina pectoris 09/16/2019   Pure hypercholesterolemia 09/16/2019   Incarcerated umbilical hernia 55/21/7471    Yicel Shannon,CHRIS, PTA 12/24/2021, 9:17 AM  [facility] [place], [place], [address] Phone: [phone]   Fax:  [phone]  Name: John Wilson MRN: [id number] Date of Birth: 02-12-1946

## 2021-12-27 ENCOUNTER — Ambulatory Visit: Payer: Medicare HMO | Admitting: Physical Therapy

## 2021-12-27 ENCOUNTER — Other Ambulatory Visit: Payer: Self-pay

## 2021-12-27 ENCOUNTER — Encounter: Payer: Self-pay | Admitting: Physical Therapy

## 2021-12-27 DIAGNOSIS — M25661 Stiffness of right knee, not elsewhere classified: Secondary | ICD-10-CM

## 2021-12-27 DIAGNOSIS — M25561 Pain in right knee: Secondary | ICD-10-CM

## 2021-12-27 NOTE — Therapy (Signed)
[facility] [place], [place], [address] Phone: [phone]   Fax:  [phone]  Physical Therapy Treatment  Patient Details  Name: John Wilson MRN: [id number] Date of Birth: 1946/04/05 Referring Provider (PT): Maureen Ralphs, MD   Encounter Date: 12/27/2021   PT End of Session - 12/27/21 0745     Visit Number 11    Number of Visits 12    Date for PT Re-Evaluation 12/31/21    PT Start Time 1027    PT Stop Time 0817    PT Time Calculation (min) 43 min    Equipment Utilized During Treatment Other (comment)   SBQC   Activity Tolerance Patient tolerated treatment well    Behavior During Therapy [facility] for tasks assessed/performed             Past Medical History:  Diagnosis Date   Arthritis    CAD (coronary artery disease)    Diabetes ([place])    ED (erectile dysfunction)    Gout    Heart murmur    Hyperlipidemia    Hypertension    Obesity    Vertigo     Past Surgical History:  Procedure Laterality Date   [place]  2001   2 separate surgeries, 1 month apart   HERNIA REPAIR  2536   Umbilical   LEFT HEART CATH AND CORONARY ANGIOGRAPHY N/A 03/16/2020   Procedure: LEFT HEART CATH AND CORONARY ANGIOGRAPHY;  Surgeon: Troy Sine, MD;  Location: [facility];  Service: Cardiovascular;  Laterality: N/A;   PILONIDAL CYST EXCISION  1979   TOTAL KNEE ARTHROPLASTY Left 03/08/2021   Procedure: TOTAL KNEE ARTHROPLASTY;  Surgeon: Gaynelle Arabian, MD;  [facility];  Service: Orthopedics;  Laterality: Left;  65min   TOTAL KNEE ARTHROPLASTY Right 11/29/2021   Procedure: TOTAL KNEE ARTHROPLASTY;  Surgeon: Gaynelle Arabian, MD;  [facility]: [facility];  Service: Orthopedics;  Laterality: Right;    There were no vitals filed for this visit.   Subjective Assessment - 12/27/21 0744     Subjective COVID-19 screen performed prior to patient entering clinic. 5/10 RT knee pain  today    Pertinent History diabetes, HTN, OA    Limitations Walking    Patient Stated Goals walking without an assistive device, hunt, fish,    Currently in Pain? Yes    Pain Score 5     Pain Location Knee    Pain Orientation Right    Pain Descriptors / Indicators Discomfort    Pain Type Surgical pain    Pain Onset More than a month ago    Pain Frequency Intermittent                [facility] PT Assessment - 12/27/21 0001       Assessment   Medical Diagnosis Right TKA    Referring Provider (PT) Maureen Ralphs, MD    Onset Date/Surgical Date 11/29/21    Hand Dominance Right    Next MD Visit 01/07/2022    Prior Therapy Yes, acute care      Precautions   Precautions None      Restrictions   Weight Bearing Restrictions No                           [facility] Adult PT Treatment/Exercise - 12/27/21 0001       Knee/Hip Exercises: Aerobic   Recumbent Bike L1, seat 10 x5  min    Nustep L5, SEAT 6 X10 MIN      Knee/Hip Exercises: Machines for Strengthening   Cybex Knee Extension 10# 2x10 reps    Cybex Knee Flexion 30# 2x10 reps    Cybex Leg Press 2 pl ,seat 7 x30 reps      Knee/Hip Exercises: Standing   Forward Lunges Right;20 reps    Rocker Board 3 minutes      Modalities   Modalities Vasopneumatic      Vasopneumatic   Number Minutes Vasopneumatic  10 minutes    Vasopnuematic Location  Knee    Vasopneumatic Pressure Medium    Vasopneumatic Temperature  34 for edema                          PT Long Term Goals - 12/20/21 6144       PT LONG TERM GOAL #1   Title Patient will be independent with his HEP.    Time 4    Period Weeks    Status On-going    Target Date 12/30/21      PT LONG TERM GOAL #2   Title Patient will be able to demonstrrate at least 120 degrees of active right knee flexion.    Time 4    Period Weeks    Status On-going    Target Date 12/30/21      PT LONG TERM GOAL #3   Title Patient will be able to demonstrate active  right knee extension with 5 degrees of neutral.    Time 4    Period Weeks    Status Achieved    Target Date 12/30/21      PT LONG TERM GOAL #4   Title Patient will be able to safely ambulate at least 80 feet without an assistive device for improved household mobility.    Time 4    Period Weeks    Status On-going    Target Date 12/30/21                   Plan - 12/27/21 3154     Clinical Impression Statement Patient presented in clinic with reports of moderate R knee pain. Patient using [facility] routinely for gait at this time. Patient progressed from Nustep to stationary bike minimally in which fatigued the patient quickly but reported a great stretch. Patient also progressed to more weight training today with no reports of pain. Normal vasopnuematic response noted following removal of the modality.    Personal Factors and Comorbidities Comorbidity 1;Comorbidity 2;Comorbidity 3+    Comorbidities diabetes, HTN, OA    Examination-Activity Limitations Locomotion Level;Transfers;Stairs;Stand;Squat;Carry;Sleep    Examination-Participation Restrictions Yard Work    Stability/Clinical Decision Making Evolving/Moderate complexity    Rehab Potential Excellent    PT Frequency 3x / week    PT Duration 4 weeks    PT Treatment/Interventions ADLs/Self Care Home Management;Cryotherapy;Electrical Stimulation;Moist Heat;Gait training;Stair training;Functional mobility training;Therapeutic activities;Therapeutic exercise;Balance training;Neuromuscular re-education;Manual techniques;Patient/family education;Passive range of motion;Energy conservation;Taping;Vasopneumatic Device    PT Next Visit Plan nustep, PROM, AAROM, and AROM interventions for improved knee mobility    Consulted and Agree with Plan of Care Patient             Patient will benefit from skilled therapeutic intervention in order to improve the following deficits and impairments:  Abnormal gait, Decreased range of motion,  Difficulty walking, Decreased activity tolerance, Pain, Decreased balance, Decreased mobility, Decreased strength, Increased edema  Visit Diagnosis:  Acute pain of right knee  Stiffness of right knee, not elsewhere classified     Problem List Patient Active Problem List   Diagnosis Date Noted   Primary osteoarthritis of right knee 11/29/2021   Hypertension 05/03/2021   OA (osteoarthritis) of knee 03/08/2021   Primary osteoarthritis of left knee 03/08/2021   Abdominal aortic ectasia (HCC) 12/31/2020   Constipation 12/31/2020   Idiopathic gout 12/31/2020   Mixed hyperlipidemia 12/31/2020   Morbid obesity ([place]) 12/31/2020   Personal history of colonic polyps 12/31/2020   Polyneuropathy in diseases classified elsewhere ([place]) 12/31/2020   Type 2 diabetes mellitus with other circulatory complications ([place]) 69/48/5462   Diabetes mellitus with coincident hypertension ([place]) 09/16/2019   Coronary artery disease involving native coronary artery of native heart without angina pectoris 09/16/2019   Pure hypercholesterolemia 09/16/2019   Incarcerated umbilical hernia 70/35/0093    Standley Brooking, PTA 12/27/2021, 8:20 AM  [facility] [address] [place], [place], [address] Phone: [phone]   Fax:  [phone]  Name: Elchonon Maxson MRN: [id number] Date of Birth: 1946/09/22

## 2021-12-29 ENCOUNTER — Ambulatory Visit: Payer: Medicare HMO | Admitting: Physical Therapy

## 2021-12-29 ENCOUNTER — Encounter: Payer: Self-pay | Admitting: Physical Therapy

## 2021-12-29 ENCOUNTER — Other Ambulatory Visit: Payer: Self-pay

## 2021-12-29 DIAGNOSIS — M25561 Pain in right knee: Secondary | ICD-10-CM

## 2021-12-29 DIAGNOSIS — M25661 Stiffness of right knee, not elsewhere classified: Secondary | ICD-10-CM

## 2021-12-29 NOTE — Addendum Note (Signed)
Addended by: Darlin Coco on: 12/29/2021 03:14 PM   Modules accepted: Orders

## 2021-12-29 NOTE — Therapy (Addendum)
[facility] [place], [place], [address] Phone: [phone]   Fax:  [phone]  Physical Therapy Treatment  Patient Details  Name: John Wilson MRN: [id number] Date of Birth: [birth year] Referring Provider (PT): Maureen Ralphs, MD   Encounter Date: 12/29/2021   PT End of Session - 12/29/21 0919     Visit Number 12    Number of Visits 20    Date for PT Re-Evaluation 02/04/22    PT Start Time 0901    PT Stop Time 6063    PT Time Calculation (min) 42 min    Equipment Utilized During Treatment Other (comment)   SBQC   Activity Tolerance Patient tolerated treatment well    Behavior During Therapy [facility] for tasks assessed/performed             Past Medical History:  Diagnosis Date   Arthritis    CAD (coronary artery disease)    Diabetes ([place])    ED (erectile dysfunction)    Gout    Heart murmur    Hyperlipidemia    Hypertension    Obesity    Vertigo     Past Surgical History:  Procedure Laterality Date   [place]  2001   2 separate surgeries, 1 month apart   HERNIA REPAIR  0160   Umbilical   LEFT HEART CATH AND CORONARY ANGIOGRAPHY N/A 03/16/2020   Procedure: LEFT HEART CATH AND CORONARY ANGIOGRAPHY;  Surgeon: Troy Sine, MD;  Location: [facility];  Service: Cardiovascular;  Laterality: N/A;   PILONIDAL CYST EXCISION  1979   TOTAL KNEE ARTHROPLASTY Left 03/08/2021   Procedure: TOTAL KNEE ARTHROPLASTY;  Surgeon: Gaynelle Arabian, MD;  Location: [facility];  Service: Orthopedics;  Laterality: Left;  65min   TOTAL KNEE ARTHROPLASTY Right 11/29/2021   Procedure: TOTAL KNEE ARTHROPLASTY;  Surgeon: Gaynelle Arabian, MD;  Location: [facility];  Service: Orthopedics;  Laterality: Right;    There were no vitals filed for this visit.   Subjective Assessment - 12/29/21 0918     Subjective COVID-19 screen performed prior to patient entering clinic. Reports that he will  be glad when swelling subsides more.    Limitations Walking    Patient Stated Goals walking without an assistive device, hunt, fish,    Currently in Pain? Yes    Pain Score 4     Pain Location Knee    Pain Orientation Right    Pain Descriptors / Indicators Discomfort    Pain Type Surgical pain    Pain Onset More than a month ago    Pain Frequency Intermittent                               [facility] Adult PT Treatment/Exercise - 12/29/21 0001       Knee/Hip Exercises: Aerobic   Nustep L5, sesat 9-8 x15 min      Knee/Hip Exercises: Machines for Strengthening   Cybex Knee Extension 10# 3x10 reps    Cybex Knee Flexion 30# 3x10 reps    Cybex Leg Press 2 pl ,seat 7 x20 reps      Knee/Hip Exercises: Standing   Forward Lunges Right;20 reps    Forward Lunges Limitations 14" step    Lateral Step Up Right;20 reps;Hand Hold: 2;Step Height: 6"    Step Down Right;20 reps;Hand Hold: 2;Step Height: 6"    SLS 4x max RLE  SLS with intermittant UE      Modalities   Modalities Vasopneumatic      Vasopneumatic   Number Minutes Vasopneumatic  10 minutes    Vasopnuematic Location  Knee    Vasopneumatic Pressure Medium    Vasopneumatic Temperature  34 for edema                          PT Long Term Goals - 12/20/21 0737       PT LONG TERM GOAL #1   Title Patient will be independent with his HEP.    Time 4    Period Weeks    Status On-going    Target Date 12/30/21      PT LONG TERM GOAL #2   Title Patient will be able to demonstrrate at least 120 degrees of active right knee flexion.    Time 4    Period Weeks    Status On-going    Target Date 12/30/21      PT LONG TERM GOAL #3   Title Patient will be able to demonstrate active right knee extension with 5 degrees of neutral.    Time 4    Period Weeks    Status Achieved    Target Date 12/30/21      PT LONG TERM GOAL #4   Title Patient will be able to safely ambulate at least 80 feet without an  assistive device for improved household mobility.    Time 4    Period Weeks    Status On-going    Target Date 12/30/21                   Plan - 12/29/21 0948     Clinical Impression Statement Patient presented in clinic with reports of mild R knee pain. Patient progressed through machine strengthening with greater reps. More hip and functional quad strengthening completed in which patient was observed with functional knee ROM and hip strength. Patient still feels lingering strength deficits for his recreational activites in the woods such as hunting, tractors. Swelling remains a high complaint as well. Normal vasopneumatic response noted following removal of the modality.    Personal Factors and Comorbidities Comorbidity 1;Comorbidity 2;Comorbidity 3+    Comorbidities diabetes, HTN, OA    Examination-Activity Limitations Locomotion Level;Transfers;Stairs;Stand;Squat;Carry;Sleep    Examination-Participation Restrictions Yard Work    Stability/Clinical Decision Making Evolving/Moderate complexity    Rehab Potential Excellent    PT Frequency 2x / week    PT Duration 4 weeks    PT Treatment/Interventions ADLs/Self Care Home Management;Cryotherapy;Electrical Stimulation;Moist Heat;Gait training;Stair training;Functional mobility training;Therapeutic activities;Therapeutic exercise;Balance training;Neuromuscular re-education;Manual techniques;Patient/family education;Passive range of motion;Energy conservation;Taping;Vasopneumatic Device    PT Next Visit Plan nustep, PROM, AAROM, and AROM interventions for improved knee mobility    Consulted and Agree with Plan of Care Patient             Patient will benefit from skilled therapeutic intervention in order to improve the following deficits and impairments:  Abnormal gait, Decreased range of motion, Difficulty walking, Decreased activity tolerance, Pain, Decreased balance, Decreased mobility, Decreased strength, Increased edema  Visit  Diagnosis: Acute pain of right knee  Stiffness of right knee, not elsewhere classified     Problem List Patient Active Problem List   Diagnosis Date Noted   Primary osteoarthritis of right knee 11/29/2021   Hypertension 05/03/2021   OA (osteoarthritis) of knee 03/08/2021   Primary osteoarthritis of left knee 03/08/2021  Abdominal aortic ectasia (HCC) 12/31/2020   Constipation 12/31/2020   Idiopathic gout 12/31/2020   Mixed hyperlipidemia 12/31/2020   Morbid obesity ([place]) 12/31/2020   Personal history of colonic polyps 12/31/2020   Polyneuropathy in diseases classified elsewhere ([place]) 12/31/2020   Type 2 diabetes mellitus with other circulatory complications ([place]) 40/68/4033   Diabetes mellitus with coincident hypertension ([place]) 09/16/2019   Coronary artery disease involving native coronary artery of native heart without angina pectoris 09/16/2019   Pure hypercholesterolemia 09/16/2019   Incarcerated umbilical hernia 53/31/7409    Darlin Coco, PT 12/29/2021, 3:12 PM  [facility] [address] [place], [place], [address] Phone: [phone]   Fax:  [phone]  Name: John Wilson MRN: [id number] Date of Birth: September 18, 1946

## 2021-12-31 ENCOUNTER — Ambulatory Visit: Payer: Medicare HMO | Admitting: *Deleted

## 2021-12-31 ENCOUNTER — Other Ambulatory Visit: Payer: Self-pay

## 2021-12-31 DIAGNOSIS — M25561 Pain in right knee: Secondary | ICD-10-CM | POA: Diagnosis not present

## 2021-12-31 DIAGNOSIS — M25661 Stiffness of right knee, not elsewhere classified: Secondary | ICD-10-CM | POA: Diagnosis not present

## 2021-12-31 NOTE — Therapy (Signed)
[facility] [place], [place], [address] Phone: [phone]   Fax:  [phone]  Physical Therapy Treatment  Patient Details  Name: John Wilson MRN: [id number] Date of Birth: June 26, 1946 Referring Provider (PT): Maureen Ralphs, MD   Encounter Date: 12/31/2021   PT End of Session - 12/31/21 0824     Visit Number 13    Number of Visits 20    Date for PT Re-Evaluation 02/04/22    PT Start Time 0815    PT Stop Time 0909    PT Time Calculation (min) 54 min             Past Medical History:  Diagnosis Date   Arthritis    CAD (coronary artery disease)    Diabetes ([place])    ED (erectile dysfunction)    Gout    Heart murmur    Hyperlipidemia    Hypertension    Obesity    Vertigo     Past Surgical History:  Procedure Laterality Date   [place]  2001   2 separate surgeries, 1 month apart   HERNIA REPAIR  3154   Umbilical   LEFT HEART CATH AND CORONARY ANGIOGRAPHY N/A 03/16/2020   Procedure: LEFT HEART CATH AND CORONARY ANGIOGRAPHY;  Surgeon: Troy Sine, MD;  Location: [facility];  Service: Cardiovascular;  Laterality: N/A;   PILONIDAL CYST EXCISION  1979   TOTAL KNEE ARTHROPLASTY Left 03/08/2021   Procedure: TOTAL KNEE ARTHROPLASTY;  Surgeon: Gaynelle Arabian, MD;  [facility];  Service: Orthopedics;  Laterality: Left;  31min   TOTAL KNEE ARTHROPLASTY Right 11/29/2021   Procedure: TOTAL KNEE ARTHROPLASTY;  Surgeon: Gaynelle Arabian, MD;  [facility];  Service: Orthopedics;  Laterality: Right;    There were no vitals filed for this visit.   Subjective Assessment - 12/31/21 0823     Subjective COVID-19 screen performed prior to patient entering clinic. Reports that he will be glad when swelling subsides more. To MD next week    Limitations Walking    Patient Stated Goals walking without an assistive device, hunt, fish,    Currently in Pain? Yes     Pain Score 3     Pain Location Knee    Pain Orientation Right    Pain Descriptors / Indicators Discomfort    Pain Type Surgical pain    Pain Onset More than a month ago                               [facility] Adult PT Treatment/Exercise - 12/31/21 0001       Exercises   Exercises Knee/Hip      Knee/Hip Exercises: Aerobic   Nustep L5, sesat 9-8 x15 min      Knee/Hip Exercises: Standing   Forward Lunges Right;20 reps    Forward Lunges Limitations 14" step    Lateral Step Up Right;20 reps;Hand Hold: 2;Step Height: 6"    Step Down Right;20 reps;Hand Hold: 2;Step Height: 6"    Rocker Board 3 minutes    SLS 4x max RLE SLS with intermittant UE      Modalities   Modalities Vasopneumatic      Electrical Stimulation   Electrical Stimulation Location RT knee    Electrical Stimulation Action microcureent    Electrical Stimulation Parameters 2chs freq 40/195 for swelling Intensity100      Vasopneumatic  Number Minutes Vasopneumatic  10 minutes    Vasopnuematic Location  Knee    Vasopneumatic Pressure Medium    Vasopneumatic Temperature  34 for edema      Manual Therapy   Manual Therapy Passive ROM    Passive ROM In supine:  PROM into extension with active quad sets                          PT Long Term Goals - 12/20/21 0942       PT LONG TERM GOAL #1   Title Patient will be independent with his HEP.    Time 4    Period Weeks    Status On-going    Target Date 12/30/21      PT LONG TERM GOAL #2   Title Patient will be able to demonstrrate at least 120 degrees of active right knee flexion.    Time 4    Period Weeks    Status On-going    Target Date 12/30/21      PT LONG TERM GOAL #3   Title Patient will be able to demonstrate active right knee extension with 5 degrees of neutral.    Time 4    Period Weeks    Status Achieved    Target Date 12/30/21      PT LONG TERM GOAL #4   Title Patient will be able to safely ambulate at  least 80 feet without an assistive device for improved household mobility.    Time 4    Period Weeks    Status On-going    Target Date 12/30/21                   Plan - 12/31/21 0825     Clinical Impression Statement Pt arrived today doing fairly well with RT knee, but continues to have swelling. Rx focused on increased ROM as well as strengthening RT knee and RT LE.    Personal Factors and Comorbidities Comorbidity 1;Comorbidity 2;Comorbidity 3+    Comorbidities diabetes, HTN, OA    Examination-Activity Limitations Locomotion Level;Transfers;Stairs;Stand;Squat;Carry;Sleep    Rehab Potential Excellent    PT Frequency 2x / week    PT Duration 4 weeks    PT Treatment/Interventions ADLs/Self Care Home Management;Cryotherapy;Electrical Stimulation;Moist Heat;Gait training;Stair training;Functional mobility training;Therapeutic activities;Therapeutic exercise;Balance training;Neuromuscular re-education;Manual techniques;Patient/family education;Passive range of motion;Energy conservation;Taping;Vasopneumatic Device    PT Next Visit Plan nustep, PROM, AAROM, and AROM interventions for improved knee mobility    Consulted and Agree with Plan of Care Patient             Patient will benefit from skilled therapeutic intervention in order to improve the following deficits and impairments:  Abnormal gait, Decreased range of motion, Difficulty walking, Decreased activity tolerance, Pain, Decreased balance, Decreased mobility, Decreased strength, Increased edema  Visit Diagnosis: Acute pain of right knee  Stiffness of right knee, not elsewhere classified     Problem List Patient Active Problem List   Diagnosis Date Noted   Primary osteoarthritis of right knee 11/29/2021   Hypertension 05/03/2021   OA (osteoarthritis) of knee 03/08/2021   Primary osteoarthritis of left knee 03/08/2021   Abdominal aortic ectasia (HCC) 12/31/2020   Constipation 12/31/2020   Idiopathic gout  12/31/2020   Mixed hyperlipidemia 12/31/2020   Morbid obesity ([place]) 12/31/2020   Personal history of colonic polyps 12/31/2020   Polyneuropathy in diseases classified elsewhere ([place]) 12/31/2020   Type 2 diabetes mellitus with other circulatory  complications ([place]) 81/38/8719   Diabetes mellitus with coincident hypertension ([place]) 09/16/2019   Coronary artery disease involving native coronary artery of native heart without angina pectoris 09/16/2019   Pure hypercholesterolemia 09/16/2019   Incarcerated umbilical hernia 59/74/7185    Shneur Whittenburg,CHRIS, PTA 12/31/2021, 10:03 AM  [facility] [place], [place], [address] Phone: [phone]   Fax:  [phone]  Name: Shawan Corella MRN: [id number] Date of Birth: [birth year]

## 2022-01-04 ENCOUNTER — Ambulatory Visit: Payer: Medicare HMO | Admitting: Physical Therapy

## 2022-01-04 ENCOUNTER — Other Ambulatory Visit: Payer: Self-pay

## 2022-01-04 ENCOUNTER — Encounter: Payer: Self-pay | Admitting: Physical Therapy

## 2022-01-04 DIAGNOSIS — M25561 Pain in right knee: Secondary | ICD-10-CM

## 2022-01-04 DIAGNOSIS — M25661 Stiffness of right knee, not elsewhere classified: Secondary | ICD-10-CM | POA: Diagnosis not present

## 2022-01-04 NOTE — Therapy (Signed)
[facility] [place], [place], [address] Phone: [phone]   Fax:  [phone]  Physical Therapy Treatment  Patient Details  Name: John Wilson MRN: [id number] Date of Birth: August 26, 1946 Referring Provider (PT): Maureen Ralphs, MD   Encounter Date: 01/04/2022   PT End of Session - 01/04/22 0824     Visit Number 14    Number of Visits 20    Date for PT Re-Evaluation 02/04/22    PT Start Time 0815    PT Stop Time 0911    PT Time Calculation (min) 56 min    Activity Tolerance Patient tolerated treatment well    Behavior During Therapy [facility] for tasks assessed/performed             Past Medical History:  Diagnosis Date   Arthritis    CAD (coronary artery disease)    Diabetes ([place])    ED (erectile dysfunction)    Gout    Heart murmur    Hyperlipidemia    Hypertension    Obesity    Vertigo     Past Surgical History:  Procedure Laterality Date   [place]  2001   2 separate surgeries, 1 month apart   HERNIA REPAIR  9233   Umbilical   LEFT HEART CATH AND CORONARY ANGIOGRAPHY N/A 03/16/2020   Procedure: LEFT HEART CATH AND CORONARY ANGIOGRAPHY;  Surgeon: Troy Sine, MD;  Location: [facility];  Service: Cardiovascular;  Laterality: N/A;   PILONIDAL CYST EXCISION  1979   TOTAL KNEE ARTHROPLASTY Left 03/08/2021   Procedure: TOTAL KNEE ARTHROPLASTY;  Surgeon: Gaynelle Arabian, MD;  [facility]: [facility];  Service: Orthopedics;  Laterality: Left;  60min   TOTAL KNEE ARTHROPLASTY Right 11/29/2021   Procedure: TOTAL KNEE ARTHROPLASTY;  Surgeon: Gaynelle Arabian, MD;  [facility];  Service: Orthopedics;  Laterality: Right;    There were no vitals filed for this visit.   Subjective Assessment - 01/04/22 0824     Subjective COVID-19 screen performed prior to patient entering clinic.  No new complaints.    Pertinent History diabetes, HTN, OA    Limitations Walking     Patient Stated Goals walking without an assistive device, hunt, fish,    Currently in Pain? Yes    Pain Score 3     Pain Location Knee    Pain Orientation Right    Pain Descriptors / Indicators Discomfort    Pain Onset More than a month ago                               [facility] Adult PT Treatment/Exercise - 01/04/22 0001       Exercises   Exercises Knee/Hip      Knee/Hip Exercises: Aerobic   Recumbent Bike Seat 5 x 5 minutes.    Nustep Level 5 x 10 minutes moving seat forward as tolerated to increase knee flexion.      Knee/Hip Exercises: Machines for Strengthening   Cybex Knee Extension 10# x 3 minutes    Cybex Knee Flexion 40# x 3 minutes    Cybex Leg Press 2 plates x 3 minutes.      Modalities   Modalities Psychologist, educational Location RT knee    Electrical Stimulation Action pre-mod.    Electrical Stimulation Parameters 80-150 Hz. x 15 minutes.  Vasopneumatic   Number Minutes Vasopneumatic  15 minutes    Vasopnuematic Location  --   Right knee.   Vasopneumatic Pressure Low      Manual Therapy   Manual Therapy Passive ROM    Passive ROM In supine:  Right knee extension stretching x 6 minutes..                          PT Long Term Goals - 12/20/21 4431       PT LONG TERM GOAL #1   Title Patient will be independent with his HEP.    Time 4    Period Weeks    Status On-going    Target Date 12/30/21      PT LONG TERM GOAL #2   Title Patient will be able to demonstrrate at least 120 degrees of active right knee flexion.    Time 4    Period Weeks    Status On-going    Target Date 12/30/21      PT LONG TERM GOAL #3   Title Patient will be able to demonstrate active right knee extension with 5 degrees of neutral.    Time 4    Period Weeks    Status Achieved    Target Date 12/30/21      PT LONG TERM GOAL #4   Title Patient will be able to safely  ambulate at least 80 feet without an assistive device for improved household mobility.    Time 4    Period Weeks    Status On-going    Target Date 12/30/21                   Plan - 01/04/22 0925     Clinical Impression Statement Patient very motivated and progressing well.  He still lacks extension, therefore, stretching was focused on this today.  He did well with normal modality response following removal of modality.    Personal Factors and Comorbidities Comorbidity 1;Comorbidity 2;Comorbidity 3+    Comorbidities diabetes, HTN, OA    Examination-Activity Limitations Locomotion Level;Transfers;Stairs;Stand;Squat;Carry;Sleep    Examination-Participation Restrictions Yard Work    Stability/Clinical Decision Making Evolving/Moderate complexity    Rehab Potential Excellent    PT Frequency 3x / week    PT Duration 4 weeks    PT Treatment/Interventions ADLs/Self Care Home Management;Cryotherapy;Electrical Stimulation;Moist Heat;Gait training;Stair training;Functional mobility training;Therapeutic activities;Therapeutic exercise;Balance training;Neuromuscular re-education;Manual techniques;Patient/family education;Passive range of motion;Energy conservation;Taping;Vasopneumatic Device    PT Next Visit Plan nustep, PROM, AAROM, and AROM interventions for improved knee mobility    Consulted and Agree with Plan of Care Patient             Patient will benefit from skilled therapeutic intervention in order to improve the following deficits and impairments:  Abnormal gait, Decreased range of motion, Difficulty walking, Decreased activity tolerance, Pain, Decreased balance, Decreased mobility, Decreased strength, Increased edema  Visit Diagnosis: Acute pain of right knee  Stiffness of right knee, not elsewhere classified     Problem List Patient Active Problem List   Diagnosis Date Noted   Primary osteoarthritis of right knee 11/29/2021   Hypertension 05/03/2021   OA  (osteoarthritis) of knee 03/08/2021   Primary osteoarthritis of left knee 03/08/2021   Abdominal aortic ectasia (HCC) 12/31/2020   Constipation 12/31/2020   Idiopathic gout 12/31/2020   Mixed hyperlipidemia 12/31/2020   Morbid obesity ([place]) 12/31/2020   Personal history of colonic polyps 12/31/2020   Polyneuropathy in  diseases classified elsewhere ([place]) 12/31/2020   Type 2 diabetes mellitus with other circulatory complications ([place]) 53/91/2258   Diabetes mellitus with coincident hypertension ([place]) 09/16/2019   Coronary artery disease involving native coronary artery of native heart without angina pectoris 09/16/2019   Pure hypercholesterolemia 09/16/2019   Incarcerated umbilical hernia 34/62/1947    Sakeenah Valcarcel, [place], PT 01/04/2022, 9:37 AM  [facility] [place], [place], [address] Phone: [phone]   Fax:  [phone]  Name: Bluford Sedler MRN: [id number] Date of Birth: [birth year]

## 2022-01-06 ENCOUNTER — Other Ambulatory Visit: Payer: Self-pay

## 2022-01-06 ENCOUNTER — Ambulatory Visit: Payer: Medicare HMO | Admitting: *Deleted

## 2022-01-06 DIAGNOSIS — M25661 Stiffness of right knee, not elsewhere classified: Secondary | ICD-10-CM | POA: Diagnosis not present

## 2022-01-06 DIAGNOSIS — M25561 Pain in right knee: Secondary | ICD-10-CM

## 2022-01-06 NOTE — Therapy (Signed)
[facility] [place], [place], [address] Phone: [phone]   Fax:  [phone]  Physical Therapy Treatment  Patient Details  Name: John Wilson MRN: [id number] Date of Birth: 1946/01/27 Referring Provider (PT): Maureen Ralphs, MD   Encounter Date: 01/06/2022   PT End of Session - 01/06/22 0832     Visit Number 15    Number of Visits 20    Date for PT Re-Evaluation 02/04/22    PT Start Time 0815    PT Stop Time 0910    PT Time Calculation (min) 55 min             Past Medical History:  Diagnosis Date   Arthritis    CAD (coronary artery disease)    Diabetes ([place])    ED (erectile dysfunction)    Gout    Heart murmur    Hyperlipidemia    Hypertension    Obesity    Vertigo     Past Surgical History:  Procedure Laterality Date   [place]  2001   2 separate surgeries, 1 month apart   HERNIA REPAIR  [id number]   Umbilical   LEFT HEART CATH AND CORONARY ANGIOGRAPHY N/A 03/16/2020   Procedure: LEFT HEART CATH AND CORONARY ANGIOGRAPHY;  Surgeon: Troy Sine, MD;  Location: [facility];  Service: Cardiovascular;  Laterality: N/A;   PILONIDAL CYST EXCISION  1979   TOTAL KNEE ARTHROPLASTY Left 03/08/2021   Procedure: TOTAL KNEE ARTHROPLASTY;  Surgeon: Gaynelle Arabian, MD;  Location: [facility];  Service: Orthopedics;  Laterality: Left;  57min   TOTAL KNEE ARTHROPLASTY Right 11/29/2021   Procedure: TOTAL KNEE ARTHROPLASTY;  Surgeon: Gaynelle Arabian, MD;  Location: [facility];  Service: Orthopedics;  Laterality: Right;    There were no vitals filed for this visit.   Subjective Assessment - 01/06/22 0825     Subjective COVID-19 screen performed prior to patient entering clinic. To MD tomorrow. Very sore around the knee and in back    Pertinent History diabetes, HTN, OA    Limitations Walking    Currently in Pain? Yes    Pain Score 4     Pain Location Knee    Pain  Orientation Right    Pain Descriptors / Indicators Discomfort    Pain Type Surgical pain    Pain Onset More than a month ago                               [facility] Adult PT Treatment/Exercise - 01/06/22 0001       Exercises   Exercises Knee/Hip      Knee/Hip Exercises: Aerobic   Recumbent Bike Seat 11 x 5 minutes.    Nustep Level 5 x 10 minutes moving seat forward as tolerated to increase knee flexion.      Modalities   Modalities Psychologist, educational Location RT knee    Psychologist, forensic Parameters 2 chs 40/195 for swelling x 30 mins    Electrical Stimulation Goals Edema      Vasopneumatic   Number Minutes Vasopneumatic  15 minutes    Vasopnuematic Location  --   Right knee.   Vasopneumatic Pressure Low    Vasopneumatic Temperature  34 for edema      Manual Therapy   Manual  Therapy Passive ROM;Soft tissue mobilization    Soft tissue mobilization STW to distal quad for TPR and incision                          PT Long Term Goals - 12/20/21 0942       PT LONG TERM GOAL #1   Title Patient will be independent with his HEP.    Time 4    Period Weeks    Status On-going    Target Date 12/30/21      PT LONG TERM GOAL #2   Title Patient will be able to demonstrrate at least 120 degrees of active right knee flexion.    Time 4    Period Weeks    Status On-going    Target Date 12/30/21      PT LONG TERM GOAL #3   Title Patient will be able to demonstrate active right knee extension with 5 degrees of neutral.    Time 4    Period Weeks    Status Achieved    Target Date 12/30/21      PT LONG TERM GOAL #4   Title Patient will be able to safely ambulate at least 80 feet without an assistive device for improved household mobility.    Time 4    Period Weeks    Status On-going    Target Date 12/30/21                    Plan - 01/06/22 7564     Clinical Impression Statement Pt arrived today doing about the same for tightness in RT knee. Today focused on ROM and decreasing swelling. His ROM has been 5-108 degrees. STW was also performed to distal quad trigger points and scar    Personal Factors and Comorbidities Comorbidity 1;Comorbidity 2;Comorbidity 3+    Comorbidities diabetes, HTN, OA    Examination-Activity Limitations Locomotion Level;Transfers;Stairs;Stand;Squat;Carry;Sleep    Stability/Clinical Decision Making Evolving/Moderate complexity    Rehab Potential Excellent    PT Frequency 3x / week    PT Duration 4 weeks    PT Treatment/Interventions ADLs/Self Care Home Management;Cryotherapy;Electrical Stimulation;Moist Heat;Gait training;Stair training;Functional mobility training;Therapeutic activities;Therapeutic exercise;Balance training;Neuromuscular re-education;Manual techniques;Patient/family education;Passive range of motion;Energy conservation;Taping;Vasopneumatic Device    PT Next Visit Plan nustep, PROM, AAROM, and AROM interventions for improved knee mobility             Patient will benefit from skilled therapeutic intervention in order to improve the following deficits and impairments:  Abnormal gait, Decreased range of motion, Difficulty walking, Decreased activity tolerance, Pain, Decreased balance, Decreased mobility, Decreased strength, Increased edema  Visit Diagnosis: Acute pain of right knee  Stiffness of right knee, not elsewhere classified     Problem List Patient Active Problem List   Diagnosis Date Noted   Primary osteoarthritis of right knee 11/29/2021   Hypertension 05/03/2021   OA (osteoarthritis) of knee 03/08/2021   Primary osteoarthritis of left knee 03/08/2021   Abdominal aortic ectasia (HCC) 12/31/2020   Constipation 12/31/2020   Idiopathic gout 12/31/2020   Mixed hyperlipidemia 12/31/2020   Morbid obesity ([place]) 12/31/2020   Personal  history of colonic polyps 12/31/2020   Polyneuropathy in diseases classified elsewhere ([place]) 12/31/2020   Type 2 diabetes mellitus with other circulatory complications ([place]) 33/29/5188   Diabetes mellitus with coincident hypertension ([place]) 09/16/2019   Coronary artery disease involving native coronary artery of native heart without angina pectoris 09/16/2019   Pure hypercholesterolemia  09/16/2019   Incarcerated umbilical hernia 37/08/6268    Livi Mcgann,CHRIS, PTA 01/06/2022, 10:50 AM  [facility] [place], [place], [address] Phone: [phone]   Fax:  [phone]  Name: Sante Biedermann MRN: [id number] Date of Birth: 1946/04/26  Progress Note Reporting Period 12/02/21 to 01/06/22  See note below for Objective Data and Assessment of Progress/Goals.   Patient is making good progress with physical therapy as evidenced by his improvements in is right knee AROM. However, he continues to report right knee tightness and swelling.   Jacqulynn Cadet, PT, DPT

## 2022-01-07 DIAGNOSIS — Z4789 Encounter for other orthopedic aftercare: Secondary | ICD-10-CM | POA: Diagnosis not present

## 2022-01-11 ENCOUNTER — Other Ambulatory Visit: Payer: Self-pay

## 2022-01-11 ENCOUNTER — Ambulatory Visit: Payer: Medicare HMO | Admitting: *Deleted

## 2022-01-11 DIAGNOSIS — M25561 Pain in right knee: Secondary | ICD-10-CM

## 2022-01-11 DIAGNOSIS — M25661 Stiffness of right knee, not elsewhere classified: Secondary | ICD-10-CM

## 2022-01-11 NOTE — Therapy (Signed)
[facility] [place], [place], [address] Phone: [phone]   Fax:  [phone]  Physical Therapy Treatment  Patient Details  Name: John Wilson MRN: [id number] Date of Birth: [birth year] Referring Provider (PT): Maureen Ralphs, MD   Encounter Date: 01/11/2022   PT End of Session - 01/11/22 0837     Visit Number 16    Number of Visits 20    Date for PT Re-Evaluation 02/04/22    PT Start Time 1856    PT Stop Time 0911    PT Time Calculation (min) 54 min             Past Medical History:  Diagnosis Date   Arthritis    CAD (coronary artery disease)    Diabetes ([place])    ED (erectile dysfunction)    Gout    Heart murmur    Hyperlipidemia    Hypertension    Obesity    Vertigo     Past Surgical History:  Procedure Laterality Date   [place]  2001   2 separate surgeries, 1 month apart   HERNIA REPAIR  3149   Umbilical   LEFT HEART CATH AND CORONARY ANGIOGRAPHY N/A 03/16/2020   Procedure: LEFT HEART CATH AND CORONARY ANGIOGRAPHY;  Surgeon: Troy Sine, MD;  Location: [facility];  Service: Cardiovascular;  Laterality: N/A;   PILONIDAL CYST EXCISION  1979   TOTAL KNEE ARTHROPLASTY Left 03/08/2021   Procedure: TOTAL KNEE ARTHROPLASTY;  Surgeon: Gaynelle Arabian, MD;  Location: [facility];  Service: Orthopedics;  Laterality: Left;  49min   TOTAL KNEE ARTHROPLASTY Right 11/29/2021   Procedure: TOTAL KNEE ARTHROPLASTY;  Surgeon: Gaynelle Arabian, MD;  [facility]: [facility];  Service: Orthopedics;  Laterality: Right;    There were no vitals filed for this visit.   Subjective Assessment - 01/11/22 0836     Subjective COVID-19 screen performed prior to patient entering clinic.MD pleased with status, but will get mm relaxors for mm tightness    Pertinent History diabetes, HTN, OA    Patient Stated Goals walking without an assistive device, hunt, fish,    Currently in  Pain? Yes    Pain Score 4     Pain Location Knee    Pain Orientation Right                               [facility] Adult PT Treatment/Exercise - 01/11/22 0001       Exercises   Exercises Knee/Hip      Knee/Hip Exercises: Aerobic   Recumbent Bike Seat 11 x 5 minutes.    Nustep Level 5 x 10 minutes moving seat forward as tolerated to increase knee flexion.      Knee/Hip Exercises: Standing   Forward Lunges Right;20 reps    Forward Lunges Limitations 14" step    Rocker Board 3 minutes      Modalities   Modalities Electrical Stimulation;Vasopneumatic      Electrical Stimulation   Electrical Stimulation Location RT knee    Electrical Stimulation Action microcurrent    Electrical Stimulation Parameters 2chs 40/145freq and intensity 100 x 15 mins    Electrical Stimulation Goals Edema      Vasopneumatic   Number Minutes Vasopneumatic  15 minutes    Vasopnuematic Location  --   Right knee.   Vasopneumatic Pressure Low    Vasopneumatic Temperature  34  for edema      Manual Therapy   Manual Therapy Passive ROM;Soft tissue mobilization    Soft tissue mobilization STW to distal quad for TPR and incision, STW to posterior aspect                          PT Long Term Goals - 12/20/21 0942       PT LONG TERM GOAL #1   Title Patient will be independent with his HEP.    Time 4    Period Weeks    Status On-going    Target Date 12/30/21      PT LONG TERM GOAL #2   Title Patient will be able to demonstrrate at least 120 degrees of active right knee flexion.    Time 4    Period Weeks    Status On-going    Target Date 12/30/21      PT LONG TERM GOAL #3   Title Patient will be able to demonstrate active right knee extension with 5 degrees of neutral.    Time 4    Period Weeks    Status Achieved    Target Date 12/30/21      PT LONG TERM GOAL #4   Title Patient will be able to safely ambulate at least 80 feet without an assistive device for  improved household mobility.    Time 4    Period Weeks    Status On-going    Target Date 12/30/21                   Plan - 01/11/22 2025     Clinical Impression Statement Pt arrived today doing fairly well and reports MD pleased with progress. Rx focused on ROM and STW to TPs distal quad and posterior tissues. PROM today 112 degrees.Vaso and microcurrent for swellin.    Personal Factors and Comorbidities Comorbidity 1;Comorbidity 2;Comorbidity 3+    Comorbidities diabetes, HTN, OA    Examination-Activity Limitations Locomotion Level;Transfers;Stairs;Stand;Squat;Carry;Sleep    Examination-Participation Restrictions Yard Work    Rehab Potential Excellent    PT Frequency 3x / week    PT Duration 4 weeks    PT Treatment/Interventions ADLs/Self Care Home Management;Cryotherapy;Electrical Stimulation;Moist Heat;Gait training;Stair training;Functional mobility training;Therapeutic activities;Therapeutic exercise;Balance training;Neuromuscular re-education;Manual techniques;Patient/family education;Passive range of motion;Energy conservation;Taping;Vasopneumatic Device    PT Next Visit Plan Cont with ROM progression and strengthening    Consulted and Agree with Plan of Care Patient             Patient will benefit from skilled therapeutic intervention in order to improve the following deficits and impairments:  Abnormal gait, Decreased range of motion, Difficulty walking, Decreased activity tolerance, Pain, Decreased balance, Decreased mobility, Decreased strength, Increased edema  Visit Diagnosis: Acute pain of right knee  Stiffness of right knee, not elsewhere classified     Problem List Patient Active Problem List   Diagnosis Date Noted   Primary osteoarthritis of right knee 11/29/2021   Hypertension 05/03/2021   OA (osteoarthritis) of knee 03/08/2021   Primary osteoarthritis of left knee 03/08/2021   Abdominal aortic ectasia (HCC) 12/31/2020   Constipation  12/31/2020   Idiopathic gout 12/31/2020   Mixed hyperlipidemia 12/31/2020   Morbid obesity ([place]) 12/31/2020   Personal history of colonic polyps 12/31/2020   Polyneuropathy in diseases classified elsewhere ([place]) 12/31/2020   Type 2 diabetes mellitus with other circulatory complications ([place]) 42/70/6237   Diabetes mellitus with coincident hypertension ([place]) 09/16/2019  Coronary artery disease involving native coronary artery of native heart without angina pectoris 09/16/2019   Pure hypercholesterolemia 09/16/2019   Incarcerated umbilical hernia 46/95/0722    John Wilson,John Wilson, PTA 01/11/2022, 9:18 AM  [facility] [address] [place], [place], [address] Phone: [phone]   Fax:  [phone]  Name: Bion Todorov MRN: [id number] Date of Birth: 09-28-1946

## 2022-01-13 ENCOUNTER — Other Ambulatory Visit: Payer: Self-pay

## 2022-01-13 ENCOUNTER — Ambulatory Visit: Payer: Medicare HMO | Attending: Orthopedic Surgery | Admitting: *Deleted

## 2022-01-13 DIAGNOSIS — M25561 Pain in right knee: Secondary | ICD-10-CM | POA: Diagnosis not present

## 2022-01-13 DIAGNOSIS — M25661 Stiffness of right knee, not elsewhere classified: Secondary | ICD-10-CM | POA: Insufficient documentation

## 2022-01-13 NOTE — Therapy (Signed)
[facility] [facility], [place], [address] ?Phone: [phone]   Fax:  [phone] ? ?Physical Therapy Treatment ? ?Patient Details  ?Name: John Wilson ?MRN: [id number] ?Date of Birth: 23-Jan-1946 ?Referring Provider (PT): Maureen Ralphs, MD ? ? ?Encounter Date: 01/13/2022 ? ? PT End of Session - 01/13/22 6644   ? ? Visit Number 17   ? Number of Visits 20   ? Date for PT Re-Evaluation 02/04/22   ? PT Start Time 0818   ? PT Stop Time 0917   ? PT Time Calculation (min) 59 min   ? ?  ?  ? ?  ? ? ?Past Medical History:  ?Diagnosis Date  ? Arthritis   ? CAD (coronary artery disease)   ? Diabetes ([place])   ? ED (erectile dysfunction)   ? Gout   ? Heart murmur   ? Hyperlipidemia   ? Hypertension   ? Obesity   ? Vertigo   ? ? ?Past Surgical History:  ?Procedure Laterality Date  ? CHOLECYSTECTOMY  1981  ? CORONARY ANGIOPLASTY WITH STENT PLACEMENT  2001  ? 2 separate surgeries, 1 month apart  ? HERNIA REPAIR  2015  ? Umbilical  ? LEFT HEART CATH AND CORONARY ANGIOGRAPHY N/A 03/16/2020  ? Procedure: LEFT HEART CATH AND CORONARY ANGIOGRAPHY;  Surgeon: Troy Sine, MD;  Location: [facility];  Service: Cardiovascular;  Laterality: N/A;  ? PILONIDAL CYST EXCISION  1979  ? TOTAL KNEE ARTHROPLASTY Left 03/08/2021  ? Procedure: TOTAL KNEE ARTHROPLASTY;  Surgeon: Gaynelle Arabian, MD;  [facility]: [facility];  Service: Orthopedics;  Laterality: Left;  6min  ? TOTAL KNEE ARTHROPLASTY Right 11/29/2021  ? Procedure: TOTAL KNEE ARTHROPLASTY;  Surgeon: Gaynelle Arabian, MD;  [facility];  Service: Orthopedics;  Laterality: Right;  ? ? ?There were no vitals filed for this visit. ? ? Subjective Assessment - 01/13/22 0826   ? ? Subjective COVID-19 screen performed prior to patient entering clinic. Taking MM relaxer now. I think the bike is agravating the outside of my knee   ? Pertinent History diabetes, HTN, OA   ? Limitations Walking   ? Patient Stated Goals walking without an assistive  device, hunt, fish,   ? Currently in Pain? Yes   ? Pain Score 4    ? Pain Location Knee   ? Pain Orientation Right   ? Pain Descriptors / Indicators Discomfort   ? Pain Onset More than a month ago   ? ?  ?  ? ?  ? ? ? ? ? ? ? ? ? ? ? ? ? ? ? ? ? ? ? ? [facility] Adult PT Treatment/Exercise - 01/13/22 0001   ? ?  ? Exercises  ? Exercises Knee/Hip   ?  ? Knee/Hip Exercises: Aerobic  ? Nustep Level 5 x 15 minutes moving seat forward as tolerated to increase knee flexion.seat 9,8,7   ?  ? Knee/Hip Exercises: Standing  ? Forward Lunges Right;20 reps   ? Forward Lunges Limitations 14" step   ? Rocker Board 3 minutes   ?  ? Modalities  ? Modalities Electrical Stimulation;Vasopneumatic   ?  ? Electrical Stimulation  ? Electrical Stimulation Location RT knee   ? Electrical Stimulation Action microcurrent   ? Electrical Stimulation Parameters 2chs 40/195 x15 mins Intensity 100   ?  ? Vasopneumatic  ? Number Minutes Vasopneumatic  15 minutes   ? Vasopnuematic Location  Knee   ? Vasopneumatic Pressure Low   ?  Vasopneumatic Temperature  34 for edema   ?  ? Manual Therapy  ? Manual Therapy Passive ROM;Soft tissue mobilization   ? Soft tissue mobilization STW to distal quad for TPR and incision, STW to posterior aspect   ? ?  ?  ? ?  ? ? ? ? ? ? ? ? ? ? ? ? ? ? ? PT Long Term Goals - 12/20/21 0942   ? ?  ? PT LONG TERM GOAL #1  ? Title Patient will be independent with his HEP.   ? Time 4   ? Period Weeks   ? Status On-going   ? Target Date 12/30/21   ?  ? PT LONG TERM GOAL #2  ? Title Patient will be able to demonstrrate at least 120 degrees of active right knee flexion.   ? Time 4   ? Period Weeks   ? Status On-going   ? Target Date 12/30/21   ?  ? PT LONG TERM GOAL #3  ? Title Patient will be able to demonstrate active right knee extension with 5 degrees of neutral.   ? Time 4   ? Period Weeks   ? Status Achieved   ? Target Date 12/30/21   ?  ? PT LONG TERM GOAL #4  ? Title Patient will be able to safely ambulate at least 80 feet  without an assistive device for improved household mobility.   ? Time 4   ? Period Weeks   ? Status On-going   ? Target Date 12/30/21   ? ?  ?  ? ?  ? ? ? ? ? ? ? ? Plan - 01/13/22 0831   ? ? Clinical Impression Statement Pt arrived today doing some better, but reports increased pain lateral aspect RT  knee after riding the bike the last few Rxs. He did well with todays Rx without increasedLateral pain ITB area.   ? Personal Factors and Comorbidities Comorbidity 1;Comorbidity 2;Comorbidity 3+   ? Comorbidities diabetes, HTN, OA   ? Examination-Activity Limitations Locomotion Level;Transfers;Stairs;Stand;Squat;Carry;Sleep   ? Rehab Potential Excellent   ? PT Frequency 3x / week   ? PT Duration 4 weeks   ? PT Treatment/Interventions ADLs/Self Care Home Management;Cryotherapy;Electrical Stimulation;Moist Heat;Gait training;Stair training;Functional mobility training;Therapeutic activities;Therapeutic exercise;Balance training;Neuromuscular re-education;Manual techniques;Patient/family education;Passive range of motion;Energy conservation;Taping;Vasopneumatic Device   ? PT Next Visit Plan Cont with ROM progression and strengthening   ? Consulted and Agree with Plan of Care Patient   ? ?  ?  ? ?  ? ? ?Patient will benefit from skilled therapeutic intervention in order to improve the following deficits and impairments:  Abnormal gait, Decreased range of motion, Difficulty walking, Decreased activity tolerance, Pain, Decreased balance, Decreased mobility, Decreased strength, Increased edema ? ?Visit Diagnosis: ?Acute pain of right knee ? ?Stiffness of right knee, not elsewhere classified ? ? ? ? ?Problem List ?Patient Active Problem List  ? Diagnosis Date Noted  ? Primary osteoarthritis of right knee 11/29/2021  ? Hypertension 05/03/2021  ? OA (osteoarthritis) of knee 03/08/2021  ? Primary osteoarthritis of left knee 03/08/2021  ? Abdominal aortic ectasia ([place]) 12/31/2020  ? Constipation 12/31/2020  ? Idiopathic gout  12/31/2020  ? Mixed hyperlipidemia 12/31/2020  ? Morbid obesity ([place]) 12/31/2020  ? Personal history of colonic polyps 12/31/2020  ? Polyneuropathy in diseases classified elsewhere ([place]) 12/31/2020  ? Type 2 diabetes mellitus with other circulatory complications ([place]) 82/99/3716  ? Diabetes mellitus with coincident hypertension ([place]) 09/16/2019  ? Coronary  artery disease involving native coronary artery of native heart without angina pectoris 09/16/2019  ? Pure hypercholesterolemia 09/16/2019  ? Incarcerated umbilical hernia 92/44/6286  ? ? ?Sopheap Basic,CHRIS, PTA ?01/13/2022, 10:17 AM ? ?[facility], [place], [address] ?Phone: [phone]   Fax:  [phone] ? ?Name: Stark Aguinaga ?MRN: [id number] ?Date of Birth: 12-17-1945 ? ? ? ?

## 2022-01-18 ENCOUNTER — Other Ambulatory Visit: Payer: Self-pay

## 2022-01-18 ENCOUNTER — Ambulatory Visit: Payer: Medicare HMO | Admitting: Physical Therapy

## 2022-01-18 DIAGNOSIS — M25661 Stiffness of right knee, not elsewhere classified: Secondary | ICD-10-CM | POA: Diagnosis not present

## 2022-01-18 DIAGNOSIS — M25561 Pain in right knee: Secondary | ICD-10-CM

## 2022-01-18 NOTE — Therapy (Signed)
[place] ?[facility] ?[address] ?[place], [place], [address] ?Phone: [phone]   Fax:  [phone] ? ?Physical Therapy Treatment ? ?Patient Details  ?Name: John Wilson ?MRN: [id number] ?Date of Birth: [birth year] ?Referring Provider (PT): Maureen Ralphs, MD ? ? ?Encounter Date: 01/18/2022 ? ? PT End of Session - 01/18/22 1002   ? ? Visit Number 18   ? Number of Visits 20   ? Date for PT Re-Evaluation 02/04/22   ? PT Start Time 0815   ? PT Stop Time 6606   ? PT Time Calculation (min) 53 min   ? Activity Tolerance Patient tolerated treatment well   ? Behavior During Therapy [facility] for tasks assessed/performed   ? ?  ?  ? ?  ? ? ?Past Medical History:  ?Diagnosis Date  ? Arthritis   ? CAD (coronary artery disease)   ? Diabetes ([place])   ? ED (erectile dysfunction)   ? Gout   ? Heart murmur   ? Hyperlipidemia   ? Hypertension   ? Obesity   ? Vertigo   ? ? ?Past Surgical History:  ?Procedure Laterality Date  ? CHOLECYSTECTOMY  1981  ? CORONARY ANGIOPLASTY WITH STENT PLACEMENT  2001  ? 2 separate surgeries, 1 month apart  ? HERNIA REPAIR  2015  ? Umbilical  ? LEFT HEART CATH AND CORONARY ANGIOGRAPHY N/A 03/16/2020  ? Procedure: LEFT HEART CATH AND CORONARY ANGIOGRAPHY;  Surgeon: Troy Sine, MD;  Location: [place] [facility];  Service: Cardiovascular;  Laterality: N/A;  ? PILONIDAL CYST EXCISION  1979  ? TOTAL KNEE ARTHROPLASTY Left 03/08/2021  ? Procedure: TOTAL KNEE ARTHROPLASTY;  Surgeon: Gaynelle Arabian, MD;  [facility]: [facility];  Service: Orthopedics;  Laterality: Left;  72mn  ? TOTAL KNEE ARTHROPLASTY Right 11/29/2021  ? Procedure: TOTAL KNEE ARTHROPLASTY;  Surgeon: AGaynelle Arabian MD;  [facility]: [facility];  Service: Orthopedics;  Laterality: Right;  ? ? ?There were no vitals filed for this visit. ? ? Subjective Assessment - 01/18/22 0905   ? ? Subjective COVID-19 screen performed prior to patient entering clinic.  Doing well.   ? Pertinent History diabetes, HTN, OA   ? Limitations Walking   ?  Patient Stated Goals walking without an assistive device, hunt, fish,   ? Currently in Pain? Yes   ? Pain Score 4    ? Pain Location Knee   ? Pain Orientation Right   ? Pain Descriptors / Indicators Discomfort   ? Pain Type Surgical pain   ? Pain Onset More than a month ago   ? ?  ?  ? ?  ? ? ? ? ? ? ? ? ? ? ? ? ? ? ? ? ? ? ? ? [place] PT Treatment/Exercise - 01/18/22 0001   ? ?  ? Exercises  ? Exercises Knee/Hip   ?  ? Knee/Hip Exercises: Aerobic  ? Nustep Level 5 x 15 minutes.   ?  ? Knee/Hip Exercises: Machines for Strengthening  ? Cybex Knee Extension 10# x 3 minutes.   ? Cybex Knee Flexion 30# x 3 minutes.   ? Cybex Leg Press 3 plates x 3 minutes.   ?  ? Modalities  ? Modalities Electrical Stimulation;Vasopneumatic   ?  ? Electrical Stimulation  ? Electrical Stimulation Location Right knee.   ? Electrical Stimulation Action Pre-mod.   ? Electrical Stimulation Parameters 80-150 Hz x 15 minutes.   ? Electrical Stimulation Goals Edema;Pain   ?  ? Vasopneumatic  ?  Number Minutes Vasopneumatic  15 minutes   ? Vasopnuematic Location  --   Right knee.  ? Vasopneumatic Pressure Low   ?  ? Manual Therapy  ? Manual Therapy Passive ROM   ? Passive ROM In supine:  PROM x 6 minutes into right knee flexion and extension.   ? ?  ?  ? ?  ? ? ? ? ? ? ? ? ? ? ? ? ? ? ? PT Long Term Goals - 12/20/21 0942   ? ?  ? PT LONG TERM GOAL #1  ? Title Patient will be independent with his HEP.   ? Time 4   ? Period Weeks   ? Status On-going   ? Target Date 12/30/21   ?  ? PT LONG TERM GOAL #2  ? Title Patient will be able to demonstrrate at least 120 degrees of active right knee flexion.   ? Time 4   ? Period Weeks   ? Status On-going   ? Target Date 12/30/21   ?  ? PT LONG TERM GOAL #3  ? Title Patient will be able to demonstrate active right knee extension with 5 degrees of neutral.   ? Time 4   ? Period Weeks   ? Status Achieved   ? Target Date 12/30/21   ?  ? PT LONG TERM GOAL #4  ? Title Patient will be able to safely ambulate at  least 80 feet without an assistive device for improved household mobility.   ? Time 4   ? Period Weeks   ? Status On-going   ? Target Date 12/30/21   ? ?  ?  ? ?  ? ? ? ? ? ? ? ? Plan - 01/18/22 1003   ? ? Clinical Impression Statement Patient doing well and is very pleased with his progress.  He is now walking independently without an assisitve device.   ? Personal Factors and Comorbidities Comorbidity 1;Comorbidity 2;Comorbidity 3+   ? Comorbidities diabetes, HTN, OA   ? Examination-Activity Limitations Locomotion Level;Transfers;Stairs;Stand;Squat;Carry;Sleep   ? Examination-Participation Restrictions V[place] Leaver Work   ? Stability/Clinical Decision Making Evolving/Moderate complexity   ? Rehab Potential Excellent   ? PT Frequency 3x / week   ? PT Duration 4 weeks   ? PT Treatment/Interventions ADLs/Self Care Home Management;Cryotherapy;Electrical Stimulation;Moist Heat;Gait training;Stair training;Functional mobility training;Therapeutic activities;Therapeutic exercise;Balance training;Neuromuscular re-education;Manual techniques;Patient/family education;Passive range of motion;Energy conservation;Taping;Vasopneumatic Device   ? PT Next Visit Plan Cont with ROM progression and strengthening   ? Consulted and Agree with Plan of Care Patient   ? ?  ?  ? ?  ? ? ?Patient will benefit from skilled therapeutic intervention in order to improve the following deficits and impairments:  Abnormal gait, Decreased range of motion, Difficulty walking, Decreased activity tolerance, Pain, Decreased balance, Decreased mobility, Decreased strength, Increased edema ? ?Visit Diagnosis: ?Acute pain of right knee ? ?Stiffness of right knee, not elsewhere classified ? ? ? ? ?Problem List ?Patient Active Problem List  ? Diagnosis Date Noted  ? Primary osteoarthritis of right knee 11/29/2021  ? Hypertension 05/03/2021  ? OA (osteoarthritis) of knee 03/08/2021  ? Primary osteoarthritis of left knee 03/08/2021  ? Abdominal aortic ectasia ([place])  12/31/2020  ? Constipation 12/31/2020  ? Idiopathic gout 12/31/2020  ? Mixed hyperlipidemia 12/31/2020  ? Morbid obesity ([place]) 12/31/2020  ? Personal history of colonic polyps 12/31/2020  ? Polyneuropathy in diseases classified elsewhere ([place]) 12/31/2020  ? Type 2 diabetes mellitus with other circulatory  complications ([place]) 32/99/2426  ? Diabetes mellitus with coincident hypertension ([place]) 09/16/2019  ? Coronary artery disease involving native coronary artery of native heart without angina pectoris 09/16/2019  ? Pure hypercholesterolemia 09/16/2019  ? Incarcerated umbilical hernia 83/41/9622  ? ? ?Crisanto Nied, [place], PT ?01/18/2022, 10:05 AM ? ?[place] ?[facility] ?[address] ?[place], [place], [address] ?Phone: [phone]   Fax:  [phone] ? ?Name: John Wilson ?MRN: [id number] ?Date of Birth: 09-Apr-1946 ? ? ? ?

## 2022-01-19 DIAGNOSIS — I10 Essential (primary) hypertension: Secondary | ICD-10-CM | POA: Diagnosis not present

## 2022-01-19 DIAGNOSIS — E119 Type 2 diabetes mellitus without complications: Secondary | ICD-10-CM | POA: Diagnosis not present

## 2022-01-20 ENCOUNTER — Other Ambulatory Visit: Payer: Self-pay

## 2022-01-20 ENCOUNTER — Ambulatory Visit: Payer: Medicare HMO | Admitting: *Deleted

## 2022-01-20 DIAGNOSIS — M25661 Stiffness of right knee, not elsewhere classified: Secondary | ICD-10-CM | POA: Diagnosis not present

## 2022-01-20 DIAGNOSIS — M25561 Pain in right knee: Secondary | ICD-10-CM

## 2022-01-20 NOTE — Therapy (Signed)
[place] ?[facility] ?[address] ?[place], [place], [address] ?Phone: [phone]   Fax:  [phone] ? ?Physical Therapy Treatment ? ?Patient Details  ?Name: John Wilson ?MRN: [id number] ?Date of Birth: [birth year] ?Referring Provider (PT): Maureen Ralphs, MD ? ? ?Encounter Date: 01/20/2022 ? ? PT End of Session - 01/20/22 2951   ? ? Visit Number 19   ? Number of Visits 20   ? Date for PT Re-Evaluation 02/04/22   ? PT Start Time 0815   ? PT Stop Time 0911   ? PT Time Calculation (min) 56 min   ? ?  ?  ? ?  ? ? ?Past Medical History:  ?Diagnosis Date  ? Arthritis   ? CAD (coronary artery disease)   ? Diabetes ([place])   ? ED (erectile dysfunction)   ? Gout   ? Heart murmur   ? Hyperlipidemia   ? Hypertension   ? Obesity   ? Vertigo   ? ? ?Past Surgical History:  ?Procedure Laterality Date  ? CHOLECYSTECTOMY  1981  ? CORONARY ANGIOPLASTY WITH STENT PLACEMENT  2001  ? 2 separate surgeries, 1 month apart  ? HERNIA REPAIR  2015  ? Umbilical  ? LEFT HEART CATH AND CORONARY ANGIOGRAPHY N/A 03/16/2020  ? Procedure: LEFT HEART CATH AND CORONARY ANGIOGRAPHY;  Surgeon: Troy Sine, MD;  Location: [facility];  Service: Cardiovascular;  Laterality: N/A;  ? PILONIDAL CYST EXCISION  1979  ? TOTAL KNEE ARTHROPLASTY Left 03/08/2021  ? Procedure: TOTAL KNEE ARTHROPLASTY;  Surgeon: Gaynelle Arabian, MD;  [facility];  Service: Orthopedics;  Laterality: Left;  75mn  ? TOTAL KNEE ARTHROPLASTY Right 11/29/2021  ? Procedure: TOTAL KNEE ARTHROPLASTY;  Surgeon: AGaynelle Arabian MD;  [facility];  Service: Orthopedics;  Laterality: Right;  ? ? ?There were no vitals filed for this visit. ? ? Subjective Assessment - 01/20/22 0823   ? ? Subjective COVID-19 screen performed prior to patient entering clinic.  Swelling is starting to go down.   ? Pertinent History diabetes, HTN, OA   ? Limitations Walking   ? ?  ?  ? ?  ? ? ? ? ? ? ? ? ? ? ? ? ? ? ? ? ? ? ? ? [place] PT Treatment/Exercise - 01/20/22 0001    ? ?  ? Exercises  ? Exercises Knee/Hip   ?  ? Knee/Hip Exercises: Aerobic  ? Nustep Level 5 x 15 minutes.   ?  ? Knee/Hip Exercises: Standing  ? Forward Lunges Right;20 reps   hold stretch x 5 secs  ? Forward Lunges Limitations 14" step   ? Rocker Board 4 minutes   calf stretching  ?  ? Modalities  ? Modalities Electrical Stimulation;Vasopneumatic   ?  ? Electrical Stimulation  ? Electrical Stimulation Location Right knee.   ? Electrical Stimulation Action Premod   ? Electrical Stimulation Parameters 80-'150hz'$  x 15 mins   ? Electrical Stimulation Goals Edema;Pain   ?  ? Vasopneumatic  ? Number Minutes Vasopneumatic  15 minutes   ? Vasopnuematic Location  Knee   ? Vasopneumatic Pressure Low   ? Vasopneumatic Temperature  34 for edema   ?  ? Manual Therapy  ? Manual Therapy Passive ROM   ? Passive ROM In supine:  PROM  minutes into right knee extension. and STW to distal quad   ? ?  ?  ? ?  ? ? ? ? ? ? ? ? ? ? ? ? ? ? ?  PT Long Term Goals - 12/20/21 0942   ? ?  ? PT LONG TERM GOAL #1  ? Title Patient will be independent with his HEP.   ? Time 4   ? Period Weeks   ? Status On-going   ? Target Date 12/30/21   ?  ? PT LONG TERM GOAL #2  ? Title Patient will be able to demonstrrate at least 120 degrees of active right knee flexion.   ? Time 4   ? Period Weeks   ? Status On-going   ? Target Date 12/30/21   ?  ? PT LONG TERM GOAL #3  ? Title Patient will be able to demonstrate active right knee extension with 5 degrees of neutral.   ? Time 4   ? Period Weeks   ? Status Achieved   ? Target Date 12/30/21   ?  ? PT LONG TERM GOAL #4  ? Title Patient will be able to safely ambulate at least 80 feet without an assistive device for improved household mobility.   ? Time 4   ? Period Weeks   ? Status On-going   ? Target Date 12/30/21   ? ?  ?  ? ?  ? ? ? ? ? ? ? ? Plan - 01/20/22 0829   ? ? Clinical Impression Statement Pt arrived today doing fairly well now with swelling decreasing and is able to perform all ADL'seasier now. DC  after next visit.   ? Personal Factors and Comorbidities Comorbidity 1;Comorbidity 2;Comorbidity 3+   ? Comorbidities diabetes, HTN, OA   ? Examination-Activity Limitations Locomotion Level;Transfers;Stairs;Stand;Squat;Carry;Sleep   ? Stability/Clinical Decision Making Evolving/Moderate complexity   ? Rehab Potential Excellent   ? PT Frequency 3x / week   ? PT Duration 4 weeks   ? PT Treatment/Interventions ADLs/Self Care Home Management;Cryotherapy;Electrical Stimulation;Moist Heat;Gait training;Stair training;Functional mobility training;Therapeutic activities;Therapeutic exercise;Balance training;Neuromuscular re-education;Manual techniques;Patient/family education;Passive range of motion;Energy conservation;Taping;Vasopneumatic Device   ? PT Next Visit Plan DC after next visit   ? Consulted and Agree with Plan of Care Patient   ? ?  ?  ? ?  ? ? ?Patient will benefit from skilled therapeutic intervention in order to improve the following deficits and impairments:  Abnormal gait, Decreased range of motion, Difficulty walking, Decreased activity tolerance, Pain, Decreased balance, Decreased mobility, Decreased strength, Increased edema ? ?Visit Diagnosis: ?Acute pain of right knee ? ?Stiffness of right knee, not elsewhere classified ? ? ? ? ?Problem List ?Patient Active Problem List  ? Diagnosis Date Noted  ? Primary osteoarthritis of right knee 11/29/2021  ? Hypertension 05/03/2021  ? OA (osteoarthritis) of knee 03/08/2021  ? Primary osteoarthritis of left knee 03/08/2021  ? Abdominal aortic ectasia ([place]) 12/31/2020  ? Constipation 12/31/2020  ? Idiopathic gout 12/31/2020  ? Mixed hyperlipidemia 12/31/2020  ? Morbid obesity ([place]) 12/31/2020  ? Personal history of colonic polyps 12/31/2020  ? Polyneuropathy in diseases classified elsewhere ([place]) 12/31/2020  ? Type 2 diabetes mellitus with other circulatory complications ([place]) 76/28/3151  ? Diabetes mellitus with coincident hypertension ([place]) 09/16/2019  ? Coronary  artery disease involving native coronary artery of native heart without angina pectoris 09/16/2019  ? Pure hypercholesterolemia 09/16/2019  ? Incarcerated umbilical hernia 76/16/0737  ? ? ?Shoshana Johal,CHRIS, PTA ?01/20/2022, 9:18 AM ? ?[place] ?[facility] ?[address] ?[place], [place], [address] ?Phone: [phone]   Fax:  [phone] ? ?Name: John Wilson ?MRN: [id number] ?Date of Birth: 08-21-1946 ? ? ? ?

## 2022-01-21 DIAGNOSIS — E119 Type 2 diabetes mellitus without complications: Secondary | ICD-10-CM | POA: Diagnosis not present

## 2022-01-24 ENCOUNTER — Other Ambulatory Visit: Payer: Self-pay | Admitting: Cardiology

## 2022-01-24 DIAGNOSIS — I10 Essential (primary) hypertension: Secondary | ICD-10-CM

## 2022-01-25 ENCOUNTER — Ambulatory Visit: Payer: Medicare HMO | Admitting: *Deleted

## 2022-01-25 ENCOUNTER — Other Ambulatory Visit: Payer: Self-pay

## 2022-01-25 DIAGNOSIS — M25561 Pain in right knee: Secondary | ICD-10-CM | POA: Diagnosis not present

## 2022-01-25 DIAGNOSIS — M25661 Stiffness of right knee, not elsewhere classified: Secondary | ICD-10-CM | POA: Diagnosis not present

## 2022-01-25 NOTE — Therapy (Signed)
[place] ?[facility] ?[address] ?[place], [place], [address] ?Phone: [phone]   Fax:  [phone] ? ?Physical Therapy Treatment ? ?Patient Details  ?Name: John Wilson ?MRN: [id number] ?Date of Birth: 02-09-1946 ?Referring Provider (PT): Maureen Ralphs, MD ? ? ?Encounter Date: 01/25/2022 ? ? PT End of Session - 01/25/22 0858   ? ? Visit Number 20   ? Number of Visits 20   ? Date for PT Re-Evaluation 02/04/22   ? PT Start Time 0815   ? PT Stop Time 2774   ? PT Time Calculation (min) 57 min   ? ?  ?  ? ?  ? ? ?Past Medical History:  ?Diagnosis Date  ? Arthritis   ? CAD (coronary artery disease)   ? Diabetes ([place])   ? ED (erectile dysfunction)   ? Gout   ? Heart murmur   ? Hyperlipidemia   ? Hypertension   ? Obesity   ? Vertigo   ? ? ?Past Surgical History:  ?Procedure Laterality Date  ? CHOLECYSTECTOMY  1981  ? CORONARY ANGIOPLASTY WITH STENT PLACEMENT  2001  ? 2 separate surgeries, 1 month apart  ? HERNIA REPAIR  2015  ? Umbilical  ? LEFT HEART CATH AND CORONARY ANGIOGRAPHY N/A 03/16/2020  ? Procedure: LEFT HEART CATH AND CORONARY ANGIOGRAPHY;  Surgeon: Troy Sine, MD;  Location: [facility];  Service: Cardiovascular;  Laterality: N/A;  ? PILONIDAL CYST EXCISION  1979  ? TOTAL KNEE ARTHROPLASTY Left 03/08/2021  ? Procedure: TOTAL KNEE ARTHROPLASTY;  Surgeon: Gaynelle Arabian, MD;  [facility];  Service: Orthopedics;  Laterality: Left;  9mn  ? TOTAL KNEE ARTHROPLASTY Right 11/29/2021  ? Procedure: TOTAL KNEE ARTHROPLASTY;  Surgeon: AGaynelle Arabian MD;  [facility];  Service: Orthopedics;  Laterality: Right;  ? ? ?There were no vitals filed for this visit. ? ? Subjective Assessment - 01/25/22 0837   ? ? Subjective COVID-19 screen performed prior to patient entering clinic.  DC to HEP today. Doing good   ? Pertinent History diabetes, HTN, OA   ? Limitations Walking   ? Patient Stated Goals walking without an assistive device, hunt, fish,   ? Currently in Pain? Yes   ?  Pain Score 3    ? Pain Location Knee   ? Pain Orientation Right   ? Pain Descriptors / Indicators Sore   ? Pain Type Surgical pain   ? ?  ?  ? ?  ? ? ? ? ? ? ? ? ? ? ? ? ? ? ? ? ? ? ? ? [place]ult PT Treatment/Exercise - 01/25/22 0001   ? ?  ? Exercises  ? Exercises Knee/Hip   ?  ? Knee/Hip Exercises: Aerobic  ? Nustep Level 5 x 15 minutes.   ?  ? Knee/Hip Exercises: Machines for Strengthening  ? Cybex Knee Extension 10# x 3 minutes.   ? Cybex Knee Flexion 30# x 3 minutes.   ?  ? Knee/Hip Exercises: Standing  ? Forward Lunges Right;20 reps   ? Forward Lunges Limitations 14" step   ? Rocker Board 4 minutes   calf stretching  ?  ? Modalities  ? Modalities Electrical Stimulation;Vasopneumatic   ?  ? Electrical Stimulation  ? Electrical Stimulation Location Right knee.   ? Electrical Stimulation Action Premod   ? Electrical Stimulation Parameters 80-150hz  x15 mins   ? Electrical Stimulation Goals Edema;Pain   ?  ? Vasopneumatic  ? Number Minutes Vasopneumatic  15  minutes   ? Vasopnuematic Location  Knee   ? Vasopneumatic Pressure Low   ? Vasopneumatic Temperature  34 for edema   ? ?  ?  ? ?  ? ? ? ? ? ? ? ? ? ? ? ? ? ? ? PT Long Term Goals - 01/25/22 1104   ? ?  ? PT LONG TERM GOAL #1  ? Title Patient will be independent with his HEP.   ? Time 4   ? Period Weeks   ? Status Achieved   ?  ? PT LONG TERM GOAL #2  ? Title Patient will be able to demonstrrate at least 120 degrees of active right knee flexion.   ? Time 4   ? Period Weeks   ? Status Partially Met   115 degrees  ?  ? PT LONG TERM GOAL #3  ? Title Patient will be able to demonstrate active right knee extension with 5 degrees of neutral.   ? Time 4   ? Period Weeks   ? Status Achieved   ?  ? PT LONG TERM GOAL #4  ? Title Patient will be able to safely ambulate at least 80 feet without an assistive device for improved household mobility.   ? Time 4   ? Period Weeks   ? Status Achieved   ?  ? PT LONG TERM GOAL #5  ? Title Increase right hip and knee strength to a  solid 4+/5 to provide good stability for accomplishment of functional activities.   ? Baseline Met 05/04/21   ? Time 4   ? Period Weeks   ? Status Achieved   ? ?  ?  ? ?  ? ? ? ? ? ? ? ? Plan - 01/25/22 1103   ? ? Clinical Impression Statement Pt arrived today doing fairly well with RT knee and was able to perform all therex and is independent in HEP. He was able to meet all LTGs except ROM was at 115 degrees and not 120 degrees. Pt will be DC today to HEP.   ? Personal Factors and Comorbidities Comorbidity 1;Comorbidity 2;Comorbidity 3+   ? Comorbidities diabetes, HTN, OA   ? Examination-Activity Limitations Locomotion Level;Transfers;Stairs;Stand;Squat;Carry;Sleep   ? Stability/Clinical Decision Making Evolving/Moderate complexity   ? Rehab Potential Excellent   ? PT Frequency 3x / week   ? PT Duration 4 weeks   ? PT Treatment/Interventions ADLs/Self Care Home Management;Cryotherapy;Electrical Stimulation;Moist Heat;Gait training;Stair training;Functional mobility training;Therapeutic activities;Therapeutic exercise;Balance training;Neuromuscular re-education;Manual techniques;Patient/family education;Passive range of motion;Energy conservation;Taping;Vasopneumatic Device   ? PT Next Visit Plan DC to HEP   ? ?  ?  ? ?  ? ? ?Patient will benefit from skilled therapeutic intervention in order to improve the following deficits and impairments:  Abnormal gait, Decreased range of motion, Difficulty walking, Decreased activity tolerance, Pain, Decreased balance, Decreased mobility, Decreased strength, Increased edema ? ?Visit Diagnosis: ?Acute pain of right knee ? ?Stiffness of right knee, not elsewhere classified ? ? ? ? ?Problem List ?Patient Active Problem List  ? Diagnosis Date Noted  ? Primary osteoarthritis of right knee 11/29/2021  ? Hypertension 05/03/2021  ? OA (osteoarthritis) of knee 03/08/2021  ? Primary osteoarthritis of left knee 03/08/2021  ? Abdominal aortic ectasia ([place]) 12/31/2020  ? Constipation  12/31/2020  ? Idiopathic gout 12/31/2020  ? Mixed hyperlipidemia 12/31/2020  ? Morbid obesity ([place]) 12/31/2020  ? Personal history of colonic polyps 12/31/2020  ? Polyneuropathy in diseases classified elsewhere ([place]) 12/31/2020  ?  Type 2 diabetes mellitus with other circulatory complications ([place]) 93/81/8299  ? Diabetes mellitus with coincident hypertension ([place]) 09/16/2019  ? Coronary artery disease involving native coronary artery of native heart without angina pectoris 09/16/2019  ? Pure hypercholesterolemia 09/16/2019  ? Incarcerated umbilical hernia 37/16/9678  ? ? ?Magin Balbi,CHRIS, PTA ?01/25/2022, 11:08 AM ? ?[place] ?[facility] ?[address] ?[place], [place], [address] ?Phone: [phone]   Fax:  [phone] ? ?Name: John Wilson ?MRN: [id number] ?Date of Birth: 1946/05/09 ? ?PHYSICAL THERAPY DISCHARGE SUMMARY ? ?Visits from Start of Care: 20 ? ?Current functional level related to goals / functional outcomes: ?Patient was able to meet most of his goals for physical therapy. The only goal he was unable to completely meet was his active knee flexion which was within 5 degrees of his goal.  ?  ?Remaining deficits: ?Right knee flexion  ?  ?Education / Equipment: ?HEP   ? ?Patient agrees to discharge. Patient goals were partially met. Patient is being discharged due to being pleased with the current functional level. ? ?Jacqulynn Cadet, PT, DPT   ? ?

## 2022-02-03 DIAGNOSIS — M47816 Spondylosis without myelopathy or radiculopathy, lumbar region: Secondary | ICD-10-CM | POA: Diagnosis not present

## 2022-03-30 DIAGNOSIS — M47816 Spondylosis without myelopathy or radiculopathy, lumbar region: Secondary | ICD-10-CM | POA: Diagnosis not present

## 2022-06-03 DIAGNOSIS — M47816 Spondylosis without myelopathy or radiculopathy, lumbar region: Secondary | ICD-10-CM | POA: Diagnosis not present

## 2022-06-16 DIAGNOSIS — M47816 Spondylosis without myelopathy or radiculopathy, lumbar region: Secondary | ICD-10-CM | POA: Diagnosis not present

## 2022-06-23 ENCOUNTER — Other Ambulatory Visit: Payer: Self-pay | Admitting: Cardiology

## 2022-08-23 DIAGNOSIS — I10 Essential (primary) hypertension: Secondary | ICD-10-CM | POA: Diagnosis not present

## 2022-08-23 DIAGNOSIS — I77811 Abdominal aortic ectasia: Secondary | ICD-10-CM | POA: Diagnosis not present

## 2022-08-23 DIAGNOSIS — E782 Mixed hyperlipidemia: Secondary | ICD-10-CM | POA: Diagnosis not present

## 2022-08-23 DIAGNOSIS — G63 Polyneuropathy in diseases classified elsewhere: Secondary | ICD-10-CM | POA: Diagnosis not present

## 2022-08-23 DIAGNOSIS — I2510 Atherosclerotic heart disease of native coronary artery without angina pectoris: Secondary | ICD-10-CM | POA: Diagnosis not present

## 2022-08-23 DIAGNOSIS — G479 Sleep disorder, unspecified: Secondary | ICD-10-CM | POA: Diagnosis not present

## 2022-08-23 DIAGNOSIS — Z1211 Encounter for screening for malignant neoplasm of colon: Secondary | ICD-10-CM | POA: Diagnosis not present

## 2022-08-23 DIAGNOSIS — E119 Type 2 diabetes mellitus without complications: Secondary | ICD-10-CM | POA: Diagnosis not present

## 2022-08-26 ENCOUNTER — Other Ambulatory Visit (HOSPITAL_BASED_OUTPATIENT_CLINIC_OR_DEPARTMENT_OTHER): Payer: Self-pay | Admitting: Family Medicine

## 2022-08-26 DIAGNOSIS — I77811 Abdominal aortic ectasia: Secondary | ICD-10-CM

## 2022-08-31 ENCOUNTER — Telehealth (HOSPITAL_BASED_OUTPATIENT_CLINIC_OR_DEPARTMENT_OTHER): Payer: Self-pay

## 2023-01-02 ENCOUNTER — Other Ambulatory Visit: Payer: Self-pay | Admitting: Cardiology

## 2023-01-31 ENCOUNTER — Other Ambulatory Visit: Payer: Self-pay | Admitting: Cardiology

## 2023-02-15 ENCOUNTER — Other Ambulatory Visit: Payer: Self-pay | Admitting: *Deleted

## 2023-02-15 DIAGNOSIS — I10 Essential (primary) hypertension: Secondary | ICD-10-CM

## 2023-02-15 MED ORDER — CHLORTHALIDONE 25 MG PO TABS
25.0000 mg | ORAL_TABLET | Freq: Every day | ORAL | 0 refills | Status: DC
Start: 2023-02-15 — End: 2023-02-22

## 2023-02-15 NOTE — Telephone Encounter (Signed)
Pt calling in today due to running low on Medications.  Pt Called in and has scheduled appt for Dr. Marlou Porch in July.  Whom ever pt t/w stated pt has to see doctor and cannot get in till July.  Made pt appt with Richardson Dopp, PA-C for next week.  Will fill one med today Chlorthalidone # 30 to requested pharmacy.

## 2023-02-21 NOTE — Progress Notes (Signed)
  Cardiology Office Note:    Date:  02/21/2023  ID:  John Wilson, DOB [birth year], MRN [id number] [facility] Providers Cardiologist:  Donato Schultz, MD { Click to update primary MD,subspecialty MD or APP then REFRESH:1}     Patient Profile:   Coronary artery disease  S/p BMS to RCA in 2003 LHC 03/16/20: dRCA 70, dRCA stent patent, OM1 stent patent, LAD, RI, LCx patent, EF 55-65 >> Med Rx, PCI of RCA would be difficult due to tortuosity  Hypertension, labile Hyperlipidemia  Diabetes mellitus     History of Present Illness:   John Wilson is a 77 y.o. male who returns for f/u on CAD. He was last seen in clinic by Jacolyn Reedy, PA-C in Jan 2023. ***  ROS ***    Studies Reviewed:    EKG:  *** ***  Risk Assessment/Calculations:   {Does this patient have ATRIAL FIBRILLATION?:[phone]} No BP recorded.  {Refresh Note OR Click here to enter BP  :1}***       Physical Exam:   VS:  There were no vitals taken for this visit.   Wt Readings from Last 3 Encounters:  11/29/21 250 lb 3.6 oz (113.5 kg)  11/17/21 250 lb 2 oz (113.5 kg)  11/17/21 250 lb 3.2 oz (113.5 kg)    Physical Exam***    ASSESSMENT AND PLAN:   No problem-specific Assessment & Plan notes found for this encounter. ***    {Are you ordering a CV Procedure (e.g. stress test, cath, DCCV, TEE, etc)?   Press F2        :[id number]}  Dispo:  No follow-ups on file. Signed, Tereso Newcomer, PA-C

## 2023-02-22 ENCOUNTER — Encounter: Payer: Self-pay | Admitting: Physician Assistant

## 2023-02-22 ENCOUNTER — Ambulatory Visit: Payer: PPO | Attending: Physician Assistant | Admitting: Physician Assistant

## 2023-02-22 VITALS — BP 110/50 | HR 53 | Ht 70.0 in | Wt 253.4 lb

## 2023-02-22 DIAGNOSIS — I2510 Atherosclerotic heart disease of native coronary artery without angina pectoris: Secondary | ICD-10-CM

## 2023-02-22 DIAGNOSIS — E1159 Type 2 diabetes mellitus with other circulatory complications: Secondary | ICD-10-CM | POA: Diagnosis not present

## 2023-02-22 DIAGNOSIS — I10 Essential (primary) hypertension: Secondary | ICD-10-CM

## 2023-02-22 DIAGNOSIS — E7800 Pure hypercholesterolemia, unspecified: Secondary | ICD-10-CM

## 2023-02-22 MED ORDER — ISOSORBIDE MONONITRATE ER 30 MG PO TB24: 30.0000 mg | ORAL_TABLET | Freq: Every day | ORAL | 3 refills | Status: DC

## 2023-02-22 MED ORDER — ATORVASTATIN CALCIUM 80 MG PO TABS: 80.0000 mg | ORAL_TABLET | Freq: Every day | ORAL | 3 refills | Status: DC

## 2023-02-22 MED ORDER — AMLODIPINE BESYLATE 5 MG PO TABS: 5.0000 mg | ORAL_TABLET | Freq: Every day | ORAL | 3 refills | Status: DC

## 2023-02-22 MED ORDER — CHLORTHALIDONE 25 MG PO TABS
25.0000 mg | ORAL_TABLET | Freq: Every day | ORAL | 3 refills | Status: DC
Start: 2023-02-22 — End: 2024-02-23

## 2023-02-22 NOTE — Patient Instructions (Signed)
Medication Instructions:  Your physician recommends that you continue on your current medications as directed. Please refer to the Current Medication list given to you today.  You can try to change the Chlorthalidone to 1/2 tablet twice a day and if blood pressure not better, you can go back to the 1 tablet a day.  If you change and it works, and you stay on the 1/2 tablet daily, just call us and let us know.   *If you need a refill on your cardiac medications before your next appointment, please call your pharmacy*   Lab Work: None ordered  If you have labs (blood work) drawn today and your tests are completely normal, you will receive your results only by: MyChart Message (if you have MyChart) OR A paper copy in the mail If you have any lab test that is abnormal or we need to change your treatment, we will call you to review the results.   Testing/Procedures: None ordered   Follow-Up: At [facility], you and your health needs are our priority.  As part of our continuing mission to provide you with exceptional heart care, we have created designated Provider Care Teams.  These Care Teams include your primary Cardiologist (physician) and Advanced Practice Providers (APPs -  Physician Assistants and Nurse Practitioners) who all work together to provide you with the care you need, when you need it.  We recommend signing up for the patient portal called "MyChart".  Sign up information is provided on this After Visit Summary.  MyChart is used to connect with patients for Virtual Visits (Telemedicine).  Patients are able to view lab/test results, encounter notes, upcoming appointments, etc.  Non-urgent messages can be sent to your provider as well.   To learn more about what you can do with MyChart, go to [email or web address].    Your next appointment:   1 year(s)  Provider:   Donato Schultz, MD     Other Instructions

## 2023-02-22 NOTE — Assessment & Plan Note (Signed)
Managed by primary care. 

## 2023-02-22 NOTE — Assessment & Plan Note (Signed)
He has had issues with fluctuations in his blood pressure.  Most recently, his primary care doctor changed his losartan to 50 mg twice daily.  His pressure has been more stable since that time.  He still has episodes of low blood pressure that is symptomatic.  I have recommended changing chlorthalidone to 12.5 mg twice daily.  If this does not help his pressure, he can switch back to 25 mg daily.  Continue losartan 50 mg twice daily, amlodipine 5 mg daily, Imdur 30 mg daily, Toprol-XL 50 mg daily.

## 2023-02-22 NOTE — Assessment & Plan Note (Signed)
Hx of PCI to RCA and OM1. Cardiac catheterization in 2021 with patent RCA and OM1 stent. He has dRCA 70 that is tx medically. He is not having anginal symptoms.  Continue amlodipine 5 mg daily, Lipitor 80 mg daily, Imdur 30 mg daily, Toprol-XL 50 mg daily.  Follow-up 1 year.

## 2023-02-22 NOTE — Assessment & Plan Note (Signed)
LDL in October 2023 was above goal.  He notes he was not fasting.  He has fasting labs pending again in several weeks.  If his LDL remains above 70, consider adding ezetimibe.

## 2023-03-09 ENCOUNTER — Other Ambulatory Visit: Payer: Self-pay | Admitting: Family Medicine

## 2023-03-09 DIAGNOSIS — I7789 Other specified disorders of arteries and arterioles: Secondary | ICD-10-CM

## 2023-04-11 ENCOUNTER — Other Ambulatory Visit: Payer: PPO

## 2023-04-12 ENCOUNTER — Other Ambulatory Visit: Payer: Self-pay | Admitting: Cardiology

## 2023-04-12 DIAGNOSIS — I10 Essential (primary) hypertension: Secondary | ICD-10-CM

## 2023-04-17 ENCOUNTER — Telehealth: Payer: Self-pay | Admitting: Physician Assistant

## 2023-04-17 MED ORDER — EZETIMIBE 10 MG PO TABS: 10.0000 mg | ORAL_TABLET | Freq: Every day | ORAL | 3 refills | Status: DC

## 2023-04-17 NOTE — Telephone Encounter (Signed)
Reviewed labs from PCP via [facility] from April. LDL is above goal at 92. Goal LDL is at least < 70 (ideally < 55). PLAN:  Add Ezetimibe (Zetia) 10 mg once daily. Repeat fasting Lipids, LFTs in 3-4 mos. Tereso Newcomer, PA-C    04/17/2023 8:19 AM

## 2023-04-17 NOTE — Telephone Encounter (Signed)
Pt advised John Wilson's recommendations and agrees to have Dr Abigail Miyamoto recheck his labs in 3-4 months... I will send notes to his office and the pt says he is being seen there in a month and will ask about the labs when he is there.

## 2023-04-26 ENCOUNTER — Ambulatory Visit: Payer: PPO

## 2023-04-26 DIAGNOSIS — I7789 Other specified disorders of arteries and arterioles: Secondary | ICD-10-CM

## 2023-05-05 LAB — COMPREHENSIVE METABOLIC PANEL: EGFR: 65

## 2023-05-05 LAB — LAB REPORT - SCANNED: A1c: 6.7

## 2023-06-12 ENCOUNTER — Ambulatory Visit: Payer: Medicare HMO | Admitting: Cardiology

## 2023-07-06 ENCOUNTER — Other Ambulatory Visit: Payer: Self-pay | Admitting: Cardiology

## 2023-07-06 DIAGNOSIS — I10 Essential (primary) hypertension: Secondary | ICD-10-CM

## 2023-07-25 ENCOUNTER — Ambulatory Visit
Admission: RE | Admit: 2023-07-25 | Discharge: 2023-07-25 | Disposition: A | Discharge status: Home / Self Care | Payer: PPO | Source: Ambulatory Visit | Attending: Family Medicine | Admitting: Family Medicine

## 2023-07-25 ENCOUNTER — Other Ambulatory Visit: Payer: Self-pay | Admitting: Family Medicine

## 2023-07-25 DIAGNOSIS — M25551 Pain in right hip: Secondary | ICD-10-CM

## 2024-02-23 ENCOUNTER — Other Ambulatory Visit: Payer: Self-pay | Admitting: Physician Assistant

## 2024-02-23 DIAGNOSIS — I10 Essential (primary) hypertension: Secondary | ICD-10-CM

## 2024-03-23 ENCOUNTER — Other Ambulatory Visit: Payer: Self-pay | Admitting: Physician Assistant

## 2024-03-24 ENCOUNTER — Other Ambulatory Visit: Payer: Self-pay | Admitting: Cardiology

## 2024-03-24 DIAGNOSIS — I10 Essential (primary) hypertension: Secondary | ICD-10-CM

## 2024-04-30 ENCOUNTER — Ambulatory Visit: Admitting: Podiatry

## 2024-04-30 DIAGNOSIS — E1142 Type 2 diabetes mellitus with diabetic polyneuropathy: Secondary | ICD-10-CM | POA: Insufficient documentation

## 2024-04-30 DIAGNOSIS — L03032 Cellulitis of left toe: Secondary | ICD-10-CM

## 2024-04-30 DIAGNOSIS — I209 Angina pectoris, unspecified: Secondary | ICD-10-CM | POA: Insufficient documentation

## 2024-04-30 DIAGNOSIS — E669 Obesity, unspecified: Secondary | ICD-10-CM | POA: Insufficient documentation

## 2024-04-30 DIAGNOSIS — E782 Mixed hyperlipidemia: Secondary | ICD-10-CM | POA: Insufficient documentation

## 2024-04-30 DIAGNOSIS — G479 Sleep disorder, unspecified: Secondary | ICD-10-CM | POA: Insufficient documentation

## 2024-04-30 DIAGNOSIS — M170 Bilateral primary osteoarthritis of knee: Secondary | ICD-10-CM | POA: Insufficient documentation

## 2024-04-30 DIAGNOSIS — L603 Nail dystrophy: Secondary | ICD-10-CM | POA: Diagnosis not present

## 2024-04-30 DIAGNOSIS — N1831 Chronic kidney disease, stage 3a: Secondary | ICD-10-CM | POA: Insufficient documentation

## 2024-04-30 MED ORDER — NEOMYCIN-POLYMYXIN-HC 1 % OT SOLN: OTIC | 1 refills | Status: AC

## 2024-04-30 NOTE — Patient Instructions (Signed)

## 2024-05-01 NOTE — Progress Notes (Signed)
 Subjective:  Patient ID: John Wilson, male    DOB: [birth year],  MRN: [id number] HPI Chief Complaint  Patient presents with   Toe Pain    Hallux left - clipped toenail last week and started to bleed, open wound under nail, cleaning with peroxide, now very tender, still bleeds occasionally   Nail Problem    Toenails bilateral - thick, discolored nails x years, tried multiple products to help, would like to get rid of the fungus   New Patient (Initial Visit)    Est pt 12/2020    78 y.o. male presents with the above complaint.   ROS: Denies fever chills nausea mobic muscle aches pains calf pain back pain chest pain shortness of breath.  Past Medical History:  Diagnosis Date   Arthritis    CAD (coronary artery disease)    Diabetes (HCC)    ED (erectile dysfunction)    Gout    Heart murmur    Hyperlipidemia    Hypertension    Obesity    Vertigo    Past Surgical History:  Procedure Laterality Date   CHOLECYSTECTOMY  1981   CORONARY ANGIOPLASTY WITH STENT PLACEMENT  2001   2 separate surgeries, 1 month apart   HERNIA REPAIR  2015   Umbilical   LEFT HEART CATH AND CORONARY ANGIOGRAPHY N/A 03/16/2020   Procedure: LEFT HEART CATH AND CORONARY ANGIOGRAPHY;  Surgeon: Millicent Ally, MD;  Location: [facility];  Service: Cardiovascular;  Laterality: N/A;   PILONIDAL CYST EXCISION  1979   TOTAL KNEE ARTHROPLASTY Left 03/08/2021   Procedure: TOTAL KNEE ARTHROPLASTY;  Surgeon: Liliane Rei, MD;  Location: [facility];  Service: Orthopedics;  Laterality: Left;    TOTAL KNEE ARTHROPLASTY Right 11/29/2021   Procedure: TOTAL KNEE ARTHROPLASTY;  Surgeon: Liliane Rei, MD;  Location: [facility];  Service: Orthopedics;  Laterality: Right;    Current Outpatient Medications:    NEOMYCIN-POLYMYXIN-HYDROCORTISONE (CORTISPORIN) 1 % SOLN OTIC solution, Apply 1-2 drops to toe BID after soaking, Disp: 10 mL, Rfl: 1   amLODipine  (NORVASC ) 5 MG tablet, Take 1 tablet (5 mg total) by mouth daily.,  Disp: 90 tablet, Rfl: 3   atorvastatin  (LIPITOR) 80 MG tablet, Take 1 tablet by mouth once daily, Disp: 30 tablet, Rfl: 0   chlorthalidone  (HYGROTON ) 25 MG tablet, Take 1 tablet by mouth once daily, Disp: 15 tablet, Rfl: 0   ezetimibe  (ZETIA ) 10 MG tablet, Take 1 tablet (10 mg total) by mouth daily., Disp: 90 tablet, Rfl: 3   isosorbide  mononitrate (IMDUR ) 30 MG 24 hr tablet, Take 1 tablet (30 mg total) by mouth daily., Disp: 90 tablet, Rfl: 3   losartan  (COZAAR ) 50 MG tablet, Take 1 tablet by mouth twice daily, Disp: 180 tablet, Rfl: 2   metFORMIN (GLUCOPHAGE-XR) 500 MG 24 hr tablet, Take 500-1,000 mg by mouth See admin instructions. Take 500 mg in the morning and 1000 mg in the evening, Disp: , Rfl:    methocarbamol  (ROBAXIN ) 500 MG tablet, Take 1 tablet (500 mg total) by mouth every 6 (six) hours as needed for muscle spasms., Disp: 40 tablet, Rfl: 0   metoprolol  succinate (TOPROL -XL) 50 MG 24 hr tablet, Take 50 mg by mouth daily. Take with or immediately following a meal., Disp: , Rfl:    nitroGLYCERIN  (NITROSTAT ) 0.4 MG SL tablet, Place 0.4 mg under the tongue every 5 (five) minutes as needed for chest pain., Disp: , Rfl:    oxyCODONE  (OXY IR/ROXICODONE ) 5 MG immediate release tablet, Take  1-2 tablets (5-10 mg total) by mouth every 6 (six) hours as needed for severe pain. Not to exceed 6 tablets a day., Disp: 42 tablet, Rfl: 0   senna (SENOKOT) 8.6 MG TABS tablet, Take 1 tablet by mouth daily as needed for mild constipation., Disp: , Rfl:    zolpidem  (AMBIEN  CR) 12.5 MG CR tablet, Take 12.5 mg by mouth at bedtime as needed for sleep., Disp: , Rfl:   Allergies  Allergen Reactions   Sulfa Antibiotics Hives   Sulfur Hives   Sulfur Dioxide Other (See Comments)   Review of Systems Objective:  There were no vitals filed for this visit.  General: Well developed, nourished, in no acute distress, alert and oriented x3   Dermatological: Skin is warm, dry and supple bilateral. Nails x 10 are are  all thick yellow dystrophic.  I am not sure whether this is mycosis or just nail dystrophy.  Hallux left demonstrates granuloma and distal onycholysis with drainage remaining integument appears unremarkable at this time. There are no open sores, no preulcerative lesions, no rash or signs of infection present.  Vascular: Dorsalis Pedis artery and Posterior Tibial artery pedal pulses are 2/4 bilateral with immedate capillary fill time. Pedal hair growth present. No varicosities and no lower extremity edema present bilateral.   Neruologic: Grossly intact via light touch bilateral. Vibratory intact via tuning fork bilateral. Protective threshold with Semmes Wienstein monofilament intact to all pedal sites bilateral. Patellar and Achilles deep tendon reflexes 2+ bilateral. No Babinski or clonus noted bilateral.   Musculoskeletal: No gross boney pedal deformities bilateral. No pain, crepitus, or limitation noted with foot and ankle range of motion bilateral. Muscular strength 5/5 in all groups tested bilateral.  Gait: Unassisted, Nonantalgic.    Radiographs:  None taken  Assessment & Plan:   Assessment: Nail dystrophy bilateral.  Ingrown nail paronychia hallux left.  Plan: As of the nail were taken today to be sent for pathologic evaluation.  Hallux left was localized with anesthetic prepped and draped.  The nail was avulsed and all necrotic tissue was sharply resected.  There appeared to be a laceration centrally in the nailbed.  Dressed a compressive dressing was applied was given both oral and home-going structure for the care of soaking the toe we will follow-up with him in a month sooner if needed.     John Wilson T. [place], [place]

## 2024-05-13 ENCOUNTER — Ambulatory Visit: Payer: Self-pay | Admitting: Podiatry

## 2024-05-14 ENCOUNTER — Other Ambulatory Visit: Payer: Self-pay | Admitting: Physician Assistant

## 2024-05-14 ENCOUNTER — Other Ambulatory Visit: Payer: Self-pay | Admitting: Cardiology

## 2024-05-14 DIAGNOSIS — I10 Essential (primary) hypertension: Secondary | ICD-10-CM

## 2024-05-28 ENCOUNTER — Ambulatory Visit: Admitting: Podiatry

## 2024-05-28 ENCOUNTER — Encounter: Payer: Self-pay | Admitting: Podiatry

## 2024-05-28 DIAGNOSIS — B351 Tinea unguium: Secondary | ICD-10-CM

## 2024-05-28 DIAGNOSIS — L03032 Cellulitis of left toe: Secondary | ICD-10-CM

## 2024-05-28 DIAGNOSIS — L603 Nail dystrophy: Secondary | ICD-10-CM

## 2024-05-28 DIAGNOSIS — M79676 Pain in unspecified toe(s): Secondary | ICD-10-CM

## 2024-05-28 MED ORDER — TERBINAFINE HCL 250 MG PO TABS: 250.0000 mg | ORAL_TABLET | Freq: Every day | ORAL | 0 refills | Status: DC

## 2024-05-28 NOTE — Progress Notes (Signed)
 He presents today for follow-up of his nail check left foot big toe.  He states he is doing fine no problems.  He is also here for his nail pathology results.  Objective: Hallux nail left is healing just fine no problems whatsoever no erythema edema salines drainage or odor.  His nail samples did reveal that not only does he have nail dystrophy but he also has Trichophyton rubrum and also a mold.  Assessment: Well-healing surgical toe hallux left and onychomycosis with nail dystrophy.  Plan: Discussed the pros and cons of topical therapy laser therapy and oral therapy.  He like to try oral therapy at this point.  His previous liver function tests look good he does have some elevated kidney numbers which we will continue to watch carefully.  I will provide him with 30 days of Lamisil  250 mg tablets.  He will take 1 tablet daily I will follow-up with him in 30 days for blood work consisting of a comprehensive metabolic panel evaluating not only his liver function but his kidney function is well.  Also she having problems taking medication he will give us  a call.  I also debrided his nails 1 through 5 bilateral due to their thickness and pain.

## 2024-06-07 ENCOUNTER — Other Ambulatory Visit: Payer: Self-pay | Admitting: Physician Assistant

## 2024-06-11 ENCOUNTER — Other Ambulatory Visit: Payer: Self-pay | Admitting: Cardiology

## 2024-06-11 ENCOUNTER — Other Ambulatory Visit: Payer: Self-pay | Admitting: Physician Assistant

## 2024-06-11 DIAGNOSIS — I10 Essential (primary) hypertension: Secondary | ICD-10-CM

## 2024-06-13 ENCOUNTER — Other Ambulatory Visit: Payer: Self-pay | Admitting: Cardiology

## 2024-06-13 DIAGNOSIS — I10 Essential (primary) hypertension: Secondary | ICD-10-CM

## 2024-06-24 ENCOUNTER — Other Ambulatory Visit: Payer: Self-pay | Admitting: Podiatry

## 2024-06-24 ENCOUNTER — Other Ambulatory Visit: Payer: Self-pay | Admitting: Cardiology

## 2024-07-02 ENCOUNTER — Encounter: Payer: Self-pay | Admitting: Podiatry

## 2024-07-02 ENCOUNTER — Ambulatory Visit (INDEPENDENT_AMBULATORY_CARE_PROVIDER_SITE_OTHER): Admitting: Podiatry

## 2024-07-02 DIAGNOSIS — L603 Nail dystrophy: Secondary | ICD-10-CM | POA: Diagnosis not present

## 2024-07-02 DIAGNOSIS — Z79899 Other long term (current) drug therapy: Secondary | ICD-10-CM

## 2024-07-02 MED ORDER — TERBINAFINE HCL 250 MG PO TABS: 250.0000 mg | ORAL_TABLET | Freq: Every day | ORAL | 0 refills | Status: DC

## 2024-07-02 NOTE — Progress Notes (Signed)
 He presents today after taking about a month and a half of his Lamisil .  He states that he is doing really well with that denies fever chills nausea mobic muscle aches pains calf pain back pain chest pain shortness of breath rash or itch.  He states he can see a color change in the toenails already.  Objective: No change physical exam.  Assessment: Long-term therapy with Lamisil .  Plan: At this point we will going to prescribe another 60 pills and requested a comprehensive metabolic panel.  Should this blood work come back abnormal I will notify him immediately.  I will follow-up with him in 3 months

## 2024-07-03 ENCOUNTER — Ambulatory Visit: Payer: Self-pay | Admitting: Podiatry

## 2024-07-03 LAB — COMPREHENSIVE METABOLIC PANEL WITH GFR
AG Ratio: 2 (calc) (ref 1.0–2.5)
ALT: 27 U/L (ref 9–46)
AST: 30 U/L (ref 10–35)
Albumin: 4.5 g/dL (ref 3.6–5.1)
Alkaline phosphatase (APISO): 57 U/L (ref 35–144)
BUN/Creatinine Ratio: 22 (calc) (ref 6–22)
BUN: 33 mg/dL — ABNORMAL HIGH (ref 7–25)
CO2: 26 mmol/L (ref 20–32)
Calcium: 9.6 mg/dL (ref 8.6–10.3)
Chloride: 101 mmol/L (ref 98–110)
Creat: 1.51 mg/dL — ABNORMAL HIGH (ref 0.70–1.28)
Globulin: 2.3 g/dL (ref 1.9–3.7)
Glucose, Bld: 95 mg/dL (ref 65–139)
Potassium: 3.7 mmol/L (ref 3.5–5.3)
Sodium: 140 mmol/L (ref 135–146)
Total Bilirubin: 0.8 mg/dL (ref 0.2–1.2)
Total Protein: 6.8 g/dL (ref 6.1–8.1)
eGFR: 48 mL/min/1.73m2 — ABNORMAL LOW (ref >=60)

## 2024-07-05 NOTE — Progress Notes (Signed)
 Left message to call office back patient out of town.

## 2024-07-29 ENCOUNTER — Other Ambulatory Visit: Payer: Self-pay | Admitting: Cardiology

## 2024-07-29 MED ORDER — ATORVASTATIN CALCIUM 80 MG PO TABS: 80.0000 mg | ORAL_TABLET | Freq: Every day | ORAL | 1 refills | Status: DC

## 2024-08-06 ENCOUNTER — Other Ambulatory Visit: Payer: Self-pay | Admitting: Cardiology

## 2024-08-06 ENCOUNTER — Telehealth: Payer: Self-pay | Admitting: Podiatry

## 2024-08-06 DIAGNOSIS — I10 Essential (primary) hypertension: Secondary | ICD-10-CM

## 2024-08-06 NOTE — Telephone Encounter (Signed)
 Patient states that he was told to get his blood rechecked. And he would like our office to set up the appointment for him . What are the next steps?

## 2024-08-08 ENCOUNTER — Other Ambulatory Visit: Payer: Self-pay | Admitting: Lab

## 2024-08-08 DIAGNOSIS — Z79899 Other long term (current) drug therapy: Secondary | ICD-10-CM

## 2024-08-19 ENCOUNTER — Ambulatory Visit: Attending: Cardiology | Admitting: Cardiology

## 2024-08-19 ENCOUNTER — Encounter: Payer: Self-pay | Admitting: Cardiology

## 2024-08-19 VITALS — BP 157/85 | HR 56 | Ht 70.0 in | Wt 255.6 lb

## 2024-08-19 DIAGNOSIS — I25118 Atherosclerotic heart disease of native coronary artery with other forms of angina pectoris: Secondary | ICD-10-CM

## 2024-08-19 DIAGNOSIS — I209 Angina pectoris, unspecified: Secondary | ICD-10-CM

## 2024-08-19 DIAGNOSIS — E1159 Type 2 diabetes mellitus with other circulatory complications: Secondary | ICD-10-CM | POA: Diagnosis not present

## 2024-08-19 DIAGNOSIS — I10 Essential (primary) hypertension: Secondary | ICD-10-CM | POA: Diagnosis not present

## 2024-08-19 DIAGNOSIS — I2510 Atherosclerotic heart disease of native coronary artery without angina pectoris: Secondary | ICD-10-CM

## 2024-08-19 MED ORDER — AMLODIPINE BESYLATE 5 MG PO TABS: 5.0000 mg | ORAL_TABLET | Freq: Every day | ORAL | 3 refills | Status: DC

## 2024-08-19 MED ORDER — METOPROLOL SUCCINATE ER 50 MG PO TB24: 50.0000 mg | ORAL_TABLET | Freq: Every day | ORAL | 3 refills | Status: DC

## 2024-08-19 MED ORDER — CHLORTHALIDONE 25 MG PO TABS
25.0000 mg | ORAL_TABLET | Freq: Every day | ORAL | 3 refills | Status: AC
Start: 2024-08-19 — End: ?

## 2024-08-19 MED ORDER — EZETIMIBE 10 MG PO TABS: 10.0000 mg | ORAL_TABLET | Freq: Every day | ORAL | 3 refills | Status: AC

## 2024-08-19 MED ORDER — ATORVASTATIN CALCIUM 80 MG PO TABS: 80.0000 mg | ORAL_TABLET | Freq: Every day | ORAL | 3 refills | Status: AC

## 2024-08-19 MED ORDER — ISOSORBIDE MONONITRATE ER 30 MG PO TB24: 30.0000 mg | ORAL_TABLET | Freq: Every day | ORAL | 3 refills | Status: AC

## 2024-08-19 NOTE — Progress Notes (Signed)
 Cardiology Office Note:  .   Date:  08/19/2024  ID:  John Wilson, DOB 07-01-1946, MRN [id number] PCP: Frederik Charleston, MD  [facility] Providers Cardiologist:  Oneil Parchment, MD     History of Present Illness: .   John Wilson is a 78 y.o. male Discussed the use of AI scribe software   History of Present Illness John Wilson is a 78 year old male with coronary artery disease who presents for follow-up.  He has a history of coronary artery disease with a bare metal stent placed in the right coronary artery in 2003 and a prior stent in the obtuse marginal one. His last heart catheterization on Mar 16, 2020, showed patent stents and medical management for a 70% distal RCA stenosis due to tortuosity. He is currently on isosorbide  and metoprolol  for anti-anginal management.  He has hypertension with fluctuations in blood pressure. His medication was previously adjusted from 100 mg to 50 mg of losartan  twice a day to manage low blood pressure. He monitors his blood pressure twice daily and is currently on amlodipine  5 mg, chlorthalidone  25 mg, losartan  50 mg twice a day, and metoprolol  50 mg.  He has diabetes with hyperlipidemia and is on atorvastatin  80 mg and ezetimibe  10 mg for cholesterol management. Recent lab results show an LDL of 46, triglycerides of 98, and an A1c of 6.7. He discontinued a medication that was affecting his diabetes control.  He experiences frequent urination and incontinence, for which he was treated with antibiotics for a kidney infection. Despite treatment, he continues to experience frequent urination.  He has gained 10 pounds over the last six to seven months, attributed to decreased physical activity and increased computer use. He plans to increase his activity with the onset of hunting season and reduce his intake of sweets.  He was recently diagnosed with cataracts after experiencing vision issues despite new glasses.      ROS: No CP, no  SOB  Studies Reviewed: SABRA   EKG Interpretation Date/Time:  Monday August 19 2024 09:24:08 EDT Ventricular Rate:  56 PR Interval:  180 QRS Duration:  110 QT Interval:  442 QTC Calculation: 426 R Axis:   -22  Text Interpretation: Sinus bradycardia Incomplete left bundle branch block Minimal voltage criteria for LVH, may be normal variant ( R in aVL ) No previous ECGs available Confirmed by Parchment Oneil ([id number]) on 08/19/2024 10:02:19 AM    Results LABS LDL: 46 (05/21/2024) Triglycerides: 98 (05/21/2024) A1c: 6.7 (05/21/2024) Creatinine: 1.5 (05/21/2024)  DIAGNOSTIC Electrocardiogram: Normal Risk Assessment/Calculations:           Physical Exam:   VS:  BP (!) 157/85   Pulse (!) 56   Ht 5' 10 (1.778 m)   Wt 255 lb 9.6 oz (115.9 kg)   SpO2 94%   BMI 36.67 kg/m    Wt Readings from Last 3 Encounters:  08/19/24 255 lb 9.6 oz (115.9 kg)  02/22/23 253 lb 6.4 oz (114.9 kg)  11/29/21 250 lb 3.6 oz (113.5 kg)    GEN: Well nourished, well developed in no acute distress NECK: No JVD; No carotid bruits CARDIAC: RRR, no murmurs, no rubs, no gallops RESPIRATORY:  Clear to auscultation without rales, wheezing or rhonchi  ABDOMEN: Soft, non-tender, non-distended EXTREMITIES:  No edema; No deformity   ASSESSMENT AND PLAN: .    Assessment and Plan Assessment & Plan Coronary artery disease with prior stents Coronary artery disease with prior stents in the right coronary artery and obtuse  marginal one. Last heart catheterization on Mar 16, 2020, showed patent stents and 70% stenosis in the distal RCA, managed medically due to tortuosity making PCI difficult. - Continue isosorbide  for medical management and anti-anginal effects - Continue metoprolol  for anti-anginal effects  Essential hypertension Fluctuating blood pressure managed with amlodipine , chlorthalidone , and losartan . Losartan  adjusted from 100 mg once daily to 50 mg twice daily to manage blood pressure effectively without  adverse effects on diabetes. - Continue amlodipine  5 mg a day, chlorthalidone  25 mg a day, isosorbide  30 mg a day, losartan  50 mg twice a day, Toprol -XL 50 mg once a day - His pressures have been excellent at home in the 120s to 130s range  Type 2 diabetes mellitus Type 2 diabetes mellitus with recent A1c of 6.7, indicating good control. Adjustments in hypertension medication were made to avoid adverse effects on diabetes management.  Hyperlipidemia Hyperlipidemia managed with atorvastatin  and ezetimibe . Recent lipid panel shows LDL of 46 and triglycerides of 98, indicating excellent control. Atorvastatin  was increased from 40 mg to 80 mg based on prior blood work results. - Continue atorvastatin  80 mg daily - Continue ezetimibe  10 mg daily  Chronic kidney disease 3a Chronic kidney disease with a creatinine level of 1.5, indicating slightly reduced kidney function. Avoid NSAIDs  Urinary frequency and incontinence Recent urinary frequency and incontinence with a history of a small kidney infection treated with antibiotics. Symptoms of frequent urination persist. - Refer to urologist for evaluation of urinary frequency and incontinence       1 yr  Signed, Oneil Parchment, MD

## 2024-08-19 NOTE — Patient Instructions (Signed)
 Medication Instructions:  Your physician recommends that you continue on your current medications as directed. Please refer to the Current Medication list given to you today.  *If you need a refill on your cardiac medications before your next appointment, please call your pharmacy*  Lab Work: NONE ordered at this time of appointment   Testing/Procedures: NONE ordered at this time of appointment   Follow-Up: At [facility], you and your health needs are our priority.  As part of our continuing mission to provide you with exceptional heart care, our providers are all part of one team.  This team includes your primary Cardiologist (physician) and Advanced Practice Providers or APPs (Physician Assistants and Nurse Practitioners) who all work together to provide you with the care you need, when you need it.  Your next appointment:   1 year(s)  Provider:   Oneil Parchment, MD    We recommend signing up for the patient portal called MyChart.  Sign up information is provided on this After Visit Summary.  MyChart is used to connect with patients for Virtual Visits (Telemedicine).  Patients are able to view lab/test results, encounter notes, upcoming appointments, etc.  Non-urgent messages can be sent to your provider as well.   To learn more about what you can do with MyChart, go to [email or web address].

## 2024-09-02 ENCOUNTER — Other Ambulatory Visit: Payer: Self-pay | Admitting: Cardiology

## 2024-09-02 DIAGNOSIS — I10 Essential (primary) hypertension: Secondary | ICD-10-CM

## 2024-09-05 ENCOUNTER — Telehealth: Payer: Self-pay | Admitting: Cardiology

## 2024-09-05 NOTE — Telephone Encounter (Signed)
 Pt c/o medication issue:  1. Name of Medication:   metoprolol  succinate (TOPROL -XL) 50 MG 24 hr tablet    2. How are you currently taking this medication (dosage and times per day)?   3. Are you having a reaction (difficulty breathing--STAT)? No   4. What is your medication issue? Pt spouse called in stating this medication should be for 25 mg tablets twice daily. Please advise.

## 2024-09-05 NOTE — Telephone Encounter (Signed)
 Called pt. No answer, unable to leave a message. Mailbox is full.

## 2024-09-09 ENCOUNTER — Telehealth: Payer: Self-pay | Admitting: Cardiology

## 2024-09-09 ENCOUNTER — Encounter: Payer: Self-pay | Admitting: Podiatry

## 2024-09-09 NOTE — Telephone Encounter (Signed)
 The patient called to report that Amlodipine  was d/c'd by his PCP and Metoprolol  Succinate was decreased to 25 mg daily.  Med list updated. Will send updates to the provider.

## 2024-09-09 NOTE — Telephone Encounter (Signed)
 Pt c/o medication issue:  1. Name of Medication:   metoprolol  succinate (TOPROL -XL) 50 MG 24 hr tablet    2. How are you currently taking this medication (dosage and times per day)?    3. Are you having a reaction (difficulty breathing--STAT)? no  4. What is your medication issue? Patient states that he PCP is now prescribing him this medication at a lower dosage. He has change it from 50mg  to 25mg  because of BP so low. Please advise

## 2024-09-10 NOTE — Telephone Encounter (Signed)
 Left message to call back to discuss.  Documentation in the chart demonstrates pt is to be taking Metoprolol  Succinate 50 mg once daily.  Wife is stating he takes 25 mg twice daily.  Need to verify if another provider has changed doses.

## 2024-09-11 ENCOUNTER — Ambulatory Visit: Payer: Self-pay | Admitting: Podiatry

## 2024-09-11 ENCOUNTER — Telehealth: Payer: Self-pay | Admitting: Lab

## 2024-09-11 NOTE — Telephone Encounter (Signed)
 Patient states is in need refill on Lamisil  medication.

## 2024-09-11 NOTE — Progress Notes (Signed)
 Left message with wife

## 2024-09-12 NOTE — Telephone Encounter (Signed)
 Davee Comer CROME, RN    09/09/24  2:44 PM Note The patient called to report that Amlodipine  was d/c'd by his PCP and Metoprolol  Succinate was decreased to 25 mg daily.   Med list updated. Will send updates to the provider.

## 2024-10-03 ENCOUNTER — Ambulatory Visit: Admitting: Podiatry

## 2024-10-03 ENCOUNTER — Encounter: Payer: Self-pay | Admitting: Podiatry

## 2024-10-03 DIAGNOSIS — B351 Tinea unguium: Secondary | ICD-10-CM | POA: Diagnosis not present

## 2024-10-03 DIAGNOSIS — M79676 Pain in unspecified toe(s): Secondary | ICD-10-CM

## 2024-10-03 DIAGNOSIS — L603 Nail dystrophy: Secondary | ICD-10-CM

## 2024-10-03 MED ORDER — TERBINAFINE HCL 250 MG PO TABS
250.0000 mg | ORAL_TABLET | Freq: Every day | ORAL | 0 refills | Status: AC
Start: 2024-10-03 — End: ?

## 2024-10-03 NOTE — Progress Notes (Signed)
 He presents today to follow-up for his onychomycosis states that since have been taking the Lamisil  the toenails are starting to lighten up.  They are still thick as to have a lot of skin issues but it is getting better.  Objective: Vital signs are stable alert oriented x 3 denies any problems taking the medication.  Toenails are thick yellow dystrophic and mycotic.  Assessment: Debrided the nails in thickness and the length for him today long-term therapy with Lamisil  for onychomycosis.  Plan: Debrided nails in thickness and length today for painful onychomycosis and dispensed prescription for blood work as well as another 60 tablets of Lamisil  he has 30 tablets left at home.  Follow-up with him in 4 months months

## 2025-02-04 ENCOUNTER — Ambulatory Visit: Admitting: Podiatry
